# Patient Record
Sex: Male | Born: 1985
Health system: Southern US, Community
[De-identification: ages and names within clinical notes are randomized; demographics above are authoritative.]

## PROBLEM LIST (undated history)

## (undated) DIAGNOSIS — I10 Essential (primary) hypertension: Secondary | ICD-10-CM

---

## 2008-02-12 ENCOUNTER — Ambulatory Visit: Payer: Self-pay | Admitting: General Practice

## 2012-04-12 ENCOUNTER — Ambulatory Visit: Payer: Self-pay | Admitting: General Practice

## 2014-07-30 ENCOUNTER — Encounter: Payer: Self-pay | Admitting: Emergency Medicine

## 2014-07-30 DIAGNOSIS — M549 Dorsalgia, unspecified: Secondary | ICD-10-CM | POA: Diagnosis not present

## 2014-07-30 DIAGNOSIS — R109 Unspecified abdominal pain: Secondary | ICD-10-CM | POA: Diagnosis present

## 2014-07-30 DIAGNOSIS — N200 Calculus of kidney: Secondary | ICD-10-CM | POA: Diagnosis not present

## 2014-07-30 LAB — URINALYSIS COMPLETE WITH MICROSCOPIC (ARMC ONLY)
Bacteria, UA: NONE SEEN
Bilirubin Urine: NEGATIVE
Glucose, UA: NEGATIVE mg/dL
Ketones, ur: NEGATIVE mg/dL
Leukocytes, UA: NEGATIVE
NITRITE: NEGATIVE
PROTEIN: NEGATIVE mg/dL
Specific Gravity, Urine: 1.018 (ref 1.005–1.030)
Squamous Epithelial / LPF: NONE SEEN
pH: 7 (ref 5.0–8.0)

## 2014-07-30 NOTE — ED Notes (Signed)
Pt ambulatory to triage, with no difficulty or distress noted; Pt reports awoke with pain to left lower back, nonradiating with no accomp symptoms; denies any injury; denies hx of same

## 2014-07-31 ENCOUNTER — Emergency Department
Admission: EM | Admit: 2014-07-31 | Discharge: 2014-07-31 | Disposition: A | Discharge status: Home / Self Care | Payer: BLUE CROSS/BLUE SHIELD | Attending: Emergency Medicine | Admitting: Emergency Medicine

## 2014-07-31 ENCOUNTER — Encounter: Payer: Self-pay | Admitting: Emergency Medicine

## 2014-07-31 ENCOUNTER — Emergency Department: Payer: BLUE CROSS/BLUE SHIELD

## 2014-07-31 DIAGNOSIS — R109 Unspecified abdominal pain: Secondary | ICD-10-CM

## 2014-07-31 DIAGNOSIS — N200 Calculus of kidney: Secondary | ICD-10-CM

## 2014-07-31 LAB — BASIC METABOLIC PANEL
Anion gap: 8 (ref 5–15)
BUN: 19 mg/dL (ref 6–20)
CALCIUM: 8.8 mg/dL — AB (ref 8.9–10.3)
CO2: 26 mmol/L (ref 22–32)
Chloride: 103 mmol/L (ref 101–111)
Creatinine, Ser: 1.17 mg/dL (ref 0.61–1.24)
GFR calc non Af Amer: >60 mL/min (ref >60)
Glucose, Bld: 94 mg/dL (ref 65–99)
POTASSIUM: 3.5 mmol/L (ref 3.5–5.1)
Sodium: 137 mmol/L (ref 135–145)

## 2014-07-31 LAB — CBC
HCT: 43.2 % (ref 40.0–52.0)
Hemoglobin: 14.5 g/dL (ref 13.0–18.0)
MCH: 31.2 pg (ref 26.0–34.0)
MCHC: 33.5 g/dL (ref 32.0–36.0)
MCV: 93.1 fL (ref 80.0–100.0)
Platelets: 253 10*3/uL (ref 150–440)
RBC: 4.65 MIL/uL (ref 4.40–5.90)
RDW: 12.3 % (ref 11.5–14.5)
WBC: 6.3 10*3/uL (ref 3.8–10.6)

## 2014-07-31 MED ORDER — TAMSULOSIN HCL 0.4 MG PO CAPS: 0.4000 mg | ORAL_CAPSULE | Freq: Every day | ORAL | Status: DC

## 2014-07-31 MED ORDER — TAMSULOSIN HCL 0.4 MG PO CAPS
ORAL_CAPSULE | ORAL | Status: AC
  Administered 2014-07-31: 0.4 mg via ORAL
  Filled 2014-07-31: qty 1

## 2014-07-31 MED ORDER — KETOROLAC TROMETHAMINE 10 MG PO TABS: 10.0000 mg | ORAL_TABLET | Freq: Three times a day (TID) | ORAL | Status: DC | PRN

## 2014-07-31 MED ORDER — KETOROLAC TROMETHAMINE 10 MG PO TABS
10.0000 mg | ORAL_TABLET | Freq: Once | ORAL | Status: AC
  Administered 2014-07-31: 10 mg via ORAL

## 2014-07-31 MED ORDER — KETOROLAC TROMETHAMINE 10 MG PO TABS
ORAL_TABLET | ORAL | Status: AC
  Administered 2014-07-31: 10 mg via ORAL
  Filled 2014-07-31: qty 1

## 2014-07-31 NOTE — Discharge Instructions (Signed)

## 2014-07-31 NOTE — ED Provider Notes (Signed)
[facility] Emergency Department Provider Note  ____________________________________________  Time seen: 3:00PM  I have reviewed the triage vital signs and the nursing notes.   HISTORY  Chief Complaint Back Pain      HPI Jeffrey Maynard is a 29 y.o. male   presents with acute onset of left flank pain that awoke him from sleep tonight. Patient denies no vomiting no diarrhea no fever. Of note patient has no history of kidney stones in the past. Patient also denies hematuria. Patient pain-free at present     History reviewed. No pertinent past medical history.  There are no active problems to display for this patient.   History reviewed. No pertinent past surgical history.  No current outpatient prescriptions on file.  Allergies Review of patient's allergies indicates no known allergies.  No family history on file.  Social History History  Substance Use Topics  . Smoking status: Never Smoker   . Smokeless tobacco: Not on file  . Alcohol Use: No    Review of Systems  Constitutional: Negative for fever. Eyes: Negative for visual changes. ENT: Negative for sore throat. Cardiovascular: Negative for chest pain. Respiratory: Negative for shortness of breath. Gastrointestinal: Negative for abdominal pain, vomiting and diarrhea. Genitourinary: Negative for dysuria. Musculoskeletal: Positive for left back pain. Skin: Negative for rash. Neurological: Negative for headaches, focal weakness or numbness.   10-point ROS otherwise negative.  ____________________________________________   PHYSICAL EXAM:  VITAL SIGNS: ED Triage Vitals  Enc Vitals Group     BP 07/30/14 2331 143/97 mmHg     Pulse Rate 07/30/14 2331 56     Resp 07/30/14 2331 20     Temp 07/30/14 2331 98 F (36.7 C)     Temp src --      SpO2 07/31/14 0230 100 %     Weight 07/30/14 2331 195 lb (88.451 kg)     Height 07/30/14 2331 5\' 9"  (1.753 m)     Head Cir --    Peak Flow --      Pain Score 07/30/14 2332 6     Pain Loc --      Pain Edu? --      Excl. in [place]? --      Constitutional: Alert and oriented. Well appearing and in no distress. Eyes: Conjunctivae are normal. PERRL. Normal extraocular movements. ENT   Head: Normocephalic and atraumatic.   Nose: No congestion/rhinnorhea.   Mouth/Throat: Mucous membranes are moist.   Neck: No stridor. Cardiovascular: Normal rate, regular rhythm. Normal and symmetric distal pulses are present in all extremities. No murmurs, rubs, or gallops. Respiratory: Normal respiratory effort without tachypnea nor retractions. Breath sounds are clear and equal bilaterally. No wheezes/rales/rhonchi. Gastrointestinal: Soft and nontender. No distention. There is no CVA tenderness. Genitourinary: deferred Musculoskeletal: Nontender with normal range of motion in all extremities. No joint effusions.  No lower extremity tenderness nor edema. Neurologic:  Normal speech and language. No gross focal neurologic deficits are appreciated. Speech is normal.  Skin:  Skin is warm, dry and intact. No rash noted. Psychiatric: Mood and affect are normal. Speech and behavior are normal. Patient exhibits appropriate insight and judgment.  ____________________________________________    LABS (pertinent positives/negatives)  Labs Reviewed  URINALYSIS COMPLETEWITH MICROSCOPIC ([place] ONLY) - Abnormal; Notable for the following:    Color, Urine YELLOW (*)    APPearance CLEAR (*)    Hgb urine dipstick 2+ (*)    All other components within normal limits  BASIC METABOLIC PANEL -  Abnormal; Notable for the following:    Calcium 8.8 (*)    All other components within normal limits  CBC       RADIOLOGY CT scan revealed a 3 mm left UPJ kidney stone  ____________________________________________      INITIAL IMPRESSION / ASSESSMENT AND PLAN / ED COURSE  Pertinent labs & imaging results that were available during my  care of the patient were reviewed by me and considered in my medical decision making (see chart for details).  History,  physical exam and CT scan consistent with left UPJ kidney stone patient received Toradol and Flomax in the emergency department patient pain-free at present  ____________________________________________   FINAL CLINICAL IMPRESSION(S) / ED DIAGNOSES  Final diagnoses:  Flank pain      Gregor Hams, MD 07/31/14 [phone]

## 2014-07-31 NOTE — ED Notes (Signed)
Noted UA results; Dr Dahlia Client notified and further orders obtained; acuity level changed

## 2019-05-21 ENCOUNTER — Inpatient Hospital Stay: Payer: 59

## 2019-05-21 ENCOUNTER — Other Ambulatory Visit: Payer: Self-pay

## 2019-05-21 ENCOUNTER — Emergency Department: Payer: 59

## 2019-05-21 ENCOUNTER — Encounter: Payer: Self-pay | Admitting: Emergency Medicine

## 2019-05-21 ENCOUNTER — Inpatient Hospital Stay
Admission: EM | Admit: 2019-05-21 | Discharge: 2019-05-25 | DRG: 673 | Disposition: A | Discharge status: Home / Self Care | Payer: 59 | Source: Ambulatory Visit | Attending: Internal Medicine | Admitting: Internal Medicine

## 2019-05-21 DIAGNOSIS — I120 Hypertensive chronic kidney disease with stage 5 chronic kidney disease or end stage renal disease: Principal | ICD-10-CM | POA: Diagnosis present

## 2019-05-21 DIAGNOSIS — I161 Hypertensive emergency: Secondary | ICD-10-CM | POA: Diagnosis not present

## 2019-05-21 DIAGNOSIS — R319 Hematuria, unspecified: Secondary | ICD-10-CM

## 2019-05-21 DIAGNOSIS — E876 Hypokalemia: Secondary | ICD-10-CM | POA: Diagnosis not present

## 2019-05-21 DIAGNOSIS — Z20822 Contact with and (suspected) exposure to covid-19: Secondary | ICD-10-CM | POA: Diagnosis present

## 2019-05-21 DIAGNOSIS — D631 Anemia in chronic kidney disease: Secondary | ICD-10-CM | POA: Diagnosis not present

## 2019-05-21 DIAGNOSIS — R778 Other specified abnormalities of plasma proteins: Secondary | ICD-10-CM | POA: Diagnosis not present

## 2019-05-21 DIAGNOSIS — Z825 Family history of asthma and other chronic lower respiratory diseases: Secondary | ICD-10-CM | POA: Diagnosis not present

## 2019-05-21 DIAGNOSIS — F419 Anxiety disorder, unspecified: Secondary | ICD-10-CM | POA: Diagnosis present

## 2019-05-21 DIAGNOSIS — G4700 Insomnia, unspecified: Secondary | ICD-10-CM | POA: Diagnosis present

## 2019-05-21 DIAGNOSIS — N186 End stage renal disease: Secondary | ICD-10-CM | POA: Diagnosis not present

## 2019-05-21 DIAGNOSIS — Z992 Dependence on renal dialysis: Secondary | ICD-10-CM | POA: Diagnosis not present

## 2019-05-21 DIAGNOSIS — N200 Calculus of kidney: Secondary | ICD-10-CM | POA: Diagnosis present

## 2019-05-21 DIAGNOSIS — I10 Essential (primary) hypertension: Secondary | ICD-10-CM | POA: Diagnosis not present

## 2019-05-21 DIAGNOSIS — R9431 Abnormal electrocardiogram [ECG] [EKG]: Secondary | ICD-10-CM | POA: Diagnosis not present

## 2019-05-21 DIAGNOSIS — N179 Acute kidney failure, unspecified: Secondary | ICD-10-CM | POA: Diagnosis not present

## 2019-05-21 DIAGNOSIS — I248 Other forms of acute ischemic heart disease: Secondary | ICD-10-CM | POA: Diagnosis present

## 2019-05-21 DIAGNOSIS — E871 Hypo-osmolality and hyponatremia: Secondary | ICD-10-CM | POA: Diagnosis present

## 2019-05-21 DIAGNOSIS — R809 Proteinuria, unspecified: Secondary | ICD-10-CM | POA: Diagnosis not present

## 2019-05-21 DIAGNOSIS — D696 Thrombocytopenia, unspecified: Secondary | ICD-10-CM

## 2019-05-21 DIAGNOSIS — N2581 Secondary hyperparathyroidism of renal origin: Secondary | ICD-10-CM | POA: Diagnosis not present

## 2019-05-21 DIAGNOSIS — R030 Elevated blood-pressure reading, without diagnosis of hypertension: Secondary | ICD-10-CM | POA: Diagnosis not present

## 2019-05-21 DIAGNOSIS — R519 Headache, unspecified: Secondary | ICD-10-CM | POA: Diagnosis not present

## 2019-05-21 HISTORY — DX: Essential (primary) hypertension: I10

## 2019-05-21 LAB — BASIC METABOLIC PANEL
Anion gap: 13 (ref 5–15)
BUN: 52 mg/dL — ABNORMAL HIGH (ref 6–20)
CO2: 25 mmol/L (ref 22–32)
Calcium: 8.9 mg/dL (ref 8.9–10.3)
Chloride: 96 mmol/L — ABNORMAL LOW (ref 98–111)
Creatinine, Ser: 5.23 mg/dL — ABNORMAL HIGH (ref 0.61–1.24)
GFR calc Af Amer: 16 mL/min — ABNORMAL LOW (ref >60)
GFR calc non Af Amer: 13 mL/min — ABNORMAL LOW (ref >60)
Glucose, Bld: 109 mg/dL — ABNORMAL HIGH (ref 70–99)
Potassium: 2.6 mmol/L — CL (ref 3.5–5.1)
Sodium: 134 mmol/L — ABNORMAL LOW (ref 135–145)

## 2019-05-21 LAB — SODIUM, URINE, RANDOM: Sodium, Ur: 77 mmol/L

## 2019-05-21 LAB — URINALYSIS, ROUTINE W REFLEX MICROSCOPIC
Bilirubin Urine: NEGATIVE
Glucose, UA: 50 mg/dL — AB
Ketones, ur: NEGATIVE mg/dL
Leukocytes,Ua: NEGATIVE
Nitrite: NEGATIVE
Protein, ur: >=300 mg/dL — AB
Specific Gravity, Urine: 1.015 (ref 1.005–1.030)
pH: 6 (ref 5.0–8.0)

## 2019-05-21 LAB — CBC
HCT: 30.5 % — ABNORMAL LOW (ref 39.0–52.0)
Hemoglobin: 10.8 g/dL — ABNORMAL LOW (ref 13.0–17.0)
MCH: 30.7 pg (ref 26.0–34.0)
MCHC: 35.4 g/dL (ref 30.0–36.0)
MCV: 86.6 fL (ref 80.0–100.0)
Platelets: 68 10*3/uL — ABNORMAL LOW (ref 150–400)
RBC: 3.52 MIL/uL — ABNORMAL LOW (ref 4.22–5.81)
RDW: 13.4 % (ref 11.5–15.5)
WBC: 10.3 10*3/uL (ref 4.0–10.5)
nRBC: 0 % (ref 0.0–0.2)

## 2019-05-21 LAB — HIV ANTIBODY (ROUTINE TESTING W REFLEX): HIV Screen 4th Generation wRfx: NONREACTIVE

## 2019-05-21 LAB — HEPATITIS C ANTIBODY: HCV Ab: NONREACTIVE

## 2019-05-21 LAB — URINE DRUG SCREEN, QUALITATIVE (ARMC ONLY)
Amphetamines, Ur Screen: NOT DETECTED
Barbiturates, Ur Screen: NOT DETECTED
Benzodiazepine, Ur Scrn: NOT DETECTED
Cannabinoid 50 Ng, Ur ~~LOC~~: NOT DETECTED
Cocaine Metabolite,Ur ~~LOC~~: NOT DETECTED
MDMA (Ecstasy)Ur Screen: NOT DETECTED
Methadone Scn, Ur: NOT DETECTED
Opiate, Ur Screen: NOT DETECTED
Phencyclidine (PCP) Ur S: NOT DETECTED
Tricyclic, Ur Screen: NOT DETECTED

## 2019-05-21 LAB — PROTEIN / CREATININE RATIO, URINE
Creatinine, Urine: 177 mg/dL
Protein Creatinine Ratio: 2.88 mg/mg{Cre} — ABNORMAL HIGH (ref 0.00–0.15)
Total Protein, Urine: 510 mg/dL

## 2019-05-21 LAB — TROPONIN I (HIGH SENSITIVITY)
Troponin I (High Sensitivity): 229 ng/L (ref <18)
Troponin I (High Sensitivity): 212 ng/L (ref <18)

## 2019-05-21 LAB — PROTIME-INR
INR: 1 (ref 0.8–1.2)
Prothrombin Time: 13.4 seconds (ref 11.4–15.2)

## 2019-05-21 LAB — OSMOLALITY: Osmolality: 297 mOsm/kg — ABNORMAL HIGH (ref 275–295)

## 2019-05-21 LAB — MAGNESIUM: Magnesium: 2.1 mg/dL (ref 1.7–2.4)

## 2019-05-21 LAB — CK: Total CK: 540 U/L — ABNORMAL HIGH (ref 49–397)

## 2019-05-21 LAB — PHOSPHORUS: Phosphorus: 4.4 mg/dL (ref 2.5–4.6)

## 2019-05-21 LAB — HEPATITIS B SURFACE ANTIGEN: Hepatitis B Surface Ag: NONREACTIVE

## 2019-05-21 LAB — OSMOLALITY, URINE: Osmolality, Ur: 498 mOsm/kg (ref 300–900)

## 2019-05-21 LAB — SARS CORONAVIRUS 2 (TAT 6-24 HRS): SARS Coronavirus 2: NEGATIVE

## 2019-05-21 MED ORDER — ONDANSETRON HCL 4 MG/2ML IJ SOLN
4.0000 mg | Freq: Four times a day (QID) | INTRAMUSCULAR | Status: DC | PRN
  Administered 2019-05-22 – 2019-05-24 (×3): 4 mg via INTRAVENOUS
  Filled 2019-05-21 (×2): qty 2

## 2019-05-21 MED ORDER — ACETAMINOPHEN 325 MG PO TABS
650.0000 mg | ORAL_TABLET | Freq: Four times a day (QID) | ORAL | Status: DC | PRN
  Administered 2019-05-24: 650 mg via ORAL
  Filled 2019-05-21: qty 2

## 2019-05-21 MED ORDER — ONDANSETRON HCL 4 MG PO TABS: 4.0000 mg | ORAL_TABLET | Freq: Four times a day (QID) | ORAL | Status: DC | PRN

## 2019-05-21 MED ORDER — POTASSIUM CHLORIDE 20 MEQ PO PACK
40.0000 meq | PACK | Freq: Once | ORAL | Status: AC
  Administered 2019-05-21: 40 meq via ORAL
  Filled 2019-05-21: qty 2

## 2019-05-21 MED ORDER — SODIUM CHLORIDE 0.9% FLUSH
3.0000 mL | Freq: Two times a day (BID) | INTRAVENOUS | Status: DC
  Administered 2019-05-21 – 2019-05-24 (×6): 3 mL via INTRAVENOUS

## 2019-05-21 MED ORDER — ASPIRIN EC 81 MG PO TBEC
81.0000 mg | DELAYED_RELEASE_TABLET | Freq: Once | ORAL | Status: DC
  Filled 2019-05-21: qty 1

## 2019-05-21 MED ORDER — SODIUM CHLORIDE 0.9% FLUSH
3.0000 mL | Freq: Once | INTRAVENOUS | Status: AC
  Administered 2019-05-21: 3 mL via INTRAVENOUS

## 2019-05-21 MED ORDER — ACETAMINOPHEN 650 MG RE SUPP: 650.0000 mg | Freq: Four times a day (QID) | RECTAL | Status: DC | PRN

## 2019-05-21 MED ORDER — NICARDIPINE HCL IN NACL 20-0.86 MG/200ML-% IV SOLN
3.0000 mg/h | INTRAVENOUS | Status: DC
  Administered 2019-05-21 – 2019-05-22 (×2): 5 mg/h via INTRAVENOUS
  Filled 2019-05-21 (×5): qty 200

## 2019-05-21 NOTE — Consult Note (Signed)
[facility]  CONSULT NOTE    Date: 05/21/2019                  Patient Name:  Jeffrey Maynard  MRN: [id number]  DOB: 1986-01-29  Age / Sex: 34 y.o., male         PCP: System, Pcp Not In                 Service Requesting Consult: Dr. Jacqualine Code                 Reason for Consult: Acute renal failure            History of Present Illness: Jeffrey Maynard presents to ED with headache. He was at urgent care where he was found to have hypertension and asked to present to the ED. Patient states his mother passed away two weeks ago. Patient states he has been having blurry vision. Denies any drugs.   Patient has intermittently taken some NSAIDs.    Medications: Outpatient medications: (Not in a hospital admission)   Current medications: No current facility-administered medications for this encounter.   Current Outpatient Medications  Medication Sig Dispense Refill  . ipratropium (ATROVENT) 0.06 % nasal spray Place 2 sprays into both nostrils 2 (two) times daily.    . montelukast (SINGULAIR) 10 MG tablet Take 10 mg by mouth daily.    Marland Kitchen ketorolac (TORADOL) 10 MG tablet Take 1 tablet (10 mg total) by mouth every 8 (eight) hours as needed. (Patient not taking: Reported on 05/21/2019) 20 tablet 0  . tamsulosin (FLOMAX) 0.4 MG CAPS capsule Take 1 capsule (0.4 mg total) by mouth daily. (Patient not taking: Reported on 05/21/2019) 5 capsule 0      Allergies: No Known Allergies    Past Medical History: Past Medical History:  Diagnosis Date  . Hypertension      Past Surgical History: History reviewed. No pertinent surgical history.   Family History: No family history on file.   Social History: Social History   Socioeconomic History  . Marital status: Married    Spouse name: Not on file  . Number of children: Not on file  . Years of education: Not on file  . Highest education level: Not on file  Occupational History  . Not on file  Tobacco  Use  . Smoking status: Never Smoker  . Smokeless tobacco: Never Used  Substance and Sexual Activity  . Alcohol use: No  . Drug use: Never  . Sexual activity: Not on file  Other Topics Concern  . Not on file  Social History Narrative  . Not on file   Social Determinants of Health   Financial Resource Strain:   . Difficulty of Paying Living Expenses:   Food Insecurity:   . Worried About Charity fundraiser in the Last Year:   . Arboriculturist in the Last Year:   Transportation Needs:   . Film/video editor (Medical):   Marland Kitchen Lack of Transportation (Non-Medical):   Physical Activity:   . Days of Exercise per Week:   . Minutes of Exercise per Session:   Stress:   . Feeling of Stress :   Social Connections:   . Frequency of Communication with Friends and Family:   . Frequency of Social Gatherings with Friends and Family:   . Attends Religious Services:   . Active Member of Clubs or Organizations:   . Attends Archivist Meetings:   .  Marital Status:   Intimate Partner Violence:   . Fear of Current or Ex-Partner:   . Emotionally Abused:   Marland Kitchen Physically Abused:   . Sexually Abused:      Review of Systems: Review of Systems  Constitutional: Negative.  Negative for chills, diaphoresis, fever, malaise/fatigue and weight loss.  HENT: Negative.  Negative for congestion, ear discharge, ear pain, hearing loss, nosebleeds, sinus pain, sore throat and tinnitus.   Eyes: Positive for blurred vision. Negative for double vision, photophobia, pain, discharge and redness.  Respiratory: Negative.  Negative for cough, hemoptysis, sputum production, shortness of breath, wheezing and stridor.   Cardiovascular: Negative.  Negative for chest pain, palpitations, orthopnea, claudication, leg swelling and PND.  Gastrointestinal: Negative.  Negative for abdominal pain, blood in stool, constipation, diarrhea, heartburn, melena, nausea and vomiting.  Genitourinary: Negative.  Negative for  dysuria, flank pain, frequency, hematuria and urgency.  Musculoskeletal: Negative.  Negative for back pain, falls, joint pain, myalgias and neck pain.  Skin: Negative.  Negative for itching and rash.  Neurological: Positive for headaches. Negative for dizziness, tingling, tremors, sensory change, speech change, focal weakness, seizures, loss of consciousness and weakness.  Endo/Heme/Allergies: Negative.  Negative for environmental allergies and polydipsia. Does not bruise/bleed easily.  Psychiatric/Behavioral: Negative.  Negative for depression, hallucinations, memory loss, substance abuse and suicidal ideas. The patient is not nervous/anxious and does not have insomnia.     Vital Signs: Blood pressure (!) 220/146, pulse 92, temperature 98.5 F (36.9 C), temperature source Oral, resp. rate 18, height 5\' 8"  (1.727 m), weight 81.6 kg, SpO2 100 %.  Weight trends: Filed Weights   05/21/19 1634  Weight: 81.6 kg    Physical Exam: General: NAD,   Head: Normocephalic, atraumatic. Moist oral mucosal membranes  Eyes: Anicteric, PERRL  Neck: Supple, trachea midline  Lungs:  Clear to auscultation  Heart: tachycardia  Abdomen:  Soft, nontender,   Extremities: no peripheral edema.  Neurologic: Nonfocal, moving all four extremities  Skin: No lesions        Lab results: Basic Metabolic Panel: Recent Labs  Lab 05/21/19 1650  NA 134*  K 2.6*  CL 96*  CO2 25  GLUCOSE 109*  BUN 52*  CREATININE 5.23*  CALCIUM 8.9    Liver Function Tests: No results for input(s): AST, ALT, ALKPHOS, BILITOT, PROT, ALBUMIN in the last 168 hours. No results for input(s): LIPASE, AMYLASE in the last 168 hours. No results for input(s): AMMONIA in the last 168 hours.  CBC: Recent Labs  Lab 05/21/19 1650  WBC 10.3  HGB 10.8*  HCT 30.5*  MCV 86.6  PLT 68*    Cardiac Enzymes: No results for input(s): CKTOTAL, CKMB, CKMBINDEX, TROPONINI in the last 168 hours.  BNP: Invalid input(s):  POCBNP  CBG: No results for input(s): GLUCAP in the last 168 hours.  Microbiology: No results found for this or any previous visit.  Coagulation Studies: No results for input(s): LABPROT, INR in the last 72 hours.  Urinalysis: No results for input(s): COLORURINE, LABSPEC, PHURINE, GLUCOSEU, HGBUR, BILIRUBINUR, KETONESUR, PROTEINUR, UROBILINOGEN, NITRITE, LEUKOCYTESUR in the last 72 hours.  Invalid input(s): APPERANCEUR    Imaging: DG Chest 2 View  Result Date: 05/21/2019 CLINICAL DATA:  Hypertension EXAM: CHEST - 2 VIEW COMPARISON:  None. FINDINGS: The heart size and mediastinal contours are within normal limits. Both lungs are clear. The visualized skeletal structures are unremarkable. IMPRESSION: Normal study. Electronically Signed   By: Rolm Baptise M.D.   On: 05/21/2019 17:28  CT Head Wo Contrast  Result Date: 05/21/2019 CLINICAL DATA:  Headache EXAM: CT HEAD WITHOUT CONTRAST TECHNIQUE: Contiguous axial images were obtained from the base of the skull through the vertex without intravenous contrast. COMPARISON:  None. FINDINGS: Brain: No acute intracranial abnormality. Specifically, no hemorrhage, hydrocephalus, mass lesion, acute infarction, or significant intracranial injury. Vascular: No hyperdense vessel or unexpected calcification. Skull: No acute calvarial abnormality. Sinuses/Orbits: Visualized paranasal sinuses and mastoids clear. Orbital soft tissues unremarkable. Other: None IMPRESSION: Normal study. Electronically Signed   By: Rolm Baptise M.D.   On: 05/21/2019 17:54      Assessment & Plan: Jeffrey Maynard is a 34 y.o. black male with no significant past medical history, who was admitted to [facility] on 05/21/2019 for sent by dr hb pressure  1. Acute renal failure: baseline creatinine of 1.17, GFR was normal on 07/31/2014.  2. Hematuria: with history of nephrolithiasis in 2016.  2. Hypertensive emergency 3. Hypokalemia 4. Anemia 5. Hyponatremia 6.  Thrombocytopenia  Patient will need full work up for early onset hypertension and for renal failure. Differential is large however concerning for FSGS (focal segmental glomerulosclerosis) - Start IV nicardipine to titrate blood pressure down - check renal ultrasound - Check serologic and CKD work up. Patient may need a renal biopsy on this admission   LOS: 0 Katheen Aslin 4/5/20216:44 PM

## 2019-05-21 NOTE — ED Provider Notes (Signed)
[facility] Emergency Department Provider Note   ____________________________________________   First MD Initiated Contact with Patient 05/21/19 1739     (approximate)  I have reviewed the triage vital signs and the nursing notes.   HISTORY  Chief Complaint Hypertension    HPI Jeffrey Maynard is a 34 y.o. male here for evaluation after he had a negative Covid test for high blood pressure  Patient here for evaluation for high blood pressure.  He states has been having headache for about 2 weeks now, and he said to Covid test now because of it because is not sure what is causing the headache.  He went to urgent care today, his test he reports is negative, and they told him to come here because his blood pressure was severely elevated  Patient reports he had a throbbing headache intermittently.  No numbness or weakness.  Used to work out regular.  Has no prior obvious medical history.  Mother died 2 weeks ago   Past Medical History:  Diagnosis Date  . Hypertension     There are no problems to display for this patient.   History reviewed. No pertinent surgical history.   Allergies Patient has no known allergies.  No family history on file.  Social History Social History   Tobacco Use  . Smoking status: Never Smoker  . Smokeless tobacco: Never Used  Substance Use Topics  . Alcohol use: No  . Drug use: Never    Review of Systems Constitutional: No fever/chills Eyes: Some blurring of his vision at times ENT: No sore throat. Cardiovascular: Denies chest pain. Respiratory: Denies shortness of breath. Gastrointestinal: No abdominal pain.   Genitourinary: Negative for dysuria. Musculoskeletal: Negative for back pain. Skin: Negative for rash. Neurological: Negative for areas of focal weakness or numbness.    ____________________________________________   PHYSICAL EXAM:  VITAL SIGNS: ED Triage Vitals  Enc Vitals Group     BP  05/21/19 1630 (!) 246/153     Pulse Rate 05/21/19 1630 91     Resp 05/21/19 1747 18     Temp 05/21/19 1630 98.5 F (36.9 C)     Temp Source 05/21/19 1630 Oral     SpO2 05/21/19 1630 99 %     Weight 05/21/19 1634 180 lb (81.6 kg)     Height 05/21/19 1634 5\' 8"  (1.727 m)     Head Circumference --      Peak Flow --      Pain Score 05/21/19 1640 4     Pain Loc --      Pain Edu? --      Excl. in [place]? --     Constitutional: Alert and oriented. Well appearing and in no acute distress.  Very pleasant.  Well-built.  Sitting upright without distress. Eyes: Conjunctivae are normal. Head: Atraumatic. Nose: No congestion/rhinnorhea. Mouth/Throat: Mucous membranes are moist. Neck: No stridor.  Cardiovascular: Normal rate, regular rhythm. Grossly normal heart sounds.  Good peripheral circulation. Respiratory: Normal respiratory effort.  No retractions. Lungs CTAB. Gastrointestinal: Soft and nontender. No distention. Musculoskeletal: No lower extremity tenderness nor edema. Neurologic:  Normal speech and language. No gross focal neurologic deficits are appreciated.  Skin:  Skin is warm, dry and intact. No rash noted. Psychiatric: Mood and affect are normal. Speech and behavior are normal.  ____________________________________________   LABS (all labs ordered are listed, but only abnormal results are displayed)  Labs Reviewed  BASIC METABOLIC PANEL - Abnormal; Notable for the following components:  Result Value   Sodium 134 (*)    Potassium 2.6 (*)    Chloride 96 (*)    Glucose, Bld 109 (*)    BUN 52 (*)    Creatinine, Ser 5.23 (*)    GFR calc non Af Amer 13 (*)    GFR calc Af Amer 16 (*)    All other components within normal limits  TROPONIN I (HIGH SENSITIVITY) - Abnormal; Notable for the following components:   Troponin I (High Sensitivity) 229 (*)    All other components within normal limits  CBC  URINE DRUG SCREEN, QUALITATIVE ([facility] ONLY)    ____________________________________________  EKG  Reviewed inter by me at 1645 Heart rate 99 QRS 99 QTc 470 Normal sinus rhythm, diffuse ST abnormality in inferolateral distribution.  No STEMI ____________________________________________  RADIOLOGY  DG Chest 2 View  Result Date: 05/21/2019 CLINICAL DATA:  Hypertension EXAM: CHEST - 2 VIEW COMPARISON:  None. FINDINGS: The heart size and mediastinal contours are within normal limits. Both lungs are clear. The visualized skeletal structures are unremarkable. IMPRESSION: Normal study. Electronically Signed   By: Rolm Baptise M.D.   On: 05/21/2019 17:28   CT Head Wo Contrast  Result Date: 05/21/2019 CLINICAL DATA:  Headache EXAM: CT HEAD WITHOUT CONTRAST TECHNIQUE: Contiguous axial images were obtained from the base of the skull through the vertex without intravenous contrast. COMPARISON:  None. FINDINGS: Brain: No acute intracranial abnormality. Specifically, no hemorrhage, hydrocephalus, mass lesion, acute infarction, or significant intracranial injury. Vascular: No hyperdense vessel or unexpected calcification. Skull: No acute calvarial abnormality. Sinuses/Orbits: Visualized paranasal sinuses and mastoids clear. Orbital soft tissues unremarkable. Other: None IMPRESSION: Normal study. Electronically Signed   By: Rolm Baptise M.D.   On: 05/21/2019 17:54    Imaging reviewed negative for acute on head or chest x-ray ____________________________________________   PROCEDURES  Procedure(s) performed: None  Procedures  Critical Care performed: Yes, see critical care note(s)  CRITICAL CARE Performed by: Delman Kitten   Total critical care time: 25 minutes  Critical care time was exclusive of separately billable procedures and treating other patients.  Critical care was necessary to treat or prevent imminent or life-threatening deterioration.  Critical care was time spent personally by me on the following activities: development of  treatment plan with patient and/or surrogate as well as nursing, discussions with consultants, evaluation of patient's response to treatment, examination of patient, obtaining history from patient or surrogate, ordering and performing treatments and interventions, ordering and review of laboratory studies, ordering and review of radiographic studies, pulse oximetry and re-evaluation of patient's condition.  ____________________________________________   INITIAL IMPRESSION / ASSESSMENT AND PLAN / ED COURSE  Pertinent labs & imaging results that were available during my care of the patient were reviewed by me and considered in my medical decision making (see chart for details).   Patient presents for severely elevated blood pressure associated with throbbing headache.  Head CT negative no evidence of ICH.  He does have diffuse T wave abnormality on his EKG with elevated troponin, but denies chest pain or difficulty breathing.  Reports symptoms ongoing for about 2 weeks and is tested twice now for Covid due to the headache, both of which she reports is negative  He has severely elevated creatinine, severe hypertension including severely elevated diastolic pressures.  He appears overall nontoxic and well-appearing, but meets criteria for hypertensive emergency.  I spoke with Dr. Juleen China of nephrology who will see the patient, anticipate likely need to start him on nicardipine  infusion  Etiology somewhat unclear, he certainly does seem to have evidence of hypertensive emergency.  He will be admitted to the hospital for further work-up and care.  ----------------------------------------- 6:10 PM on 05/21/2019 -----------------------------------------  Discussed with Dr. Juleen China, also with Dr. Posey Pronto of the hospitalist service.  Patient being admitted, antihypertensive being ordered by Dr. Frances Maywood      ____________________________________________   FINAL CLINICAL IMPRESSION(S) / ED  DIAGNOSES  Final diagnoses:  Hypertensive emergency without congestive heart failure        Note:  This document was prepared using Dragon voice recognition software and may include unintentional dictation errors       Delman Kitten, MD 05/21/19 1910

## 2019-05-21 NOTE — ED Notes (Signed)
Support person at bedside.

## 2019-05-21 NOTE — ED Triage Notes (Signed)
Pt presents to ED via POV, pt states went to [facility] for Covid test and they checked his BP, pt states was referred for HTN. Pt states previously dx with HTN, but is not on any medications. Pt denies any symptoms except "a little HA". A&O x4, NAD noted at this time.

## 2019-05-21 NOTE — ED Notes (Signed)
Consulting nephro staff at bedside.

## 2019-05-21 NOTE — ED Notes (Signed)
Called ICU again. State room still on hold. ER Charge nurse aware.

## 2019-05-21 NOTE — H&P (Signed)
History and Physical    Jeffrey Maynard:[id number] DOB: 06-May-1985 DOA: 05/21/2019  PCP: System, Pcp Not In  Patient coming from: Urgent care  I have personally briefly reviewed patient's old medical records in [place]  Chief Complaint: Elevated blood pressure  HPI: Jeffrey Maynard is a 34 y.o. male with medical history significant for hypertension and kidney stone who presents to the ED from urgent care for evaluation of severe hypertension.  Patient states that unfortunately his mother passed away about 2 weeks ago from complications of COPD and COVID-19 viral infection.  He says over the last 2 weeks he has been having intermittent left temporal headache described as a quick sharp discomfort.  He has had some associated blurry vision and a couple days ago had nausea with emesis.  He went to urgent care today to get tested for COVID-19 pneumonia which was reportedly negative but he was noted to have significantly elevated blood pressure was therefore advised to present to the ED for further evaluation.  He otherwise denies any focal deficits, chest pain, dyspnea, cough, abdominal pain, dysuria, diarrhea, or peripheral edema.  He reports continuing to make good urine output.  He denies any medication use other than as needed Tylenol.  He denies any history of tobacco use, alcohol, or illicit drug use.  He is not aware of any kidney disease in his blood relatives.  ED Course:  Initial vitals showed BP 226/153, pulse 91, RR 18, temp 98.5 Fahrenheit, SPO2 99% on room air.  Labs are notable for BUN 52, creatinine 5.23, potassium 2.6, sodium 134, bicarb 25, chloride 96, serum glucose 109, high-sensitivity troponin I 229. WBC 10.3, hemoglobin 10.8, platelets 68,000.  2 view chest x-ray was negative for focal consolidation, edema, or effusion.  CT head without contrast was negative for acute cranial abnormality, hemorrhage, hydrocephalus, mass lesion, acute infarct, or  intracranial injury.  Nephrology were consulted and recommended admission to stepdown to start IV nicardipine for blood pressure control and further evaluation of renal failure.  The hospitalist service was consulted to admit for further evaluation management.  Review of Systems: All systems reviewed and are negative except as documented in history of present illness above.   Past Medical History:  Diagnosis Date  . Hypertension     History reviewed. No pertinent surgical history.  Social History:  reports that he has never smoked. He has never used smokeless tobacco. He reports that he does not drink alcohol or use drugs.  No Known Allergies  Family History  Problem Relation Age of Onset  . COPD Mother      Prior to Admission medications   Medication Sig Start Date End Date Taking? Authorizing Provider  ipratropium (ATROVENT) 0.06 % nasal spray Place 2 sprays into both nostrils 2 (two) times daily. 05/17/19   [provider]  ketorolac (TORADOL) 10 MG tablet Take 1 tablet (10 mg total) by mouth every 8 (eight) hours as needed. Patient not taking: Reported on 05/21/2019 07/31/14   Gregor Hams, MD  montelukast (SINGULAIR) 10 MG tablet Take 10 mg by mouth daily. 05/17/19   [provider]  tamsulosin (FLOMAX) 0.4 MG CAPS capsule Take 1 capsule (0.4 mg total) by mouth daily. Patient not taking: Reported on 05/21/2019 07/31/14   Gregor Hams, MD    Physical Exam: Vitals:   05/21/19 1634 05/21/19 1747 05/21/19 1825 05/21/19 1850  BP:  (!) 217/149 (!) 220/146 (!) 220/138  Pulse:  92  78  Resp:  18  17  Temp:      TempSrc:      SpO2:  100%  100%  Weight: 81.6 kg     Height: 5\' 8"  (1.727 m)      Constitutional: Resting in bed, NAD, calm, comfortable Eyes: PERRL, lids and conjunctivae normal ENMT: Mucous membranes are moist. Posterior pharynx clear of any exudate or lesions.Normal dentition.  Neck: normal, supple, no masses. Respiratory: clear to  auscultation bilaterally, no wheezing, no crackles. Normal respiratory effort. No accessory muscle use.  Cardiovascular: Regular rate and rhythm, no murmurs / rubs / gallops. No extremity edema. 2+ pedal pulses. Abdomen: no tenderness, no masses palpated. No hepatosplenomegaly. Bowel sounds positive.  Musculoskeletal: no clubbing / cyanosis. No joint deformity upper and lower extremities. Good ROM, no contractures. Normal muscle tone.  Skin: no rashes, lesions, ulcers. No induration Neurologic: CN 2-12 grossly intact. Sensation intact, Strength 5/5 in all 4.  Psychiatric: Normal judgment and insight. Alert and oriented x 3. Normal mood.    Labs on Admission: I have personally reviewed following labs and imaging studies  CBC: Recent Labs  Lab 05/21/19 1650  WBC 10.3  HGB 10.8*  HCT 30.5*  MCV 86.6  PLT 68*   Basic Metabolic Panel: Recent Labs  Lab 05/21/19 1650  NA 134*  K 2.6*  CL 96*  CO2 25  GLUCOSE 109*  BUN 52*  CREATININE 5.23*  CALCIUM 8.9   GFR: Estimated Creatinine Clearance: 19.6 mL/min (A) (by C-G formula based on SCr of 5.23 mg/dL (H)). Liver Function Tests: No results for input(s): AST, ALT, ALKPHOS, BILITOT, PROT, ALBUMIN in the last 168 hours. No results for input(s): LIPASE, AMYLASE in the last 168 hours. No results for input(s): AMMONIA in the last 168 hours. Coagulation Profile: No results for input(s): INR, PROTIME in the last 168 hours. Cardiac Enzymes: No results for input(s): CKTOTAL, CKMB, CKMBINDEX, TROPONINI in the last 168 hours. BNP (last 3 results) No results for input(s): PROBNP in the last 8760 hours. HbA1C: No results for input(s): HGBA1C in the last 72 hours. CBG: No results for input(s): GLUCAP in the last 168 hours. Lipid Profile: No results for input(s): CHOL, HDL, LDLCALC, TRIG, CHOLHDL, LDLDIRECT in the last 72 hours. Thyroid Function Tests: No results for input(s): TSH, T4TOTAL, FREET4, T3FREE, THYROIDAB in the last 72  hours. Anemia Panel: No results for input(s): VITAMINB12, FOLATE, FERRITIN, TIBC, IRON, RETICCTPCT in the last 72 hours. Urine analysis:    Component Value Date/Time   COLORURINE YELLOW (A) 07/30/2014 2334   APPEARANCEUR CLEAR (A) 07/30/2014 2334   LABSPEC 1.018 07/30/2014 2334   PHURINE 7.0 07/30/2014 2334   GLUCOSEU NEGATIVE 07/30/2014 2334   HGBUR 2+ (A) 07/30/2014 2334   BILIRUBINUR NEGATIVE 07/30/2014 2334   [place] 07/30/2014 2334   PROTEINUR NEGATIVE 07/30/2014 2334   NITRITE NEGATIVE 07/30/2014 2334   LEUKOCYTESUR NEGATIVE 07/30/2014 2334    Radiological Exams on Admission: DG Chest 2 View  Result Date: 05/21/2019 CLINICAL DATA:  Hypertension EXAM: CHEST - 2 VIEW COMPARISON:  None. FINDINGS: The heart size and mediastinal contours are within normal limits. Both lungs are clear. The visualized skeletal structures are unremarkable. IMPRESSION: Normal study. Electronically Signed   By: Rolm Baptise M.D.   On: 05/21/2019 17:28   CT Head Wo Contrast  Result Date: 05/21/2019 CLINICAL DATA:  Headache EXAM: CT HEAD WITHOUT CONTRAST TECHNIQUE: Contiguous axial images were obtained from the base of the skull through the vertex without intravenous contrast. COMPARISON:  None.  FINDINGS: Brain: No acute intracranial abnormality. Specifically, no hemorrhage, hydrocephalus, mass lesion, acute infarction, or significant intracranial injury. Vascular: No hyperdense vessel or unexpected calcification. Skull: No acute calvarial abnormality. Sinuses/Orbits: Visualized paranasal sinuses and mastoids clear. Orbital soft tissues unremarkable. Other: None IMPRESSION: Normal study. Electronically Signed   By: Rolm Baptise M.D.   On: 05/21/2019 17:54    EKG: Independently reviewed. Sinus rhythm, T wave inversions leads I, 2, aVL, V5-V6, high voltage, QTC 477.  No prior for comparison.  Assessment/Plan Principal Problem:   Acute renal failure (ARF) (HCC) Active Problems:   Hypertensive  emergency   Hypokalemia   Thrombocytopenia (HCC)   Elevated troponin  Jeffrey Maynard is a 34 y.o. male with medical history significant for hypertension who is admitted with acute renal failure and hypertensive emergency.  Acute renal failure: Only prior labs available showed creatinine 1.17 with GFR >60 from June 2016.  Creatinine now 5.23 with GFR 16 at time of admission.  Nephrology following and full work-up in process. -Follow urinalysis, Urine sodium and osmolality, serum osmolality -Renal ultrasound -Follow renal labs  Hypertensive emergency: -Start IV nicardipine with initial goal SBP 180 in the first hour then gradual decrease to SBP 160  Thrombocytopenia: Platelets 68,000.  No obvious signs of bleeding.  Continue renal work-up as above as well as AdamsTS13 activity.  Hold pharmacologic VTE prophylaxis.  Elevated troponin: Suspect demand ischemia in the setting of acute renal failure and hypertensive emergency.  Nonspecific T wave changes on EKG likely from severe hypertension.  Follow repeat troponin and continue to monitor.  Hypokalemia: Check magnesium, will give K 40 mEq once now and repeat labs in a.m.  DVT prophylaxis: SCDs Code Status: Full code, confirmed with patient Family Communication: Discussed with patient spouse at bedside Disposition Plan: From home, likely discharge to home pending further work-up of renal failure and management of hypertensive emergency. Consults called: Nephrology Admission status: Inpatient, patient likely requires greater than 2 midnight length stay for management of acute renal failure and hypertensive emergency as he is requiring IV antihypertensive therapy and may ultimately need renal biopsy this admission.   Zada Finders MD [facility]  If 7PM-7AM, please contact night-coverage [email or web address]  05/21/2019, 7:28 PM

## 2019-05-21 NOTE — ED Notes (Signed)
Called Jeffrey Maynard to see if we can narrow down labs to fewer tubes. States bc most are sendouts to [facility] we cannot combine any tubes.

## 2019-05-21 NOTE — ED Notes (Signed)
See triage note. Pt alert and resting calmly in bed. Reports was seen at work for annual physical and was told to be seen for HTN. States history of HTN and was told he needed to be put on BP meds but states never took them. Reports history of HTN on mom's side of the family. States HA at times. Denies CP/SOB. Declined offer of warm blanket. Bed locked low. Rail up.

## 2019-05-21 NOTE — ED Notes (Signed)
Astronomer. Agrees pt can be moved into room once one available.

## 2019-05-22 ENCOUNTER — Inpatient Hospital Stay (HOSPITAL_COMMUNITY)
Admit: 2019-05-22 | Discharge: 2019-05-22 | Disposition: A | Discharge status: Home / Self Care | Payer: 59 | Attending: Internal Medicine | Admitting: Internal Medicine

## 2019-05-22 DIAGNOSIS — R778 Other specified abnormalities of plasma proteins: Secondary | ICD-10-CM

## 2019-05-22 DIAGNOSIS — E876 Hypokalemia: Secondary | ICD-10-CM

## 2019-05-22 DIAGNOSIS — R9431 Abnormal electrocardiogram [ECG] [EKG]: Secondary | ICD-10-CM

## 2019-05-22 LAB — CBC
HCT: 27.4 % — ABNORMAL LOW (ref 39.0–52.0)
Hemoglobin: 10.2 g/dL — ABNORMAL LOW (ref 13.0–17.0)
MCH: 31.3 pg (ref 26.0–34.0)
MCHC: 37.2 g/dL — ABNORMAL HIGH (ref 30.0–36.0)
MCV: 84 fL (ref 80.0–100.0)
Platelets: 80 10*3/uL — ABNORMAL LOW (ref 150–400)
RBC: 3.26 MIL/uL — ABNORMAL LOW (ref 4.22–5.81)
RDW: 13.4 % (ref 11.5–15.5)
WBC: 10.1 10*3/uL (ref 4.0–10.5)
nRBC: 0 % (ref 0.0–0.2)

## 2019-05-22 LAB — RENAL FUNCTION PANEL
Albumin: 3.8 g/dL (ref 3.5–5.0)
Anion gap: 12 (ref 5–15)
BUN: 52 mg/dL — ABNORMAL HIGH (ref 6–20)
CO2: 24 mmol/L (ref 22–32)
Calcium: 8.5 mg/dL — ABNORMAL LOW (ref 8.9–10.3)
Chloride: 99 mmol/L (ref 98–111)
Creatinine, Ser: 5.51 mg/dL — ABNORMAL HIGH (ref 0.61–1.24)
GFR calc Af Amer: 15 mL/min — ABNORMAL LOW (ref >60)
GFR calc non Af Amer: 13 mL/min — ABNORMAL LOW (ref >60)
Glucose, Bld: 124 mg/dL — ABNORMAL HIGH (ref 70–99)
Phosphorus: 4.7 mg/dL — ABNORMAL HIGH (ref 2.5–4.6)
Potassium: 2.6 mmol/L — CL (ref 3.5–5.1)
Sodium: 135 mmol/L (ref 135–145)

## 2019-05-22 LAB — MRSA PCR SCREENING: MRSA by PCR: NEGATIVE

## 2019-05-22 LAB — POTASSIUM: Potassium: 3.1 mmol/L — ABNORMAL LOW (ref 3.5–5.1)

## 2019-05-22 LAB — MAGNESIUM: Magnesium: 2 mg/dL (ref 1.7–2.4)

## 2019-05-22 LAB — TROPONIN I (HIGH SENSITIVITY)
Troponin I (High Sensitivity): 549 ng/L (ref <18)
Troponin I (High Sensitivity): 680 ng/L (ref <18)

## 2019-05-22 LAB — GLUCOSE, CAPILLARY: Glucose-Capillary: 125 mg/dL — ABNORMAL HIGH (ref 70–99)

## 2019-05-22 LAB — HEPATITIS B CORE ANTIBODY, IGM: Hep B C IgM: NONREACTIVE

## 2019-05-22 LAB — RPR: RPR Ser Ql: NONREACTIVE

## 2019-05-22 LAB — HEPATITIS B SURFACE ANTIBODY,QUALITATIVE: Hep B S Ab: REACTIVE — AB

## 2019-05-22 LAB — LACTATE DEHYDROGENASE: LDH: 684 U/L — ABNORMAL HIGH (ref 98–192)

## 2019-05-22 MED ORDER — CARVEDILOL 25 MG PO TABS
25.0000 mg | ORAL_TABLET | Freq: Two times a day (BID) | ORAL | Status: DC
  Administered 2019-05-22 – 2019-05-25 (×6): 25 mg via ORAL
  Filled 2019-05-22 (×6): qty 1

## 2019-05-22 MED ORDER — HYDRALAZINE HCL 20 MG/ML IJ SOLN
10.0000 mg | Freq: Four times a day (QID) | INTRAMUSCULAR | Status: DC | PRN
  Administered 2019-05-22: 21:00:00 10 mg via INTRAVENOUS
  Filled 2019-05-22: qty 1

## 2019-05-22 MED ORDER — MAGNESIUM SULFATE 2 GM/50ML IV SOLN
2.0000 g | Freq: Once | INTRAVENOUS | Status: AC
  Administered 2019-05-22: 2 g via INTRAVENOUS
  Filled 2019-05-22: qty 50

## 2019-05-22 MED ORDER — POTASSIUM CHLORIDE CRYS ER 20 MEQ PO TBCR
40.0000 meq | EXTENDED_RELEASE_TABLET | Freq: Once | ORAL | Status: AC
  Administered 2019-05-22: 18:00:00 40 meq via ORAL
  Filled 2019-05-22: qty 2

## 2019-05-22 MED ORDER — SODIUM CHLORIDE 0.9 % IV SOLN
INTRAVENOUS | Status: DC | PRN
  Administered 2019-05-22: 23:00:00 250 mL via INTRAVENOUS

## 2019-05-22 MED ORDER — CHLORHEXIDINE GLUCONATE CLOTH 2 % EX PADS
6.0000 | MEDICATED_PAD | Freq: Every day | CUTANEOUS | Status: DC
  Administered 2019-05-22 – 2019-05-23 (×2): 6 via TOPICAL

## 2019-05-22 MED ORDER — POTASSIUM CHLORIDE 10 MEQ/100ML IV SOLN
10.0000 meq | INTRAVENOUS | Status: DC
  Administered 2019-05-22 (×3): 10 meq via INTRAVENOUS
  Filled 2019-05-22 (×6): qty 100

## 2019-05-22 MED ORDER — POTASSIUM CHLORIDE 10 MEQ/100ML IV SOLN
10.0000 meq | INTRAVENOUS | Status: AC
  Administered 2019-05-22 – 2019-05-23 (×3): 10 meq via INTRAVENOUS
  Filled 2019-05-22 (×4): qty 100

## 2019-05-22 MED ORDER — AMLODIPINE BESYLATE 10 MG PO TABS
10.0000 mg | ORAL_TABLET | Freq: Every day | ORAL | Status: DC
  Administered 2019-05-22 – 2019-05-25 (×4): 10 mg via ORAL
  Filled 2019-05-22: qty 1
  Filled 2019-05-22: qty 2
  Filled 2019-05-22 (×2): qty 1

## 2019-05-22 NOTE — Consult Note (Signed)
PHARMACY CONSULT NOTE - FOLLOW UP  Pharmacy Consult for Electrolyte Monitoring and Replacement   Recent Labs: Potassium (mmol/L)  Date Value  05/22/2019 2.6 (LL)   Magnesium (mg/dL)  Date Value  05/22/2019 2.0   Calcium (mg/dL)  Date Value  05/22/2019 8.5 (L)   Albumin (g/dL)  Date Value  05/22/2019 3.8   Phosphorus (mg/dL)  Date Value  05/22/2019 4.7 (H)   Sodium (mmol/L)  Date Value  05/22/2019 135     Assessment: 34 y.o.malewith medical history significant forhypertensionandkidney stonewho presents to the ED from urgent care for evaluation of severe hypertension.He says over the last 2 weeks he has been having intermittent left temporal headache described as a quick sharp discomfort. He has had some associated blurry vision and a couple days ago had nausea with emesis. Pt with AKI with proteinuria. No indication for dialysis today.   Goal of Therapy:  WNL  Plan:  Patient received KCl 10 mEq x 3. Will give additional KCl 40 mEq x 1 PO.   Will follow up with K+ at 1800.   Jeffrey Maynard ,PharmD, BCPS Clinical Pharmacist 05/22/2019 2:59 PM

## 2019-05-22 NOTE — ED Notes (Signed)
Pt repositioned in bed, given extra pillow. Significant other at bedside. No needs at this time

## 2019-05-22 NOTE — Progress Notes (Signed)
[facility]  ROUNDING NOTE   Subjective:   Moved to ICU this morning.  Patient with no headaches.   Objective:  Vital signs in last 24 hours:  Temp:  [98.5 F (36.9 C)-98.8 F (37.1 C)] 98.8 F (37.1 C) (04/06 0940) Pulse Rate:  [78-114] 91 (04/06 1300) Resp:  [10-24] 12 (04/06 1300) BP: (126-246)/(67-183) 143/92 (04/06 1300) SpO2:  [94 %-100 %] 100 % (04/06 1300) Weight:  [81.6 kg] 81.6 kg (04/06 0940)  Weight change:  Filed Weights   05/21/19 1634 05/22/19 0940  Weight: 81.6 kg 81.6 kg    Intake/Output: I/O last 3 completed shifts: In: 20 [I.V.:20] Out: -    Intake/Output this shift:  Total I/O In: 1445.3 [P.O.:240; I.V.:879; IV Piggyback:326.3] Out: -   Physical Exam: General: NAD,   Head: Normocephalic, atraumatic. Moist oral mucosal membranes  Eyes: Anicteric, PERRL  Neck: Supple, trachea midline  Lungs:  Clear to auscultation  Heart: Regular rate and rhythm  Abdomen:  Soft, nontender,   Extremities:  no peripheral edema.  Neurologic: Nonfocal, moving all four extremities  Skin: No lesions       Basic Metabolic Panel: Recent Labs  Lab 05/21/19 1650 05/21/19 1850 05/21/19 1901 05/22/19 0450  NA 134*  --   --  135  K 2.6*  --   --  2.6*  CL 96*  --   --  99  CO2 25  --   --  24  GLUCOSE 109*  --   --  124*  BUN 52*  --   --  52*  CREATININE 5.23*  --   --  5.51*  CALCIUM 8.9  --   --  8.5*  MG  --  2.1  --  2.0  PHOS  --   --  4.4 4.7*    Liver Function Tests: Recent Labs  Lab 05/22/19 0450  ALBUMIN 3.8   No results for input(s): LIPASE, AMYLASE in the last 168 hours. No results for input(s): AMMONIA in the last 168 hours.  CBC: Recent Labs  Lab 05/21/19 1650 05/22/19 0450  WBC 10.3 10.1  HGB 10.8* 10.2*  HCT 30.5* 27.4*  MCV 86.6 84.0  PLT 68* 80*    Cardiac Enzymes: Recent Labs  Lab 05/21/19 1830  CKTOTAL 540*    BNP: Invalid input(s): POCBNP  CBG: Recent Labs  Lab 05/22/19 0932  GLUCAP 125*     Microbiology: Results for orders placed or performed during the hospital encounter of 05/21/19  SARS CORONAVIRUS 2 (TAT 6-24 HRS) Nasopharyngeal Nasopharyngeal Swab     Status: None   Collection Time: 05/21/19  6:00 PM   Specimen: Nasopharyngeal Swab  Result Value Ref Range Status   SARS Coronavirus 2 NEGATIVE NEGATIVE Final    Comment: (NOTE) SARS-CoV-2 target nucleic acids are NOT DETECTED. The SARS-CoV-2 RNA is generally detectable in upper and lower respiratory specimens during the acute phase of infection. Negative results do not preclude SARS-CoV-2 infection, do not rule out co-infections with other pathogens, and should not be used as the sole basis for treatment or other patient management decisions. Negative results must be combined with clinical observations, patient history, and epidemiological information. The expected result is Negative. Fact Sheet for Patients: SugarRoll.be Fact Sheet for Healthcare Providers: https://www.woods-mathews.com/ This test is not yet approved or cleared by the [place] FDA and  has been authorized for detection and/or diagnosis of SARS-CoV-2 by FDA under an Emergency Use Authorization (EUA). This EUA will remain  in effect (  meaning this test can be used) for the duration of the COVID-19 declaration under Section 56 4(b)(1) of the Act, 21 U.S.C. section 360bbb-3(b)(1), unless the authorization is terminated or revoked sooner. Performed at [facility], [place] [address], [place], [place] [address]   MRSA PCR Screening     Status: None   Collection Time: 05/22/19  7:00 AM   Specimen: Nasal Mucosa; Nasopharyngeal  Result Value Ref Range Status   MRSA by PCR NEGATIVE NEGATIVE Final    Comment:        The GeneXpert MRSA Assay (FDA approved for NASAL specimens only), is one component of a comprehensive MRSA colonization surveillance program. It is not intended to diagnose  MRSA infection nor to guide or monitor treatment for MRSA infections. Performed at [facility], [address] [place], [place] [address]     Coagulation Studies: Recent Labs    05/21/19 1901  LABPROT 13.4  INR 1.0    Urinalysis: Recent Labs    05/21/19 1830  COLORURINE YELLOW*  LABSPEC 1.015  PHURINE 6.0  GLUCOSEU 50*  HGBUR LARGE*  BILIRUBINUR NEGATIVE  KETONESUR NEGATIVE  PROTEINUR >=300*  NITRITE NEGATIVE  LEUKOCYTESUR NEGATIVE      Imaging: DG Chest 2 View  Result Date: 05/21/2019 CLINICAL DATA:  Hypertension EXAM: CHEST - 2 VIEW COMPARISON:  None. FINDINGS: The heart size and mediastinal contours are within normal limits. Both lungs are clear. The visualized skeletal structures are unremarkable. IMPRESSION: Normal study. Electronically Signed   By: Rolm Baptise M.D.   On: 05/21/2019 17:28   CT Head Wo Contrast  Result Date: 05/21/2019 CLINICAL DATA:  Headache EXAM: CT HEAD WITHOUT CONTRAST TECHNIQUE: Contiguous axial images were obtained from the base of the skull through the vertex without intravenous contrast. COMPARISON:  None. FINDINGS: Brain: No acute intracranial abnormality. Specifically, no hemorrhage, hydrocephalus, mass lesion, acute infarction, or significant intracranial injury. Vascular: No hyperdense vessel or unexpected calcification. Skull: No acute calvarial abnormality. Sinuses/Orbits: Visualized paranasal sinuses and mastoids clear. Orbital soft tissues unremarkable. Other: None IMPRESSION: Normal study. Electronically Signed   By: Rolm Baptise M.D.   On: 05/21/2019 17:54   US RENAL  Result Date: 05/22/2019 CLINICAL DATA:  Initial evaluation for acute renal failure, hematuria, nephrolithiasis. EXAM: RENAL / URINARY TRACT ULTRASOUND COMPLETE COMPARISON:  Prior CT from 07/31/2014. FINDINGS: Right Kidney: Renal measurements: 9.6 x 4.9 x 5.0 cm = volume: 124 mL. Diffusely increased echogenicity seen within the renal parenchyma. 6 mm  nonobstructive stone present at the upper pole. No hydronephrosis. No focal renal mass. Left Kidney: Renal measurements: 9.6 x 5.1 x 4.4 cm = volume: 113 mL. Diffusely increased echogenicity seen within the renal parenchyma. 5 mm nonobstructive stone present at the interpolar region. No hydronephrosis. No focal renal mass. Bladder: Appears normal for degree of bladder distention. Other: None. IMPRESSION: 1. Increased echogenicity within the renal parenchyma, compatible with medical renal disease. 2. Bilateral nonobstructive nephrolithiasis as above. No hydronephrosis. Electronically Signed   By: Jeannine Boga M.D.   On: 05/22/2019 00:55     Medications:   . niCARDipine 3 mg/hr (05/22/19 1043)   . amLODipine  10 mg Oral Daily  . carvedilol  25 mg Oral BID WC  . Chlorhexidine Gluconate Cloth  6 each Topical Daily  . sodium chloride flush  3 mL Intravenous Q12H   acetaminophen **OR** acetaminophen, hydrALAZINE, ondansetron **OR** ondansetron (ZOFRAN) IV  Assessment/ Plan:  Mr. Jeffrey Maynard is a 34 y.o. black male with no  significant past medical history, who was admitted to [facility] on 05/21/2019 for sent by dr hb pressure  1. Acute renal failure with proteinuria: baseline creatinine of 1.17, GFR was normal on 07/31/2014.  Nephrotic range proteinuria Renal ultrasound with diffuse echogenicity consistent with chronic kidney disease.  Unfortunately with echogenicity, unclear if renal biopsy will be on clinical benefit.  Discussed dialysis with patient. No acute indication for dialysis.  Serologic work up pending.   2. Hematuria: with nephrolithiasis on ultrasound. Hematuria was present in 2016.    3. Hypertensive emergency: placed on nicardipine gtt - Started on amlodipine and carvedilol today - Discussed starting hydralazine further control required.   4. Hypokalemia - potassium being replaced.   5. Anemia with chronic kidney disease. Hemogloibn 10.2, normocytic.  - Check iron  studies.   6. Hyponatremia: secondary to renal failure, improved.   7. Thrombocytopenia - [id number] pending    LOS: 1 Jeffrey Maynard 4/6/20211:37 PM

## 2019-05-22 NOTE — Progress Notes (Signed)
[facility] [place] NAME: Jeffrey Maynard    MR#:  [id number]  DATE OF BIRTH:  04-Nov-1985  SUBJECTIVE:   No headache today Wife at bedside Feels overall better Off IV nicardipine gtt REVIEW OF SYSTEMS:   Review of Systems  Constitutional: Negative for chills, fever and weight loss.  HENT: Negative for ear discharge, ear pain and nosebleeds.   Eyes: Negative for blurred vision, pain and discharge.  Respiratory: Negative for sputum production, shortness of breath, wheezing and stridor.   Cardiovascular: Negative for chest pain, palpitations, orthopnea and PND.  Gastrointestinal: Negative for abdominal pain, diarrhea, nausea and vomiting.  Genitourinary: Negative for frequency and urgency.  Musculoskeletal: Negative for back pain and joint pain.  Neurological: Negative for sensory change, speech change, focal weakness and weakness.  Psychiatric/Behavioral: Negative for depression and hallucinations. The patient is not nervous/anxious.    Tolerating Diet:yes Tolerating PT: not needed  DRUG ALLERGIES:  No Known Allergies  VITALS:  Blood pressure (!) 143/92, pulse 91, temperature 98.8 F (37.1 C), temperature source Oral, resp. rate 12, height 5\' 8"  (1.727 m), weight 81.6 kg, SpO2 100 %.  PHYSICAL EXAMINATION:   Physical Exam  GENERAL:  34 y.o.-year-old patient lying in the bed with no acute distress.  EYES: Pupils equal, round, reactive to light and accommodation. No scleral icterus.   HEENT: Head atraumatic, normocephalic. Oropharynx and nasopharynx clear.  NECK:  Supple, no jugular venous distention. No thyroid enlargement, no tenderness.  LUNGS: Normal breath sounds bilaterally, no wheezing, rales, rhonchi. No use of accessory muscles of respiration.  CARDIOVASCULAR: S1, S2 normal. No murmurs, rubs, or gallops.  ABDOMEN: Soft, nontender, nondistended. Bowel sounds present. No organomegaly or mass.  EXTREMITIES: No cyanosis,  clubbing or edema b/l.    NEUROLOGIC: Cranial nerves II through XII are intact. No focal Motor or sensory deficits b/l.   PSYCHIATRIC:  patient is alert and oriented x 3.  SKIN: No obvious rash, lesion, or ulcer.   LABORATORY PANEL:  CBC Recent Labs  Lab 05/22/19 0450  WBC 10.1  HGB 10.2*  HCT 27.4*  PLT 80*    Chemistries  Recent Labs  Lab 05/22/19 0450  NA 135  K 2.6*  CL 99  CO2 24  GLUCOSE 124*  BUN 52*  CREATININE 5.51*  CALCIUM 8.5*  MG 2.0   Cardiac Enzymes No results for input(s): TROPONINI in the last 168 hours. RADIOLOGY:  DG Chest 2 View  Result Date: 05/21/2019 CLINICAL DATA:  Hypertension EXAM: CHEST - 2 VIEW COMPARISON:  None. FINDINGS: The heart size and mediastinal contours are within normal limits. Both lungs are clear. The visualized skeletal structures are unremarkable. IMPRESSION: Normal study. Electronically Signed   By: Rolm Baptise M.D.   On: 05/21/2019 17:28   CT Head Wo Contrast  Result Date: 05/21/2019 CLINICAL DATA:  Headache EXAM: CT HEAD WITHOUT CONTRAST TECHNIQUE: Contiguous axial images were obtained from the base of the skull through the vertex without intravenous contrast. COMPARISON:  None. FINDINGS: Brain: No acute intracranial abnormality. Specifically, no hemorrhage, hydrocephalus, mass lesion, acute infarction, or significant intracranial injury. Vascular: No hyperdense vessel or unexpected calcification. Skull: No acute calvarial abnormality. Sinuses/Orbits: Visualized paranasal sinuses and mastoids clear. Orbital soft tissues unremarkable. Other: None IMPRESSION: Normal study. Electronically Signed   By: Rolm Baptise M.D.   On: 05/21/2019 17:54   US RENAL  Result Date: 05/22/2019 CLINICAL DATA:  Initial evaluation for acute renal failure, hematuria, nephrolithiasis. EXAM: RENAL /  URINARY TRACT ULTRASOUND COMPLETE COMPARISON:  Prior CT from 07/31/2014. FINDINGS: Right Kidney: Renal measurements: 9.6 x 4.9 x 5.0 cm = volume: 124 mL.  Diffusely increased echogenicity seen within the renal parenchyma. 6 mm nonobstructive stone present at the upper pole. No hydronephrosis. No focal renal mass. Left Kidney: Renal measurements: 9.6 x 5.1 x 4.4 cm = volume: 113 mL. Diffusely increased echogenicity seen within the renal parenchyma. 5 mm nonobstructive stone present at the interpolar region. No hydronephrosis. No focal renal mass. Bladder: Appears normal for degree of bladder distention. Other: None. IMPRESSION: 1. Increased echogenicity within the renal parenchyma, compatible with medical renal disease. 2. Bilateral nonobstructive nephrolithiasis as above. No hydronephrosis. Electronically Signed   By: Jeannine Boga M.D.   On: 05/22/2019 00:55   ASSESSMENT AND PLAN:   Jeffrey Maynard is a 34 y.o. male with medical history significant for hypertension and kidney stone who presents to the ED from urgent care for evaluation of severe hypertension.He says over the last 2 weeks he has been having intermittent left temporal headache described as a quick sharp discomfort.  He has had some associated blurry vision and a couple days ago had nausea with emesis.  Acute renal failure: -Only prior labs available showed creatinine 1.17 with GFR >60 from June 2016. - Creatinine now 5.23 with GFR 16 at time of admission.  - Nephrology Dr Juleen China  Recommends out pt f/u for possible starting HD at later date -differential seems to be FSGS -Renal ultrasound  Hypertensive emergency: -Start IV nicardipine with initial goal SBP 180 in the first hour then gradual decrease to SBP 160--now weaned to off -on po amlodipine and coreg -prn hydralazine  Thrombocytopenia: -Platelets 68,000--> 80K  No obvious signs of bleeding.  - Continue renal work-up as above as well as AdamsTS13 activity.  - Hold pharmacologic VTE prophylaxis. -LDH pending  Elevated troponin: Suspect demand ischemia in the setting of acute renal failure and hypertensive  emergency.  Nonspecific T wave changes on EKG likely from severe hypertension.  -check echo -no cp  Hypokalemia: Check magnesium, will give K 40 mEq once now and repeat labs in a.m. -pharmacy consult placed  DVT prophylaxis: SCDs Code Status: Full code, confirmed with patient Family Communication: Discussed with patient spouse at bedside Disposition Plan: From home, likely discharge to home pending further work-up of renal failure and management of hypertensive emergency. Consults called: Nephrology Admission status: Inpatient, patient likely requires greater than 2 midnight length stay for management of acute renal failure and hypertensive emergency as he is requiring IV antihypertensive therapy and may ultimately need renal biopsy this admission.    TOTAL TIME TAKING CARE OF THIS PATIENT: 35 minutes.  >50% time spent on counselling and coordination of care  Note: This dictation was prepared with Dragon dictation along with smaller phrase technology. Any transcriptional errors that result from this process are unintentional.  Fritzi Mandes M.D    [facility]   CC: Primary care physician; System, Pcp Not InPatient ID: Jeffrey Maynard, male   DOB: 1985/12/19, 34 y.o.   MRN: [id number]

## 2019-05-22 NOTE — Consult Note (Signed)
PHARMACY CONSULT NOTE - FOLLOW UP  Pharmacy Consult for Electrolyte Monitoring and Replacement   Recent Labs: Potassium (mmol/L)  Date Value  05/22/2019 3.1 (L)   Magnesium (mg/dL)  Date Value  05/22/2019 2.0   Calcium (mg/dL)  Date Value  05/22/2019 8.5 (L)   Albumin (g/dL)  Date Value  05/22/2019 3.8   Phosphorus (mg/dL)  Date Value  05/22/2019 4.7 (H)   Sodium (mmol/L)  Date Value  05/22/2019 135     Assessment: 34 y.o.malewith medical history significant forhypertensionandkidney stonewho presents to the ED from urgent care for evaluation of severe hypertension.He says over the last 2 weeks he has been having intermittent left temporal headache described as a quick sharp discomfort. He has had some associated blurry vision and a couple days ago had nausea with emesis. Pt with AKI with proteinuria. No indication for dialysis today.   Goal of Therapy:  WNL  Plan:  Patient received KCl 10 mEq x 3. Will give additional KCl 40 mEq x 1 PO.   Will follow up with K+ at 1800.   4/6:  K @ 2049  = 3.1 Will order KCl 10 mEq IV X 3 and recheck K on 4/7 with AM labs.   Orene Desanctis ,PharmD Clinical Pharmacist 05/22/2019 10:17 PM

## 2019-05-23 ENCOUNTER — Other Ambulatory Visit (INDEPENDENT_AMBULATORY_CARE_PROVIDER_SITE_OTHER): Payer: Self-pay | Admitting: Vascular Surgery

## 2019-05-23 LAB — KAPPA/LAMBDA LIGHT CHAINS
Kappa free light chain: 76.9 mg/L — ABNORMAL HIGH (ref 3.3–19.4)
Kappa, lambda light chain ratio: 2.53 — ABNORMAL HIGH (ref 0.26–1.65)
Lambda free light chains: 30.4 mg/L — ABNORMAL HIGH (ref 5.7–26.3)

## 2019-05-23 LAB — BASIC METABOLIC PANEL
Anion gap: 10 (ref 5–15)
BUN: 59 mg/dL — ABNORMAL HIGH (ref 6–20)
CO2: 25 mmol/L (ref 22–32)
Calcium: 8.9 mg/dL (ref 8.9–10.3)
Chloride: 101 mmol/L (ref 98–111)
Creatinine, Ser: 5.85 mg/dL — ABNORMAL HIGH (ref 0.61–1.24)
GFR calc Af Amer: 14 mL/min — ABNORMAL LOW (ref >60)
GFR calc non Af Amer: 12 mL/min — ABNORMAL LOW (ref >60)
Glucose, Bld: 110 mg/dL — ABNORMAL HIGH (ref 70–99)
Potassium: 3.5 mmol/L (ref 3.5–5.1)
Sodium: 136 mmol/L (ref 135–145)

## 2019-05-23 LAB — C3 COMPLEMENT: C3 Complement: 146 mg/dL (ref 82–167)

## 2019-05-23 LAB — PROTEIN ELECTROPHORESIS, SERUM
A/G Ratio: 1.1 (ref 0.7–1.7)
Albumin ELP: 3.6 g/dL (ref 2.9–4.4)
Alpha-1-Globulin: 0.3 g/dL (ref 0.0–0.4)
Alpha-2-Globulin: 0.6 g/dL (ref 0.4–1.0)
Beta Globulin: 1.2 g/dL (ref 0.7–1.3)
Gamma Globulin: 1.3 g/dL (ref 0.4–1.8)
Globulin, Total: 3.4 g/dL (ref 2.2–3.9)
Total Protein ELP: 7 g/dL (ref 6.0–8.5)

## 2019-05-23 LAB — GLOMERULAR BASEMENT MEMBRANE ANTIBODIES: GBM Ab: 5 units (ref 0–20)

## 2019-05-23 LAB — ANA W/REFLEX: Anti Nuclear Antibody (ANA): NEGATIVE

## 2019-05-23 LAB — ECHOCARDIOGRAM COMPLETE
Height: 68 in
Weight: 2878.33 oz

## 2019-05-23 LAB — C4 COMPLEMENT: Complement C4, Body Fluid: 27 mg/dL (ref 12–38)

## 2019-05-23 LAB — PARATHYROID HORMONE, INTACT (NO CA): PTH: 159 pg/mL — ABNORMAL HIGH (ref 15–65)

## 2019-05-23 LAB — PATHOLOGIST SMEAR REVIEW

## 2019-05-23 MED ORDER — HYDRALAZINE HCL 25 MG PO TABS
25.0000 mg | ORAL_TABLET | Freq: Three times a day (TID) | ORAL | Status: DC
  Administered 2019-05-23: 25 mg via ORAL
  Filled 2019-05-23 (×3): qty 1

## 2019-05-23 MED ORDER — ISOSORBIDE MONONITRATE ER 30 MG PO TB24
60.0000 mg | ORAL_TABLET | Freq: Every day | ORAL | Status: DC
  Administered 2019-05-23 – 2019-05-25 (×3): 60 mg via ORAL
  Filled 2019-05-23 (×4): qty 2

## 2019-05-23 MED ORDER — HYDRALAZINE HCL 50 MG PO TABS
50.0000 mg | ORAL_TABLET | Freq: Three times a day (TID) | ORAL | Status: DC
  Administered 2019-05-23 – 2019-05-25 (×5): 50 mg via ORAL
  Filled 2019-05-23 (×5): qty 1

## 2019-05-23 NOTE — Progress Notes (Signed)
Alerted Dr. Posey Pronto that patient's diastolic is still elevated. MD stated to reorder tele monitoring and that she would start Imdur. Will complete and monitor patient closely.

## 2019-05-23 NOTE — Progress Notes (Addendum)
[facility] [place] NAME: Jeffrey Maynard    MR#:  [id number]  DATE OF BIRTH:  1985/11/01  SUBJECTIVE:   No headache today Feels overall better bp high this am REVIEW OF SYSTEMS:   Review of Systems  Constitutional: Negative for chills, fever and weight loss.  HENT: Negative for ear discharge, ear pain and nosebleeds.   Eyes: Negative for blurred vision, pain and discharge.  Respiratory: Negative for sputum production, shortness of breath, wheezing and stridor.   Cardiovascular: Negative for chest pain, palpitations, orthopnea and PND.  Gastrointestinal: Negative for abdominal pain, diarrhea, nausea and vomiting.  Genitourinary: Negative for frequency and urgency.  Musculoskeletal: Negative for back pain and joint pain.  Neurological: Negative for sensory change, speech change, focal weakness and weakness.  Psychiatric/Behavioral: Negative for depression and hallucinations. The patient is not nervous/anxious.    Tolerating Diet:yes Tolerating PT: not needed  DRUG ALLERGIES:  No Known Allergies  VITALS:  Blood pressure (!) 156/99, pulse 72, temperature 98.6 F (37 C), temperature source Oral, resp. rate 18, height 5\' 8"  (1.727 m), weight 82.4 kg, SpO2 100 %.  PHYSICAL EXAMINATION:   Physical Exam  GENERAL:  34 y.o.-year-old patient lying in the bed with no acute distress.  EYES: Pupils equal, round, reactive to light and accommodation. No scleral icterus.   HEENT: Head atraumatic, normocephalic. Oropharynx and nasopharynx clear.  NECK:  Supple, no jugular venous distention. No thyroid enlargement, no tenderness.  LUNGS: Normal breath sounds bilaterally, no wheezing, rales, rhonchi. No use of accessory muscles of respiration.  CARDIOVASCULAR: S1, S2 normal. No murmurs, rubs, or gallops.  ABDOMEN: Soft, nontender, nondistended. Bowel sounds present. No organomegaly or mass.  EXTREMITIES: No cyanosis, clubbing or edema b/l.     NEUROLOGIC: Cranial nerves II through XII are intact. No focal Motor or sensory deficits b/l.   PSYCHIATRIC:  patient is alert and oriented x 3.  SKIN: No obvious rash, lesion, or ulcer.   LABORATORY PANEL:  CBC Recent Labs  Lab 05/22/19 0450  WBC 10.1  HGB 10.2*  HCT 27.4*  PLT 80*    Chemistries  Recent Labs  Lab 05/22/19 0450 05/22/19 2049 05/23/19 0432  NA 135  --  136  K 2.6*   < > 3.5  CL 99  --  101  CO2 24  --  25  GLUCOSE 124*  --  110*  BUN 52*  --  59*  CREATININE 5.51*  --  5.85*  CALCIUM 8.5*  --  8.9  MG 2.0  --   --    < > = values in this interval not displayed.   Cardiac Enzymes No results for input(s): TROPONINI in the last 168 hours. RADIOLOGY:  DG Chest 2 View  Result Date: 05/21/2019 CLINICAL DATA:  Hypertension EXAM: CHEST - 2 VIEW COMPARISON:  None. FINDINGS: The heart size and mediastinal contours are within normal limits. Both lungs are clear. The visualized skeletal structures are unremarkable. IMPRESSION: Normal study. Electronically Signed   By: Rolm Baptise M.D.   On: 05/21/2019 17:28   CT Head Wo Contrast  Result Date: 05/21/2019 CLINICAL DATA:  Headache EXAM: CT HEAD WITHOUT CONTRAST TECHNIQUE: Contiguous axial images were obtained from the base of the skull through the vertex without intravenous contrast. COMPARISON:  None. FINDINGS: Brain: No acute intracranial abnormality. Specifically, no hemorrhage, hydrocephalus, mass lesion, acute infarction, or significant intracranial injury. Vascular: No hyperdense vessel or unexpected calcification. Skull: No acute calvarial abnormality.  Sinuses/Orbits: Visualized paranasal sinuses and mastoids clear. Orbital soft tissues unremarkable. Other: None IMPRESSION: Normal study. Electronically Signed   By: Rolm Baptise M.D.   On: 05/21/2019 17:54   US RENAL  Result Date: 05/22/2019 CLINICAL DATA:  Initial evaluation for acute renal failure, hematuria, nephrolithiasis. EXAM: RENAL / URINARY TRACT  ULTRASOUND COMPLETE COMPARISON:  Prior CT from 07/31/2014. FINDINGS: Right Kidney: Renal measurements: 9.6 x 4.9 x 5.0 cm = volume: 124 mL. Diffusely increased echogenicity seen within the renal parenchyma. 6 mm nonobstructive stone present at the upper pole. No hydronephrosis. No focal renal mass. Left Kidney: Renal measurements: 9.6 x 5.1 x 4.4 cm = volume: 113 mL. Diffusely increased echogenicity seen within the renal parenchyma. 5 mm nonobstructive stone present at the interpolar region. No hydronephrosis. No focal renal mass. Bladder: Appears normal for degree of bladder distention. Other: None. IMPRESSION: 1. Increased echogenicity within the renal parenchyma, compatible with medical renal disease. 2. Bilateral nonobstructive nephrolithiasis as above. No hydronephrosis. Electronically Signed   By: Jeannine Boga M.D.   On: 05/22/2019 00:55   ECHOCARDIOGRAM COMPLETE  Result Date: 05/23/2019    ECHOCARDIOGRAM REPORT   Patient Name:   Jeffrey Maynard Date of Exam: 05/22/2019 Medical Rec #:  [id number]          Height:       68.0 in Accession #:    [id number]         Weight:       179.9 lb Date of Birth:  [birth year]         BSA:          1.954 m Patient Age:    34 years           BP:           185/127 mmHg Patient Gender: M                  HR:           98 bpm. Exam Location:  [facility] Procedure: 2D Echo, Cardiac Doppler and Color Doppler Indications:     R94.31 Abnormal EKG  History:         Patient has no prior history of Echocardiogram examinations.                  Risk Factors:Hypertension.  Sonographer:     Wilford Sports Rodgers-Jones Referring Phys:  Crisman Diagnosing Phys: Nelva Bush MD IMPRESSIONS  1. Left ventricular ejection fraction, by estimation, is 55 to 60%. The left ventricle has normal function. The left ventricle has no regional wall motion abnormalities. There is moderate left ventricular hypertrophy. Left ventricular diastolic parameters are consistent with Grade II diastolic  dysfunction (pseudonormalization).  2. Right ventricular systolic function is normal. The right ventricular size is normal.  3. Left atrial size was mildly dilated.  4. The mitral valve is normal in structure. Mild mitral valve regurgitation. No evidence of mitral stenosis.  5. The aortic valve is tricuspid. Aortic valve regurgitation is not visualized. No aortic stenosis is present.  6. The inferior vena cava is normal in size with greater than 50% respiratory variability, suggesting right atrial pressure of 3 mmHg. FINDINGS  Left Ventricle: Left ventricular ejection fraction, by estimation, is 55 to 60%. The left ventricle has normal function. The left ventricle has no regional wall motion abnormalities. The left ventricular internal cavity size was normal in size. There is  moderate left ventricular hypertrophy. Left ventricular diastolic parameters are consistent with  Grade II diastolic dysfunction (pseudonormalization). Right Ventricle: The right ventricular size is normal. No increase in right ventricular wall thickness. Right ventricular systolic function is normal. Left Atrium: Left atrial size was mildly dilated. Right Atrium: Right atrial size was normal in size. Pericardium: There is no evidence of pericardial effusion. Mitral Valve: The mitral valve is normal in structure. Mild mitral valve regurgitation. No evidence of mitral valve stenosis. Tricuspid Valve: The tricuspid valve is normal in structure. Tricuspid valve regurgitation is trivial. Aortic Valve: The aortic valve is tricuspid. Aortic valve regurgitation is not visualized. No aortic stenosis is present. Pulmonic Valve: The pulmonic valve was normal in structure. Pulmonic valve regurgitation is not visualized. No evidence of pulmonic stenosis. Aorta: The aortic root is normal in size and structure. Pulmonary Artery: The pulmonary artery is of normal size. Venous: The inferior vena cava is normal in size with greater than 50% respiratory  variability, suggesting right atrial pressure of 3 mmHg. IAS/Shunts: The interatrial septum was not well visualized.  LEFT VENTRICLE PLAX 2D LVIDd:         5.09 cm  Diastology LVIDs:         3.55 cm  LV e' lateral:   5.44 cm/s LV PW:         1.48 cm  LV E/e' lateral: 15.4 LV IVS:        1.29 cm  LV e' medial:    7.29 cm/s LVOT diam:     2.20 cm  LV E/e' medial:  11.5 LV SV:         61 LV SV Index:   31 LVOT Area:     3.80 cm  RIGHT VENTRICLE RV Basal diam:  3.62 cm RV S prime:     17.00 cm/s TAPSE (M-mode): 2.0 cm LEFT ATRIUM             Index       RIGHT ATRIUM           Index LA diam:        4.60 cm 2.35 cm/m  RA Area:     13.60 cm LA Vol (A2C):   55.1 ml 28.20 ml/m RA Volume:   39.10 ml  20.01 ml/m LA Vol (A4C):   59.6 ml 30.51 ml/m LA Biplane Vol: 58.3 ml 29.84 ml/m  AORTIC VALVE LVOT Vmax:   101.40 cm/s LVOT Vmean:  71.950 cm/s LVOT VTI:    0.162 m  AORTA Ao Root diam: 3.50 cm MITRAL VALVE MV Area (PHT): 4.31 cm    SHUNTS MV Decel Time: 176 msec    Systemic VTI:  0.16 m MV E velocity: 83.60 cm/s  Systemic Diam: 2.20 cm MV A velocity: 53.70 cm/s MV E/A ratio:  1.56 Harrell Gave End MD Electronically signed by Nelva Bush MD Signature Date/Time: 05/23/2019/7:02:59 AM    Final    ASSESSMENT AND PLAN:   ARCANGEL MINION is a 34 y.o. male with medical history significant for hypertension and kidney stone who presents to the ED from urgent care for evaluation of severe hypertension.He says over the last 2 weeks he has been having intermittent left temporal headache described as a quick sharp discomfort.  He has had some associated blurry vision and a couple days ago had nausea with emesis.  Acute renal failure: -Only prior labs available showed creatinine 1.17 with GFR >60 from June 2016. - Creatinine now 5.23 with GFR 16 at time of admission.  - Nephrology Dr Juleen China  Recommends out pt f/u for  possible starting HD at later date -differential seems to be FSGS -Renal ultrasound showed CKD changes  with bilateral nonobstructive kidney stones -Renal biopsy once BP stable  Hypertensive emergency: -Start IV nicardipine with initial goal SBP 180 in the first hour then gradual decrease to SBP 160--now weaned to off -on po amlodipine, coreg -4/7 added hydralazine -prn IV hydralazine  Thrombocytopenia: -Platelets 68,000--> 80K  No obvious signs of bleeding.  - Continue renal work-up as above as well as AdamsTS13 activity.  - Hold pharmacologic VTE prophylaxis. -LDH >600 -pathology to review PS  Elevated troponin: Suspect demand ischemia in the setting of acute renal failure and hypertensive emergency.  Nonspecific T wave changes on EKG likely from severe hypertension.  -check echo -no cp  Hypokalemia: -K 3.5   DVT prophylaxis: SCDs Code Status: Full code, confirmed with patient Family Communication: Discussed with patient spouse at bedside Disposition Plan: From home, likely discharge to home pending further work-up of renal failure and management of hypertensive emergency. -renal biopsy planned once BP stable likely tomorrow Consults called: Nephrology  Addendum: pt is getting PD catheter placement tomorrow. Peripheral smear few fragmented red cells --no evidence of TTP per Pathology  TOTAL TIME TAKING CARE OF THIS PATIENT: 35 minutes.  >50% time spent on counselling and coordination of care  Note: This dictation was prepared with Dragon dictation along with smaller phrase technology. Any transcriptional errors that result from this process are unintentional.  Fritzi Mandes M.D    [facility]   CC: Primary care physician; System, Pcp Not InPatient ID: LOTTIE SISKA, male   DOB: 11-16-1985, 34 y.o.   MRN: [id number]

## 2019-05-23 NOTE — Consult Note (Signed)
PHARMACY CONSULT NOTE - FOLLOW UP  Pharmacy Consult for Electrolyte Monitoring and Replacement   Recent Labs: Potassium (mmol/L)  Date Value  05/23/2019 3.5   Magnesium (mg/dL)  Date Value  05/22/2019 2.0   Calcium (mg/dL)  Date Value  05/23/2019 8.9   Albumin (g/dL)  Date Value  05/22/2019 3.8   Phosphorus (mg/dL)  Date Value  05/22/2019 4.7 (H)   Sodium (mmol/L)  Date Value  05/23/2019 136     Assessment: 34 y.o.malewith medical history significant forhypertensionandkidney stonewho presents to the ED from urgent care for evaluation of severe hypertension.He says over the last 2 weeks he has been having intermittent left temporal headache described as a quick sharp discomfort. He has had some associated blurry vision and a couple days ago had nausea with emesis. Pt with AKI with proteinuria. No indication for dialysis today.   Goal of Therapy:  WNL  Plan:  No replacement warranted at this time.   Pharmacy will continue to follow and replace electrolytes as needed.   Pernell Dupre, PharmD, BCPS Clinical Pharmacist 05/23/2019 7:23 AM

## 2019-05-23 NOTE — Progress Notes (Signed)
Alerted Dr. Posey Pronto that patient's BP is 165/124. Patient only has amlodipine and coreg due. MD responded to give both medications early and give a dose of hydralazine as well, MD placed order. Completed. MD also stated patient does not need to be on tele. Will dc order.

## 2019-05-23 NOTE — Progress Notes (Signed)
[facility]  ROUNDING NOTE   Subjective:   Wife at bedside.  Creatinine 5.89 (5.51)  Patient states he feels that he wants to go ahead and start dialysis.   Objective:  Vital signs in last 24 hours:  Temp:  [98 F (36.7 C)-98.7 F (37.1 C)] 98.7 F (37.1 C) (04/07 1704) Pulse Rate:  [72-97] 85 (04/07 1704) Resp:  [17-19] 19 (04/07 1704) BP: (139-185)/(99-127) 154/101 (04/07 1704) SpO2:  [100 %] 100 % (04/07 1704) Weight:  [82.4 kg] 82.4 kg (04/07 0521)  Weight change: -0.047 kg Filed Weights   05/21/19 1634 05/22/19 0940 05/23/19 0521  Weight: 81.6 kg 81.6 kg 82.4 kg    Intake/Output: I/O last 3 completed shifts: In: 2008.8 [P.O.:480; I.V.:902.6; IV Piggyback:626.3] Out: 800 [Urine:800]   Intake/Output this shift:  Total I/O In: 128 [P.O.:120; I.V.:8] Out: -   Physical Exam: General: NAD, laying in bed  Head: Normocephalic, atraumatic. Moist oral mucosal membranes  Eyes: Anicteric, PERRL  Neck: Supple, trachea midline  Lungs:  Clear to auscultation  Heart: Regular rate and rhythm  Abdomen:  Soft, nontender,   Extremities:  no peripheral edema.  Neurologic: Nonfocal, moving all four extremities  Skin: No lesions  Access none    Basic Metabolic Panel: Recent Labs  Lab 05/21/19 1650 05/21/19 1850 05/21/19 1901 05/22/19 0450 05/22/19 2049 05/23/19 0432  NA 134*  --   --  135  --  136  K 2.6*  --   --  2.6* 3.1* 3.5  CL 96*  --   --  99  --  101  CO2 25  --   --  24  --  25  GLUCOSE 109*  --   --  124*  --  110*  BUN 52*  --   --  52*  --  59*  CREATININE 5.23*  --   --  5.51*  --  5.85*  CALCIUM 8.9  --   --  8.5*  --  8.9  MG  --  2.1  --  2.0  --   --   PHOS  --   --  4.4 4.7*  --   --     Liver Function Tests: Recent Labs  Lab 05/22/19 0450  ALBUMIN 3.8   No results for input(s): LIPASE, AMYLASE in the last 168 hours. No results for input(s): AMMONIA in the last 168 hours.  CBC: Recent Labs  Lab 05/21/19 1650  05/22/19 0450  WBC 10.3 10.1  HGB 10.8* 10.2*  HCT 30.5* 27.4*  MCV 86.6 84.0  PLT 68* 80*    Cardiac Enzymes: Recent Labs  Lab 05/21/19 1830  CKTOTAL 540*    BNP: Invalid input(s): POCBNP  CBG: Recent Labs  Lab 05/22/19 0932  GLUCAP 125*    Microbiology: Results for orders placed or performed during the hospital encounter of 05/21/19  SARS CORONAVIRUS 2 (TAT 6-24 HRS) Nasopharyngeal Nasopharyngeal Swab     Status: None   Collection Time: 05/21/19  6:00 PM   Specimen: Nasopharyngeal Swab  Result Value Ref Range Status   SARS Coronavirus 2 NEGATIVE NEGATIVE Final    Comment: (NOTE) SARS-CoV-2 target nucleic acids are NOT DETECTED. The SARS-CoV-2 RNA is generally detectable in upper and lower respiratory specimens during the acute phase of infection. Negative results do not preclude SARS-CoV-2 infection, do not rule out co-infections with other pathogens, and should not be used as the sole basis for treatment or other patient management decisions. Negative results must be  combined with clinical observations, patient history, and epidemiological information. The expected result is Negative. Fact Sheet for Patients: SugarRoll.be Fact Sheet for Healthcare Providers: https://www.woods-mathews.com/ This test is not yet approved or cleared by the [place] FDA and  has been authorized for detection and/or diagnosis of SARS-CoV-2 by FDA under an Emergency Use Authorization (EUA). This EUA will remain  in effect (meaning this test can be used) for the duration of the COVID-19 declaration under Section 56 4(b)(1) of the Act, 21 U.S.C. section 360bbb-3(b)(1), unless the authorization is terminated or revoked sooner. Performed at [facility], [place] [address] [place], [place] [address]   MRSA PCR Screening     Status: None   Collection Time: 05/22/19  7:00 AM   Specimen: Nasal Mucosa; Nasopharyngeal  Result Value Ref  Range Status   MRSA by PCR NEGATIVE NEGATIVE Final    Comment:        The GeneXpert MRSA Assay (FDA approved for NASAL specimens only), is one component of a comprehensive MRSA colonization surveillance program. It is not intended to diagnose MRSA infection nor to guide or monitor treatment for MRSA infections. Performed at [facility], [address] [place], [place] [address]     Coagulation Studies: Recent Labs    05/21/19 1901  LABPROT 13.4  INR 1.0    Urinalysis: Recent Labs    05/21/19 1830  COLORURINE YELLOW*  LABSPEC 1.015  PHURINE 6.0  GLUCOSEU 50*  HGBUR LARGE*  BILIRUBINUR NEGATIVE  KETONESUR NEGATIVE  PROTEINUR >=300*  NITRITE NEGATIVE  LEUKOCYTESUR NEGATIVE      Imaging: CT Head Wo Contrast  Result Date: 05/21/2019 CLINICAL DATA:  Headache EXAM: CT HEAD WITHOUT CONTRAST TECHNIQUE: Contiguous axial images were obtained from the base of the skull through the vertex without intravenous contrast. COMPARISON:  None. FINDINGS: Brain: No acute intracranial abnormality. Specifically, no hemorrhage, hydrocephalus, mass lesion, acute infarction, or significant intracranial injury. Vascular: No hyperdense vessel or unexpected calcification. Skull: No acute calvarial abnormality. Sinuses/Orbits: Visualized paranasal sinuses and mastoids clear. Orbital soft tissues unremarkable. Other: None IMPRESSION: Normal study. Electronically Signed   By: Rolm Baptise M.D.   On: 05/21/2019 17:54   US RENAL  Result Date: 05/22/2019 CLINICAL DATA:  Initial evaluation for acute renal failure, hematuria, nephrolithiasis. EXAM: RENAL / URINARY TRACT ULTRASOUND COMPLETE COMPARISON:  Prior CT from 07/31/2014. FINDINGS: Right Kidney: Renal measurements: 9.6 x 4.9 x 5.0 cm = volume: 124 mL. Diffusely increased echogenicity seen within the renal parenchyma. 6 mm nonobstructive stone present at the upper pole. No hydronephrosis. No focal renal mass. Left Kidney: Renal measurements:  9.6 x 5.1 x 4.4 cm = volume: 113 mL. Diffusely increased echogenicity seen within the renal parenchyma. 5 mm nonobstructive stone present at the interpolar region. No hydronephrosis. No focal renal mass. Bladder: Appears normal for degree of bladder distention. Other: None. IMPRESSION: 1. Increased echogenicity within the renal parenchyma, compatible with medical renal disease. 2. Bilateral nonobstructive nephrolithiasis as above. No hydronephrosis. Electronically Signed   By: Jeannine Boga M.D.   On: 05/22/2019 00:55   ECHOCARDIOGRAM COMPLETE  Result Date: 05/23/2019    ECHOCARDIOGRAM REPORT   Patient Name:   Jeffrey Maynard Date of Exam: 05/22/2019 Medical Rec #:  [id number]          Height:       68.0 in Accession #:    [id number]         Weight:       179.9 lb Date of  Birth:  [birth year]         BSA:          1.954 m Patient Age:    33 years           BP:           185/127 mmHg Patient Gender: M                  HR:           98 bpm. Exam Location:  [facility] Procedure: 2D Echo, Cardiac Doppler and Color Doppler Indications:     R94.31 Abnormal EKG  History:         Patient has no prior history of Echocardiogram examinations.                  Risk Factors:Hypertension.  Sonographer:     Wilford Sports Rodgers-Jones Referring Phys:  [place] Diagnosing Phys: Nelva Bush MD IMPRESSIONS  1. Left ventricular ejection fraction, by estimation, is 55 to 60%. The left ventricle has normal function. The left ventricle has no regional wall motion abnormalities. There is moderate left ventricular hypertrophy. Left ventricular diastolic parameters are consistent with Grade II diastolic dysfunction (pseudonormalization).  2. Right ventricular systolic function is normal. The right ventricular size is normal.  3. Left atrial size was mildly dilated.  4. The mitral valve is normal in structure. Mild mitral valve regurgitation. No evidence of mitral stenosis.  5. The aortic valve is tricuspid. Aortic valve  regurgitation is not visualized. No aortic stenosis is present.  6. The inferior vena cava is normal in size with greater than 50% respiratory variability, suggesting right atrial pressure of 3 mmHg. FINDINGS  Left Ventricle: Left ventricular ejection fraction, by estimation, is 55 to 60%. The left ventricle has normal function. The left ventricle has no regional wall motion abnormalities. The left ventricular internal cavity size was normal in size. There is  moderate left ventricular hypertrophy. Left ventricular diastolic parameters are consistent with Grade II diastolic dysfunction (pseudonormalization). Right Ventricle: The right ventricular size is normal. No increase in right ventricular wall thickness. Right ventricular systolic function is normal. Left Atrium: Left atrial size was mildly dilated. Right Atrium: Right atrial size was normal in size. Pericardium: There is no evidence of pericardial effusion. Mitral Valve: The mitral valve is normal in structure. Mild mitral valve regurgitation. No evidence of mitral valve stenosis. Tricuspid Valve: The tricuspid valve is normal in structure. Tricuspid valve regurgitation is trivial. Aortic Valve: The aortic valve is tricuspid. Aortic valve regurgitation is not visualized. No aortic stenosis is present. Pulmonic Valve: The pulmonic valve was normal in structure. Pulmonic valve regurgitation is not visualized. No evidence of pulmonic stenosis. Aorta: The aortic root is normal in size and structure. Pulmonary Artery: The pulmonary artery is of normal size. Venous: The inferior vena cava is normal in size with greater than 50% respiratory variability, suggesting right atrial pressure of 3 mmHg. IAS/Shunts: The interatrial septum was not well visualized.  LEFT VENTRICLE PLAX 2D LVIDd:         5.09 cm  Diastology LVIDs:         3.55 cm  LV e' lateral:   5.44 cm/s LV PW:         1.48 cm  LV E/e' lateral: 15.4 LV IVS:        1.29 cm  LV e' medial:    7.29 cm/s LVOT  diam:     2.20 cm  LV E/e'  medial:  11.5 LV SV:         61 LV SV Index:   31 LVOT Area:     3.80 cm  RIGHT VENTRICLE RV Basal diam:  3.62 cm RV S prime:     17.00 cm/s TAPSE (M-mode): 2.0 cm LEFT ATRIUM             Index       RIGHT ATRIUM           Index LA diam:        4.60 cm 2.35 cm/m  RA Area:     13.60 cm LA Vol (A2C):   55.1 ml 28.20 ml/m RA Volume:   39.10 ml  20.01 ml/m LA Vol (A4C):   59.6 ml 30.51 ml/m LA Biplane Vol: 58.3 ml 29.84 ml/m  AORTIC VALVE LVOT Vmax:   101.40 cm/s LVOT Vmean:  71.950 cm/s LVOT VTI:    0.162 m  AORTA Ao Root diam: 3.50 cm MITRAL VALVE MV Area (PHT): 4.31 cm    SHUNTS MV Decel Time: 176 msec    Systemic VTI:  0.16 m MV E velocity: 83.60 cm/s  Systemic Diam: 2.20 cm MV A velocity: 53.70 cm/s MV E/A ratio:  1.56 Harrell Gave End MD Electronically signed by Nelva Bush MD Signature Date/Time: 05/23/2019/7:02:59 AM    Final      Medications:   . sodium chloride Stopped (05/23/19 0447)   . amLODipine  10 mg Oral Daily  . carvedilol  25 mg Oral BID WC  . Chlorhexidine Gluconate Cloth  6 each Topical Daily  . hydrALAZINE  50 mg Oral TID  . sodium chloride flush  3 mL Intravenous Q12H   sodium chloride, acetaminophen **OR** acetaminophen, hydrALAZINE, ondansetron **OR** ondansetron (ZOFRAN) IV  Assessment/ Plan:  Jeffrey Maynard is a 34 y.o. black male with no significant past medical history, who was admitted to [facility] on 05/21/2019 for    1. Acute renal failure with proteinuria: baseline creatinine of 1.17, GFR was normal on 07/31/2014. Most likely progression to end stage renal disease.  Nephrotic range proteinuria.  Renal ultrasound with diffuse echogenicity consistent with chronic kidney disease.  Unfortunately not a good candidate for renal biopsy.  No signs of renal recovery. Patient wants to go ahead and start dialysis.  - Consult vascular to place   2. Hematuria: with nephrolithiasis on ultrasound. Hematuria was present in 2016.    3.  Hypertensive emergency: placed on nicardipine gtt on admission.  - Started on hydralazine, amlodipine and carvedilol  - Will need to start a fourth agent if further control is needed.  - Dialysis will help blood pressures.   4. Hypokalemia - potassium replaced.   5. Anemia with chronic kidney disease. Hemogloibn 10.2, normocytic.  - Check iron studies.   6. Secondary Hyperparathyroidism: PTH 159. Phosphorus and calcium at goal.   7. Thrombocytopenia: improved to 80K  - [id number] pending    LOS: 2 Areatha Kalata 4/7/20215:26 PM

## 2019-05-24 ENCOUNTER — Inpatient Hospital Stay: Payer: 59 | Admitting: Certified Registered Nurse Anesthetist

## 2019-05-24 ENCOUNTER — Encounter: Payer: Self-pay | Admitting: Internal Medicine

## 2019-05-24 ENCOUNTER — Other Ambulatory Visit: Payer: Self-pay

## 2019-05-24 ENCOUNTER — Encounter
Admission: EM | Disposition: A | Discharge status: Home / Self Care | Payer: Self-pay | Source: Ambulatory Visit | Attending: Internal Medicine

## 2019-05-24 DIAGNOSIS — N200 Calculus of kidney: Secondary | ICD-10-CM

## 2019-05-24 DIAGNOSIS — N185 Chronic kidney disease, stage 5: Secondary | ICD-10-CM

## 2019-05-24 DIAGNOSIS — N179 Acute kidney failure, unspecified: Secondary | ICD-10-CM

## 2019-05-24 DIAGNOSIS — R319 Hematuria, unspecified: Secondary | ICD-10-CM

## 2019-05-24 DIAGNOSIS — I161 Hypertensive emergency: Secondary | ICD-10-CM

## 2019-05-24 HISTORY — PX: CAPD INSERTION: SHX5233

## 2019-05-24 LAB — BASIC METABOLIC PANEL
Anion gap: 10 (ref 5–15)
BUN: 79 mg/dL — ABNORMAL HIGH (ref 6–20)
CO2: 24 mmol/L (ref 22–32)
Calcium: 8.8 mg/dL — ABNORMAL LOW (ref 8.9–10.3)
Chloride: 101 mmol/L (ref 98–111)
Creatinine, Ser: 6.59 mg/dL — ABNORMAL HIGH (ref 0.61–1.24)
GFR calc Af Amer: 12 mL/min — ABNORMAL LOW (ref >60)
GFR calc non Af Amer: 10 mL/min — ABNORMAL LOW (ref >60)
Glucose, Bld: 103 mg/dL — ABNORMAL HIGH (ref 70–99)
Potassium: 3.1 mmol/L — ABNORMAL LOW (ref 3.5–5.1)
Sodium: 135 mmol/L (ref 135–145)

## 2019-05-24 LAB — VITAMIN B12: Vitamin B-12: 296 pg/mL (ref 180–914)

## 2019-05-24 LAB — TYPE AND SCREEN
ABO/RH(D): O POS
Antibody Screen: NEGATIVE

## 2019-05-24 LAB — CBC
HCT: 26.4 % — ABNORMAL LOW (ref 39.0–52.0)
Hemoglobin: 9 g/dL — ABNORMAL LOW (ref 13.0–17.0)
MCH: 30.6 pg (ref 26.0–34.0)
MCHC: 34.1 g/dL (ref 30.0–36.0)
MCV: 89.8 fL (ref 80.0–100.0)
Platelets: 121 10*3/uL — ABNORMAL LOW (ref 150–400)
RBC: 2.94 MIL/uL — ABNORMAL LOW (ref 4.22–5.81)
RDW: 13.7 % (ref 11.5–15.5)
WBC: 8 10*3/uL (ref 4.0–10.5)
nRBC: 0 % (ref 0.0–0.2)

## 2019-05-24 LAB — ABO/RH: ABO/RH(D): O POS

## 2019-05-24 LAB — FOLATE: Folate: 13.4 ng/mL (ref >5.9)

## 2019-05-24 LAB — ADAMTS13 ACTIVITY: Adamts 13 Activity: 77.6 % (ref >66.8)

## 2019-05-24 LAB — IRON AND TIBC
Iron: 66 ug/dL (ref 45–182)
Saturation Ratios: 25 % (ref 17.9–39.5)
TIBC: 265 ug/dL (ref 250–450)
UIBC: 199 ug/dL

## 2019-05-24 LAB — MAGNESIUM: Magnesium: 2.1 mg/dL (ref 1.7–2.4)

## 2019-05-24 LAB — HEMOGLOBIN A1C
Hgb A1c MFr Bld: 4.3 % — ABNORMAL LOW (ref 4.8–5.6)
Mean Plasma Glucose: 77 mg/dL

## 2019-05-24 LAB — FERRITIN: Ferritin: 882 ng/mL — ABNORMAL HIGH (ref 24–336)

## 2019-05-24 LAB — APTT: aPTT: 30 seconds (ref 24–36)

## 2019-05-24 LAB — PROTIME-INR
INR: 1.1 (ref 0.8–1.2)
Prothrombin Time: 14.2 seconds (ref 11.4–15.2)

## 2019-05-24 LAB — MPO/PR-3 (ANCA) ANTIBODIES
ANCA Proteinase 3: <3.5 U/mL (ref 0.0–3.5)
Myeloperoxidase Abs: <9 U/mL (ref 0.0–9.0)

## 2019-05-24 LAB — ADAMTS13 ACTIVITY REFLEX

## 2019-05-24 SURGERY — LAPAROSCOPIC INSERTION CONTINUOUS AMBULATORY PERITONEAL DIALYSIS  (CAPD) CATHETER
Anesthesia: General

## 2019-05-24 MED ORDER — VASOPRESSIN 20 UNIT/ML IV SOLN
INTRAVENOUS | Status: DC | PRN
  Administered 2019-05-24: 1 [IU] via INTRAVENOUS

## 2019-05-24 MED ORDER — PROMETHAZINE HCL 25 MG/ML IJ SOLN: 6.2500 mg | INTRAMUSCULAR | Status: DC | PRN

## 2019-05-24 MED ORDER — SODIUM CHLORIDE 0.9 % IV SOLN
INTRAVENOUS | Status: DC | PRN
  Administered 2019-05-24: 50 ug/min via INTRAVENOUS

## 2019-05-24 MED ORDER — ACETAMINOPHEN 10 MG/ML IV SOLN
INTRAVENOUS | Status: DC | PRN
  Administered 2019-05-24: 1000 mg via INTRAVENOUS

## 2019-05-24 MED ORDER — SODIUM CHLORIDE 0.9 % IV SOLN: INTRAVENOUS | Status: DC

## 2019-05-24 MED ORDER — FENTANYL CITRATE (PF) 100 MCG/2ML IJ SOLN
INTRAMUSCULAR | Status: AC
  Filled 2019-05-24: qty 2

## 2019-05-24 MED ORDER — BACITRACIN ZINC 500 UNIT/GM EX OINT
TOPICAL_OINTMENT | CUTANEOUS | Status: DC | PRN
  Administered 2019-05-24: 1 via TOPICAL

## 2019-05-24 MED ORDER — FENTANYL CITRATE (PF) 100 MCG/2ML IJ SOLN
INTRAMUSCULAR | Status: AC
  Administered 2019-05-24: 25 ug via INTRAVENOUS
  Filled 2019-05-24: qty 2

## 2019-05-24 MED ORDER — ROCURONIUM BROMIDE 100 MG/10ML IV SOLN
INTRAVENOUS | Status: DC | PRN
  Administered 2019-05-24: 50 mg via INTRAVENOUS

## 2019-05-24 MED ORDER — LIDOCAINE HCL (CARDIAC) PF 100 MG/5ML IV SOSY
PREFILLED_SYRINGE | INTRAVENOUS | Status: DC | PRN
  Administered 2019-05-24: 60 mg via INTRAVENOUS

## 2019-05-24 MED ORDER — ACETAMINOPHEN 10 MG/ML IV SOLN
INTRAVENOUS | Status: AC
  Filled 2019-05-24: qty 100

## 2019-05-24 MED ORDER — POTASSIUM CHLORIDE CRYS ER 20 MEQ PO TBCR
40.0000 meq | EXTENDED_RELEASE_TABLET | Freq: Once | ORAL | Status: AC
  Administered 2019-05-24: 40 meq via ORAL
  Filled 2019-05-24: qty 2

## 2019-05-24 MED ORDER — FENTANYL CITRATE (PF) 100 MCG/2ML IJ SOLN
25.0000 ug | INTRAMUSCULAR | Status: DC | PRN
  Administered 2019-05-24 (×3): 25 ug via INTRAVENOUS

## 2019-05-24 MED ORDER — ACETAMINOPHEN 160 MG/5ML PO SOLN
325.0000 mg | ORAL | Status: DC | PRN
  Filled 2019-05-24: qty 20.3

## 2019-05-24 MED ORDER — HYDROCODONE-ACETAMINOPHEN 7.5-325 MG PO TABS
1.0000 | ORAL_TABLET | Freq: Once | ORAL | Status: DC | PRN
  Filled 2019-05-24: qty 1

## 2019-05-24 MED ORDER — PROPOFOL 10 MG/ML IV BOLUS
INTRAVENOUS | Status: AC
  Filled 2019-05-24: qty 20

## 2019-05-24 MED ORDER — SUGAMMADEX SODIUM 200 MG/2ML IV SOLN
INTRAVENOUS | Status: DC | PRN
  Administered 2019-05-24: 329.6 mg via INTRAVENOUS

## 2019-05-24 MED ORDER — FENTANYL CITRATE (PF) 100 MCG/2ML IJ SOLN
INTRAMUSCULAR | Status: DC | PRN
  Administered 2019-05-24: 100 ug via INTRAVENOUS

## 2019-05-24 MED ORDER — TRAMADOL HCL 50 MG PO TABS
50.0000 mg | ORAL_TABLET | Freq: Three times a day (TID) | ORAL | Status: DC | PRN
  Administered 2019-05-24 – 2019-05-25 (×2): 50 mg via ORAL
  Filled 2019-05-24 (×2): qty 1

## 2019-05-24 MED ORDER — CEFAZOLIN SODIUM-DEXTROSE 1-4 GM/50ML-% IV SOLN
1.0000 g | Freq: Once | INTRAVENOUS | Status: AC
  Administered 2019-05-24: 1 g via INTRAVENOUS

## 2019-05-24 MED ORDER — EPHEDRINE SULFATE 50 MG/ML IJ SOLN
INTRAMUSCULAR | Status: DC | PRN
  Administered 2019-05-24: 5 mg via INTRAVENOUS
  Administered 2019-05-24: 10 mg via INTRAVENOUS

## 2019-05-24 MED ORDER — BACITRACIN ZINC 500 UNIT/GM EX OINT
TOPICAL_OINTMENT | CUTANEOUS | Status: AC
  Filled 2019-05-24: qty 28.35

## 2019-05-24 MED ORDER — PROPOFOL 10 MG/ML IV BOLUS
INTRAVENOUS | Status: DC | PRN
  Administered 2019-05-24: 180 mg via INTRAVENOUS
  Administered 2019-05-24: 20 mg via INTRAVENOUS

## 2019-05-24 MED ORDER — PHENYLEPHRINE HCL (PRESSORS) 10 MG/ML IV SOLN
INTRAVENOUS | Status: DC | PRN
  Administered 2019-05-24: 100 ug via INTRAVENOUS
  Administered 2019-05-24: 200 ug via INTRAVENOUS
  Administered 2019-05-24: 100 ug via INTRAVENOUS
  Administered 2019-05-24 (×2): 200 ug via INTRAVENOUS

## 2019-05-24 MED ORDER — CEFAZOLIN SODIUM-DEXTROSE 1-4 GM/50ML-% IV SOLN
INTRAVENOUS | Status: AC
  Filled 2019-05-24: qty 50

## 2019-05-24 MED ORDER — ACETAMINOPHEN 325 MG PO TABS: 325.0000 mg | ORAL_TABLET | ORAL | Status: DC | PRN

## 2019-05-24 SURGICAL SUPPLY — 36 items
"PENCIL ELECTRO HAND CTR " (MISCELLANEOUS) ×1 IMPLANT
ADAPTER BETA CAP QUINTON DIALY (ADAPTER) IMPLANT
ADAPTER CATH DIALYSIS 18.75 (CATHETERS) ×2 IMPLANT
ADAPTER CATH DIALYSIS 18.75CM (CATHETERS) ×1
CANISTER SUCT 1200ML W/VALVE (MISCELLANEOUS) ×3 IMPLANT
CATH DLYS SWAN NECK 62.5CM (CATHETERS) ×3 IMPLANT
CHLORAPREP W/TINT 26 (MISCELLANEOUS) ×3 IMPLANT
COVER WAND RF STERILE (DRAPES) ×3 IMPLANT
DERMABOND ADVANCED (GAUZE/BANDAGES/DRESSINGS) ×2
DERMABOND ADVANCED .7 DNX12 (GAUZE/BANDAGES/DRESSINGS) ×1 IMPLANT
ELECT CAUTERY BLADE 6.4 (BLADE) ×3 IMPLANT
ELECT REM PT RETURN 9FT ADLT (ELECTROSURGICAL) ×3
ELECTRODE REM PT RTRN 9FT ADLT (ELECTROSURGICAL) ×1 IMPLANT
GAUZE SPONGE 4X4 12PLY STRL (GAUZE/BANDAGES/DRESSINGS) ×2 IMPLANT
GLOVE BIO SURGEON STRL SZ7 (GLOVE) ×10 IMPLANT
GLOVE INDICATOR 7.5 STRL GRN (GLOVE) ×3 IMPLANT
GOWN STRL REUS W/ TWL LRG LVL3 (GOWN DISPOSABLE) ×2 IMPLANT
GOWN STRL REUS W/ TWL XL LVL3 (GOWN DISPOSABLE) ×1 IMPLANT
GOWN STRL REUS W/TWL LRG LVL3 (GOWN DISPOSABLE) ×6
GOWN STRL REUS W/TWL XL LVL3 (GOWN DISPOSABLE) ×2
IV NS 500ML (IV SOLUTION) ×2
IV NS 500ML BAXH (IV SOLUTION) ×1 IMPLANT
KIT TURNOVER KIT A (KITS) ×3 IMPLANT
LABEL OR SOLS (LABEL) ×3 IMPLANT
MINICAP W/POVIDONE IODINE SOL (MISCELLANEOUS) ×3 IMPLANT
PACK LAP CHOLECYSTECTOMY (MISCELLANEOUS) ×3 IMPLANT
PENCIL ELECTRO HAND CTR (MISCELLANEOUS) ×3 IMPLANT
SET CYSTO W/LG BORE CLAMP LF (SET/KITS/TRAYS/PACK) ×3 IMPLANT
SET TRANSFER 6 W/TWIST CLAMP 5 (SET/KITS/TRAYS/PACK) ×3 IMPLANT
SET TUBE SMOKE EVAC HIGH FLOW (TUBING) ×3 IMPLANT
SPONGE DRAIN TRACH 4X4 STRL 2S (GAUZE/BANDAGES/DRESSINGS) ×3 IMPLANT
SUT MNCRL AB 4-0 PS2 18 (SUTURE) ×3 IMPLANT
SUT VIC AB 2-0 UR6 27 (SUTURE) ×3 IMPLANT
SUT VICRYL+ 3-0 36IN CT-1 (SUTURE) ×3 IMPLANT
TROCAR XCEL NON-BLD 11X100MML (ENDOMECHANICALS) ×3 IMPLANT
TROCAR XCEL NON-BLD 5MMX100MML (ENDOMECHANICALS) IMPLANT

## 2019-05-24 NOTE — Progress Notes (Signed)
Patient and this RN discussed lifestyle factors that may contribute an elevated blood pressure. Patient informed RN that he has recently lost his mother. He shared his stress-reducing techniques with this RN which included listening to music and working out. This RN asked patient if he would like to speak with someone regarding managing his stress and he stated yes.

## 2019-05-24 NOTE — H&P (Signed)
[facility] History & Physical Update  The patient was interviewed and re-examined.  The patient's previous History and Physical has been reviewed and is unchanged.  There is no change in the plan of care. We plan to proceed with the scheduled procedure.  Leotis Pain, MD  05/24/2019, 10:08 AM

## 2019-05-24 NOTE — Anesthesia Preprocedure Evaluation (Addendum)
Anesthesia Evaluation  Patient identified by MRN, date of birth, ID band Patient awake    Reviewed: Allergy & Precautions, H&P , NPO status , reviewed documented beta blocker date and time   Airway Mallampati: II  TM Distance: >3 FB Neck ROM: full    Dental  (+) Teeth Intact   Pulmonary    Pulmonary exam normal        Cardiovascular hypertension, Normal cardiovascular exam  Recent ECHO IMPRESSIONS  1. Left ventricular ejection fraction, by estimation, is 55 to 60%. The  left ventricle has normal function. The left ventricle has no regional  wall motion abnormalities. There is moderate left ventricular hypertrophy.  Left ventricular diastolic  parameters are consistent with Grade II diastolic dysfunction  (pseudonormalization).  2. Right ventricular systolic function is normal. The right ventricular  size is normal.  3. Left atrial size was mildly dilated.  4. The mitral valve is normal in structure. Mild mitral valve  regurgitation. No evidence of mitral stenosis.  5. The aortic valve is tricuspid. Aortic valve regurgitation is not  visualized. No aortic stenosis is present.  6. The inferior vena cava is normal in size with greater than 50%  respiratory variability, suggesting right atrial pressure of 3 mmHg.   Hospitalist aware of increased troponin and T wave changes on EKG, both felt 2 to HTN crisis/Renal failure. Cleared to proceed w surgery    Neuro/Psych    GI/Hepatic neg GERD  ,  Endo/Other    Renal/GU ARFRenal disease     Musculoskeletal   Abdominal   Peds  Hematology   Anesthesia Other Findings Past Medical History: No date: Hypertension - severe with demand ischemia  History reviewed. No pertinent surgical history. BMI    Body Mass Index: 27.62 kg/m     Reproductive/Obstetrics                           Anesthesia Physical Anesthesia Plan  ASA: IV  Anesthesia  Plan: General   Post-op Pain Management:    Induction: Intravenous  PONV Risk Score and Plan: 2 and Ondansetron, Treatment may vary due to age or medical condition, Midazolam and Dexamethasone  Airway Management Planned: Oral ETT  Additional Equipment:   Intra-op Plan:   Post-operative Plan: Extubation in OR  Informed Consent: I have reviewed the patients History and Physical, chart, labs and discussed the procedure including the risks, benefits and alternatives for the proposed anesthesia with the patient or authorized representative who has indicated his/her understanding and acceptance.     Dental Advisory Given  Plan Discussed with: CRNA  Anesthesia Plan Comments:        Anesthesia Quick Evaluation

## 2019-05-24 NOTE — Op Note (Signed)
  OPERATIVE NOTE   PROCEDURE: 1. Laparoscopic peritoneal dialysis catheter placement.  PRE-OPERATIVE DIAGNOSIS: 1. Renal failure   POST-OPERATIVE DIAGNOSIS: Same  SURGEON: Leotis Pain, MD  ASSISTANT(S): Hezzie Bump, PA-C  ANESTHESIA: general  ESTIMATED BLOOD LOSS: Minimal   FINDING(S): 1. None  SPECIMEN(S): None  INDICATIONS:  Patient presents with renal failure. The patient has decided to do peritoneal dialysis for his long-term dialysis. Risks and benefits of placement were discussed and he is agreeable to proceed.  Differences between peritoneal dialysis and hemodialysis were discussed.  An assistant was present during the procedure to help facilitate the exposure and expedite the procedure.  DESCRIPTION: After obtaining full informed written consent, the patient was brought back to the operating room and placed supine upon the operating table. The patient received IV antibiotics prior to induction. After obtaining adequate anesthesia, the abdomen was prepped and draped in the standard fashion. The assistant provided retraction and mobilization to help facilitate exposure and expedite the procedure throughout the entire procedure.  This included following suture, using retractors, and optimizing lighting. A small transverse incision was created just to the left of the umbilicus and we dissected down to the fascia and placed a pursestring Vicryl suture. I then entered the peritoneum with an 65mm Optiview trocar placed in the right upper quadrant and insufflated the abdomen with carbon dioxide. I then entered the peritoneum just beside the umbilicus with a trocar and the peritoneal dialysis catheter under direct visualization. The coiled portion of the catheter was parked into the pelvis under direct laparoscopic guidance. The deep cuff was secured to the fascial pursestring suture. A small counterincision was made in the left abdomen and the catheter was brought out this site. The  appropriate distal connectors were placed, and I then placed 500 cc of saline through the catheter into the pelvis. The abdomen was desufflated. Immediately, 250 cc of effluent returned through the catheter when the bag was placed to gravity. I took one more look with the camera to ensure that the catheter was in the pelvis and it was. The 2mm trocar was then removed. I then closed the incisions with 3-0 Vicryl and 4-0 Monocryl and placed Dermabond as dressing. Dry dressing was placed around the catheter exit site. The patient was then awakened from anesthesia and taken to the recovery room in stable condition having tolerated the procedure well.  COMPLICATIONS: None  CONDITION: None  Leotis Pain, MD 05/24/2019 10:59 AM   This note was created with Dragon Medical transcription system. Any errors in dictation are purely unintentional.

## 2019-05-24 NOTE — Consult Note (Signed)
PHARMACY CONSULT NOTE - FOLLOW UP  Pharmacy Consult for Electrolyte Monitoring and Replacement   Recent Labs: Potassium (mmol/L)  Date Value  05/24/2019 3.1 (L)   Magnesium (mg/dL)  Date Value  05/24/2019 2.1   Calcium (mg/dL)  Date Value  05/24/2019 8.8 (L)   Albumin (g/dL)  Date Value  05/22/2019 3.8   Phosphorus (mg/dL)  Date Value  05/22/2019 4.7 (H)   Sodium (mmol/L)  Date Value  05/24/2019 135     Assessment: 34 y.o.malewith medical history significant forhypertensionandkidney stonewho presents to the ED from urgent care for evaluation of severe hypertension.He has had some associated blurry vision and a couple days ago had nausea with emesis. Pt with AKI with proteinuria.   Scr: 5.23>>5.85>> 6.59   Goal of Therapy:  WNL  Plan:  4/8: Scr trending up. Will conservatively replace potassium.   Will order KCL 72mEq po x 1 dose this morning. No additional replacement needed at this time.   Pharmacy will continue to follow and replace electrolytes as needed.   Pernell Dupre, PharmD, BCPS Clinical Pharmacist 05/24/2019 7:58 AM

## 2019-05-24 NOTE — Progress Notes (Signed)
Per Dr. Juleen China patient will be an urgent PD start. Spoke with Seth Bake, PD RN, at [facility] to make aware of this patient. Per Seth Bake patient can start training next week MWF. Pending insurance verification.

## 2019-05-24 NOTE — Transfer of Care (Signed)
Immediate Anesthesia Transfer of Care Note  Patient: Jeffrey Maynard  Procedure(s) Performed: LAPAROSCOPIC INSERTION CONTINUOUS AMBULATORY PERITONEAL DIALYSIS  (CAPD) CATHETER (N/A )  Patient Location: PACU  Anesthesia Type:General  Level of Consciousness: awake, alert  and oriented  Airway & Oxygen Therapy: Patient Spontanous Breathing and Patient connected to face mask oxygen  Post-op Assessment: Report given to RN and Post -op Vital signs reviewed and stable  Post vital signs: Reviewed and stable  Last Vitals:  Vitals Value Taken Time  BP 107/53 05/24/19 1115  Temp 36.6 C 05/24/19 1115  Pulse 71 05/24/19 1118  Resp 18 05/24/19 1118  SpO2 100 % 05/24/19 1118  Vitals shown include unvalidated device data.  Last Pain:  Vitals:   05/24/19 0941  TempSrc: Temporal  PainSc: 0-No pain         Complications: No apparent anesthesia complications

## 2019-05-24 NOTE — Progress Notes (Addendum)
[name] [place] NAME: Jeffrey Maynard    MR#:  [id number]  DATE OF BIRTH:  09-10-1985  SUBJECTIVE:  patient just got back from PD catheter placement. Denies any complaints. No headache today Feels overall better  REVIEW OF SYSTEMS:   Review of Systems  Constitutional: Negative for chills, fever and weight loss.  HENT: Negative for ear discharge, ear pain and nosebleeds.   Eyes: Negative for blurred vision, pain and discharge.  Respiratory: Negative for sputum production, shortness of breath, wheezing and stridor.   Cardiovascular: Negative for chest pain, palpitations, orthopnea and PND.  Gastrointestinal: Negative for abdominal pain, diarrhea, nausea and vomiting.  Genitourinary: Negative for frequency and urgency.  Musculoskeletal: Negative for back pain and joint pain.  Neurological: Negative for sensory change, speech change, focal weakness and weakness.  Psychiatric/Behavioral: Negative for depression and hallucinations. The patient is not nervous/anxious.    Tolerating Diet:yes Tolerating PT: not needed  DRUG ALLERGIES:  No Known Allergies  VITALS:  Blood pressure 113/69, pulse 64, temperature 98.8 F (37.1 C), resp. rate 17, height 5\' 8"  (1.727 m), weight 82.4 kg, SpO2 98 %.  PHYSICAL EXAMINATION:   Physical Exam  GENERAL:  34 y.o.-year-old patient lying in the bed with no acute distress.  EYES: Pupils equal, round, reactive to light and accommodation. No scleral icterus.   HEENT: Head atraumatic, normocephalic. Oropharynx and nasopharynx clear.  NECK:  Supple, no jugular venous distention. No thyroid enlargement, no tenderness.  LUNGS: Normal breath sounds bilaterally, no wheezing, rales, rhonchi. No use of accessory muscles of respiration.  CARDIOVASCULAR: S1, S2 normal. No murmurs, rubs, or gallops.  ABDOMEN: Soft, nontender, nondistended. Bowel sounds present. No organomegaly or mass. PD Cath+ EXTREMITIES: No  cyanosis, clubbing or edema b/l.    NEUROLOGIC: Cranial nerves II through XII are intact. No focal Motor or sensory deficits b/l.   PSYCHIATRIC:  patient is alert and oriented x 3.  SKIN: No obvious rash, lesion, or ulcer.   LABORATORY PANEL:  CBC Recent Labs  Lab 05/24/19 0442  WBC 8.0  HGB 9.0*  HCT 26.4*  PLT 121*    Chemistries  Recent Labs  Lab 05/24/19 0442  NA 135  K 3.1*  CL 101  CO2 24  GLUCOSE 103*  BUN 79*  CREATININE 6.59*  CALCIUM 8.8*  MG 2.1   Cardiac Enzymes No results for input(s): TROPONINI in the last 168 hours. RADIOLOGY:  ECHOCARDIOGRAM COMPLETE  Result Date: 05/23/2019    ECHOCARDIOGRAM REPORT   Patient Name:   Jeffrey Maynard Date of Exam: 05/22/2019 Medical Rec #:  [id number]          Height:       68.0 in Accession #:    [id number]         Weight:       179.9 lb Date of Birth:  20-Feb-1985         BSA:          1.954 m Patient Age:    33 years           BP:           185/127 mmHg Patient Gender: M                  HR:           98 bpm. Exam Location:  [facility] Procedure: 2D Echo, Cardiac Doppler and Color Doppler Indications:     R94.31 Abnormal EKG  History:         Patient has no prior history of Echocardiogram examinations.                  Risk Factors:Hypertension.  Sonographer:     Jeffrey Maynard Jeffrey Maynard Referring Phys:  Braman Diagnosing Phys: Jeffrey Bush MD IMPRESSIONS  1. Left ventricular ejection fraction, by estimation, is 55 to 60%. The left ventricle has normal function. The left ventricle has no regional wall motion abnormalities. There is moderate left ventricular hypertrophy. Left ventricular diastolic parameters are consistent with Grade II diastolic dysfunction (pseudonormalization).  2. Right ventricular systolic function is normal. The right ventricular size is normal.  3. Left atrial size was mildly dilated.  4. The mitral valve is normal in structure. Mild mitral valve regurgitation. No evidence of mitral stenosis.  5. The  aortic valve is tricuspid. Aortic valve regurgitation is not visualized. No aortic stenosis is present.  6. The inferior vena cava is normal in size with greater than 50% respiratory variability, suggesting right atrial pressure of 3 mmHg. FINDINGS  Left Ventricle: Left ventricular ejection fraction, by estimation, is 55 to 60%. The left ventricle has normal function. The left ventricle has no regional wall motion abnormalities. The left ventricular internal cavity size was normal in size. There is  moderate left ventricular hypertrophy. Left ventricular diastolic parameters are consistent with Grade II diastolic dysfunction (pseudonormalization). Right Ventricle: The right ventricular size is normal. No increase in right ventricular wall thickness. Right ventricular systolic function is normal. Left Atrium: Left atrial size was mildly dilated. Right Atrium: Right atrial size was normal in size. Pericardium: There is no evidence of pericardial effusion. Mitral Valve: The mitral valve is normal in structure. Mild mitral valve regurgitation. No evidence of mitral valve stenosis. Tricuspid Valve: The tricuspid valve is normal in structure. Tricuspid valve regurgitation is trivial. Aortic Valve: The aortic valve is tricuspid. Aortic valve regurgitation is not visualized. No aortic stenosis is present. Pulmonic Valve: The pulmonic valve was normal in structure. Pulmonic valve regurgitation is not visualized. No evidence of pulmonic stenosis. Aorta: The aortic root is normal in size and structure. Pulmonary Artery: The pulmonary artery is of normal size. Venous: The inferior vena cava is normal in size with greater than 50% respiratory variability, suggesting right atrial pressure of 3 mmHg. IAS/Shunts: The interatrial septum was not well visualized.  LEFT VENTRICLE PLAX 2D LVIDd:         5.09 cm  Diastology LVIDs:         3.55 cm  LV e' lateral:   5.44 cm/s LV PW:         1.48 cm  LV E/e' lateral: 15.4 LV IVS:         1.29 cm  LV e' medial:    7.29 cm/s LVOT diam:     2.20 cm  LV E/e' medial:  11.5 LV SV:         61 LV SV Index:   31 LVOT Area:     3.80 cm  RIGHT VENTRICLE RV Basal diam:  3.62 cm RV S prime:     17.00 cm/s TAPSE (M-mode): 2.0 cm LEFT ATRIUM             Index       RIGHT ATRIUM           Index LA diam:        4.60 cm 2.35 cm/m  RA Area:     13.60 cm LA  Vol (A2C):   55.1 ml 28.20 ml/m RA Volume:   39.10 ml  20.01 ml/m LA Vol (A4C):   59.6 ml 30.51 ml/m LA Biplane Vol: 58.3 ml 29.84 ml/m  AORTIC VALVE LVOT Vmax:   101.40 cm/s LVOT Vmean:  71.950 cm/s LVOT VTI:    0.162 m  AORTA Ao Root diam: 3.50 cm MITRAL VALVE MV Area (PHT): 4.31 cm    SHUNTS MV Decel Time: 176 msec    Systemic VTI:  0.16 m MV E velocity: 83.60 cm/s  Systemic Diam: 2.20 cm MV A velocity: 53.70 cm/s MV E/A ratio:  1.56 Harrell Gave End MD Electronically signed by Jeffrey Bush MD Signature Date/Time: 05/23/2019/7:02:59 AM    Final    ASSESSMENT AND PLAN:   Jeffrey Maynard is a 34 y.o. male with medical history significant for hypertension and kidney stone who presents to the ED from urgent care for evaluation of severe hypertension.He says over the last 2 weeks he has been having intermittent left temporal headache described as a quick sharp discomfort.  He has had some associated blurry vision and a couple days ago had nausea with emesis.  Acute renal failure: -Only prior labs available showed creatinine 1.17 with GFR >60 from June 2016. - Creatinine now 5.23 with GFR 16 at time of admission.  - Nephrology Dr Juleen China  Recommends starting peritoneal dialysis while in house. -4/8--Status post PD cath placement -differential seems to be FSGS -Renal ultrasound showed CKD changes with bilateral nonobstructive kidney stones  Hypertensive emergency: -was on IV nicardipine with initial goal SBP 180 in the first hour then gradual decrease to SBP 160--now weaned to off -on po amlodipine, coreg -4/7 added hydralazine and  imdur -prn IV hydralazine  Thrombocytopenia: -Platelets 68,000--> 80K  No obvious signs of bleeding.  - Continue renal work-up as above as well as AdamsTS13 activity.  - Hold pharmacologic VTE prophylaxis. -LDH >600 -pathology -- few fragmented RBCs. No evidence of TTP  Elevated troponin: Suspect demand ischemia in the setting of acute renal failure and hypertensive emergency.  Nonspecific T wave changes on EKG likely from severe hypertension.  -check echo -no cp  Hypokalemia: -K 3.1 -pharmacy to replace  Anxiety with significant social stressors -patient requesting to speak with psychiatrist.   DVT prophylaxis: SCDs due to low plts Code Status: Full code, confirmed with patient Family Communication: Discussed with patient spouse at bedside yday. None today Disposition Plan: From home, likely discharge to home pending further work-up of renal failure and management of hypertensive emergency. -Peritoneal dialysis to be initiated today. Anticipate discharge 1 to 2 days Consults called: Nephrology    TOTAL TIME TAKING CARE OF THIS PATIENT: 25 minutes.  >50% time spent on counselling and coordination of care  Note: This dictation was prepared with Dragon dictation along with smaller phrase technology. Any transcriptional errors that result from this process are unintentional.  Fritzi Mandes M.D    [facility]   CC: Primary care physician; System, Pcp Not InPatient ID: Jeffrey Maynard, male   DOB: 12-20-1985, 34 y.o.   MRN: [id number]

## 2019-05-24 NOTE — Anesthesia Postprocedure Evaluation (Signed)
Anesthesia Post Note  Patient: Renaldo Harrison  Procedure(s) Performed: LAPAROSCOPIC INSERTION CONTINUOUS AMBULATORY PERITONEAL DIALYSIS  (CAPD) CATHETER (N/A )  Patient location during evaluation: PACU Anesthesia Type: General Level of consciousness: awake and alert Pain management: pain level controlled Vital Signs Assessment: post-procedure vital signs reviewed and stable Respiratory status: spontaneous breathing, nonlabored ventilation, respiratory function stable and patient connected to nasal cannula oxygen Cardiovascular status: blood pressure returned to baseline and stable Postop Assessment: no apparent nausea or vomiting Anesthetic complications: no     Last Vitals:  Vitals:   05/24/19 1300 05/24/19 1315  BP: (!) 112/58 113/69  Pulse: 63 64  Resp: 12 17  Temp:  37.1 C  SpO2: 95% 98%    Last Pain:  Vitals:   05/24/19 1315  TempSrc:   PainSc: 4                  Arita Miss

## 2019-05-24 NOTE — Progress Notes (Signed)
[facility]  ROUNDING NOTE   Subjective:   PD catheter placed this morning by Dr. Lucky Cowboy.   Objective:  Vital signs in last 24 hours:  Temp:  [97.9 F (36.6 C)-98.9 F (37.2 C)] 98.8 F (37.1 C) (04/08 1315) Pulse Rate:  [59-85] 64 (04/08 1315) Resp:  [10-19] 17 (04/08 1315) BP: (91-167)/(49-87) 113/69 (04/08 1315) SpO2:  [95 %-100 %] 98 % (04/08 1315)  Weight change:  Filed Weights   05/21/19 1634 05/22/19 0940 05/23/19 0521  Weight: 81.6 kg 81.6 kg 82.4 kg    Intake/Output: I/O last 3 completed shifts: In: 451.6 [P.O.:120; I.V.:31.6; IV Piggyback:300] Out: 200 [Urine:200]   Intake/Output this shift:  Total I/O In: 350 [I.V.:350] Out: -   Physical Exam: General: NAD, laying in bed  Head: Normocephalic, atraumatic. Moist oral mucosal membranes  Eyes: Anicteric, PERRL  Neck: Supple, trachea midline  Lungs:  Clear to auscultation  Heart: Regular rate and rhythm  Abdomen:  Soft, nontender,   Extremities:  no peripheral edema.  Neurologic: Nonfocal, moving all four extremities  Skin: No lesions  Access +PDC    Basic Metabolic Panel: Recent Labs  Lab 05/21/19 1650 05/21/19 1650 05/21/19 1850 05/21/19 1901 05/22/19 0450 05/22/19 2049 05/23/19 0432 05/24/19 0442  NA 134*  --   --   --  135  --  136 135  K 2.6*  --   --   --  2.6* 3.1* 3.5 3.1*  CL 96*  --   --   --  99  --  101 101  CO2 25  --   --   --  24  --  25 24  GLUCOSE 109*  --   --   --  124*  --  110* 103*  BUN 52*  --   --   --  52*  --  59* 79*  CREATININE 5.23*  --   --   --  5.51*  --  5.85* 6.59*  CALCIUM 8.9   < >  --   --  8.5*  --  8.9 8.8*  MG  --   --  2.1  --  2.0  --   --  2.1  PHOS  --   --   --  4.4 4.7*  --   --   --    < > = values in this interval not displayed.    Liver Function Tests: Recent Labs  Lab 05/22/19 0450  ALBUMIN 3.8   No results for input(s): LIPASE, AMYLASE in the last 168 hours. No results for input(s): AMMONIA in the last 168  hours.  CBC: Recent Labs  Lab 05/21/19 1650 05/22/19 0450 05/24/19 0442  WBC 10.3 10.1 8.0  HGB 10.8* 10.2* 9.0*  HCT 30.5* 27.4* 26.4*  MCV 86.6 84.0 89.8  PLT 68* 80* 121*    Cardiac Enzymes: Recent Labs  Lab 05/21/19 1830  CKTOTAL 540*    BNP: Invalid input(s): POCBNP  CBG: Recent Labs  Lab 05/22/19 0932  GLUCAP 125*    Microbiology: Results for orders placed or performed during the hospital encounter of 05/21/19  SARS CORONAVIRUS 2 (TAT 6-24 HRS) Nasopharyngeal Nasopharyngeal Swab     Status: None   Collection Time: 05/21/19  6:00 PM   Specimen: Nasopharyngeal Swab  Result Value Ref Range Status   SARS Coronavirus 2 NEGATIVE NEGATIVE Final    Comment: (NOTE) SARS-CoV-2 target nucleic acids are NOT DETECTED. The SARS-CoV-2 RNA is generally detectable in upper and lower  respiratory specimens during the acute phase of infection. Negative results do not preclude SARS-CoV-2 infection, do not rule out co-infections with other pathogens, and should not be used as the sole basis for treatment or other patient management decisions. Negative results must be combined with clinical observations, patient history, and epidemiological information. The expected result is Negative. Fact Sheet for Patients: SugarRoll.be Fact Sheet for Healthcare Providers: https://www.woods-mathews.com/ This test is not yet approved or cleared by the [place] FDA and  has been authorized for detection and/or diagnosis of SARS-CoV-2 by FDA under an Emergency Use Authorization (EUA). This EUA will remain  in effect (meaning this test can be used) for the duration of the COVID-19 declaration under Section 56 4(b)(1) of the Act, 21 U.S.C. section 360bbb-3(b)(1), unless the authorization is terminated or revoked sooner. Performed at [facility], [place] [name], [place], [place] [address]   MRSA PCR Screening     Status: None   Collection  Time: 05/22/19  7:00 AM   Specimen: Nasal Mucosa; Nasopharyngeal  Result Value Ref Range Status   MRSA by PCR NEGATIVE NEGATIVE Final    Comment:        The GeneXpert MRSA Assay (FDA approved for NASAL specimens only), is one component of a comprehensive MRSA colonization surveillance program. It is not intended to diagnose MRSA infection nor to guide or monitor treatment for MRSA infections. Performed at [facility], [address] [place], [place] [address]     Coagulation Studies: Recent Labs    05/21/19 1901 05/24/19 0442  LABPROT 13.4 14.2  INR 1.0 1.1    Urinalysis: No results for input(s): COLORURINE, LABSPEC, PHURINE, GLUCOSEU, HGBUR, BILIRUBINUR, KETONESUR, PROTEINUR, UROBILINOGEN, NITRITE, LEUKOCYTESUR in the last 72 hours.  Invalid input(s): APPERANCEUR    Imaging: ECHOCARDIOGRAM COMPLETE  Result Date: 05/23/2019    ECHOCARDIOGRAM REPORT   Patient Name:   Jeffrey Maynard Date of Exam: 05/22/2019 Medical Rec #:  [id number]          Height:       68.0 in Accession #:    [id number]         Weight:       179.9 lb Date of Birth:  21-Jun-1985         BSA:          1.954 m Patient Age:    34 years           BP:           185/127 mmHg Patient Gender: M                  HR:           98 bpm. Exam Location:  [facility] Procedure: 2D Echo, Cardiac Doppler and Color Doppler Indications:     R94.31 Abnormal EKG  History:         Patient has no prior history of Echocardiogram examinations.                  Risk Factors:Hypertension.  Sonographer:     Wilford Sports Rodgers-Jones Referring Phys:  Roberts Diagnosing Phys: Nelva Bush MD IMPRESSIONS  1. Left ventricular ejection fraction, by estimation, is 55 to 60%. The left ventricle has normal function. The left ventricle has no regional wall motion abnormalities. There is moderate left ventricular hypertrophy. Left ventricular diastolic parameters are consistent with Grade II diastolic dysfunction (pseudonormalization).   2. Right ventricular systolic function is normal. The right ventricular size  is normal.  3. Left atrial size was mildly dilated.  4. The mitral valve is normal in structure. Mild mitral valve regurgitation. No evidence of mitral stenosis.  5. The aortic valve is tricuspid. Aortic valve regurgitation is not visualized. No aortic stenosis is present.  6. The inferior vena cava is normal in size with greater than 50% respiratory variability, suggesting right atrial pressure of 3 mmHg. FINDINGS  Left Ventricle: Left ventricular ejection fraction, by estimation, is 55 to 60%. The left ventricle has normal function. The left ventricle has no regional wall motion abnormalities. The left ventricular internal cavity size was normal in size. There is  moderate left ventricular hypertrophy. Left ventricular diastolic parameters are consistent with Grade II diastolic dysfunction (pseudonormalization). Right Ventricle: The right ventricular size is normal. No increase in right ventricular wall thickness. Right ventricular systolic function is normal. Left Atrium: Left atrial size was mildly dilated. Right Atrium: Right atrial size was normal in size. Pericardium: There is no evidence of pericardial effusion. Mitral Valve: The mitral valve is normal in structure. Mild mitral valve regurgitation. No evidence of mitral valve stenosis. Tricuspid Valve: The tricuspid valve is normal in structure. Tricuspid valve regurgitation is trivial. Aortic Valve: The aortic valve is tricuspid. Aortic valve regurgitation is not visualized. No aortic stenosis is present. Pulmonic Valve: The pulmonic valve was normal in structure. Pulmonic valve regurgitation is not visualized. No evidence of pulmonic stenosis. Aorta: The aortic root is normal in size and structure. Pulmonary Artery: The pulmonary artery is of normal size. Venous: The inferior vena cava is normal in size with greater than 50% respiratory variability, suggesting right atrial  pressure of 3 mmHg. IAS/Shunts: The interatrial septum was not well visualized.  LEFT VENTRICLE PLAX 2D LVIDd:         5.09 cm  Diastology LVIDs:         3.55 cm  LV e' lateral:   5.44 cm/s LV PW:         1.48 cm  LV E/e' lateral: 15.4 LV IVS:        1.29 cm  LV e' medial:    7.29 cm/s LVOT diam:     2.20 cm  LV E/e' medial:  11.5 LV SV:         61 LV SV Index:   31 LVOT Area:     3.80 cm  RIGHT VENTRICLE RV Basal diam:  3.62 cm RV S prime:     17.00 cm/s TAPSE (M-mode): 2.0 cm LEFT ATRIUM             Index       RIGHT ATRIUM           Index LA diam:        4.60 cm 2.35 cm/m  RA Area:     13.60 cm LA Vol (A2C):   55.1 ml 28.20 ml/m RA Volume:   39.10 ml  20.01 ml/m LA Vol (A4C):   59.6 ml 30.51 ml/m LA Biplane Vol: 58.3 ml 29.84 ml/m  AORTIC VALVE LVOT Vmax:   101.40 cm/s LVOT Vmean:  71.950 cm/s LVOT VTI:    0.162 m  AORTA Ao Root diam: 3.50 cm MITRAL VALVE MV Area (PHT): 4.31 cm    SHUNTS MV Decel Time: 176 msec    Systemic VTI:  0.16 m MV E velocity: 83.60 cm/s  Systemic Diam: 2.20 cm MV A velocity: 53.70 cm/s MV E/A ratio:  1.56 Harrell Gave End MD Electronically signed by Nelva Bush MD  Signature Date/Time: 05/23/2019/7:02:59 AM    Final      Medications:   . sodium chloride Stopped (05/23/19 0447)  . sodium chloride 10 mL/hr at 05/24/19 1002   . amLODipine  10 mg Oral Daily  . carvedilol  25 mg Oral BID WC  . Chlorhexidine Gluconate Cloth  6 each Topical Daily  . hydrALAZINE  50 mg Oral TID  . isosorbide mononitrate  60 mg Oral Daily  . sodium chloride flush  3 mL Intravenous Q12H   sodium chloride, acetaminophen **OR** acetaminophen, hydrALAZINE, ondansetron **OR** ondansetron (ZOFRAN) IV  Assessment/ Plan:  Jeffrey Maynard is a 34 y.o. black male with no significant past medical history, who was admitted to [facility] on 05/21/2019 for    1. Acute renal failure with proteinuria: baseline creatinine of 1.17, GFR was normal on 07/31/2014. Most likely progression to end stage renal  disease.  Nephrotic range proteinuria.  Renal ultrasound with diffuse echogenicity consistent with chronic kidney disease.  Unfortunately not a good candidate for renal biopsy.  No signs of renal recovery. Patient wants to go ahead and start dialysis.  - PD catheter placed today.  - Patient will do an urgent start dialysis as an outpatient at [facility] in [place].   2. Hematuria: with nephrolithiasis on ultrasound. Hematuria was present in 2016.    3. Hypertensive emergency: placed on nicardipine gtt on admission.  Started on hydralazine, amlodipine and carvedilol Blood pressure is at goal now.  - Discontinue hydralazine and isosorbide. Keep amlodipine and carvedilol - Eventually will need to be started on an ARB  4. Hypokalemia - potassium replaced.   5. Anemia with chronic kidney disease. Hemogloibn 9, normocytic. Iron studies and folic acid at goal.  - Will initiate EPO as an outpatient.   6. Secondary Hyperparathyroidism: PTH 159. Phosphorus and calcium at goal.   7. Thrombocytopenia: ADAMSTS13 negative on 4/6.    LOS: 3 Tanique Matney 4/8/20216:38 PM

## 2019-05-24 NOTE — Consult Note (Signed)
[name] Consult Note  MRN : [id number]  Jeffrey Maynard is a 34 y.o. (1985-03-12) male who presents with chief complaint of  Chief Complaint  Patient presents with  . Hypertension   History of Present Illness:  The patient is a 34 year old male with multiple medical issues (see below) who initially presented to [facility] emergency department for evaluation of "high blood pressure".  Patient presented with progressively worsening headache for about 2 weeks.  He was seen at an urgent care and noted to be hypertensive and was sent to the emergency department for treatment.  Prior labs available showed creatinine 1.17 with GFR >60 from June 2016. Creatinine now 5.23 with GFR 16 at time of admission.  Nephrology was consulted.  Patient found to be in acute renal failure with proteinuria.  During the patient's inpatient stay, unfortunately he has not shown any signs of renal recovery and nephrology would like to initiate dialysis.  At this time, patient does not have an adequate dialysis access.  After discussion with nephrology would like to proceed with peritoneal dialysis.  Patient is without complaint.  Denies any shortness of breath or chest pain.  Denies any fever, nausea vomiting.  Headache is resolved.  Vascular surgery was consulted for placement of a peritoneal dialysis catheter.  Current Facility-Administered Medications  Medication Dose Route Frequency Provider Last Rate Last Admin  . [MAR Hold] 0.9 %  sodium chloride infusion   Intravenous PRN Fritzi Mandes, MD   Stopped at 05/23/19 0447  . [MAR Hold] acetaminophen (TYLENOL) tablet 650 mg  650 mg Oral Q6H PRN Lenore Cordia, MD       Or  . Doug Sou Hold] acetaminophen (TYLENOL) suppository 650 mg  650 mg Rectal Q6H PRN Lenore Cordia, MD      . Doug Sou Hold] amLODipine (NORVASC) tablet 10 mg  10 mg Oral Daily Kolluru, Sarath, MD   10 mg at 05/24/19 0843  . [MAR Hold]  carvedilol (COREG) tablet 25 mg  25 mg Oral BID WC Kolluru, Sarath, MD   25 mg at 05/24/19 0842  . [MAR Hold] Chlorhexidine Gluconate Cloth 2 % PADS 6 each  6 each Topical Daily Fritzi Mandes, MD   6 each at 05/23/19 [phone]  . [MAR Hold] hydrALAZINE (APRESOLINE) injection 10 mg  10 mg Intravenous Q6H PRN Fritzi Mandes, MD   10 mg at 05/22/19 2124  . [MAR Hold] hydrALAZINE (APRESOLINE) tablet 50 mg  50 mg Oral TID Fritzi Mandes, MD   50 mg at 05/24/19 0842  . [MAR Hold] isosorbide mononitrate (IMDUR) 24 hr tablet 60 mg  60 mg Oral Daily Kolluru, Sarath, MD   60 mg at 05/24/19 0841  . [MAR Hold] ondansetron (ZOFRAN) tablet 4 mg  4 mg Oral Q6H PRN Lenore Cordia, MD       Or  . Doug Sou Hold] ondansetron [facility]) injection 4 mg  4 mg Intravenous Q6H PRN Lenore Cordia, MD   4 mg at 05/23/19 2253  . [MAR Hold] sodium chloride flush (NS) 0.9 % injection 3 mL  3 mL Intravenous Q12H Lenore Cordia, MD   3 mL at 05/23/19 2117   Past Medical History:  Diagnosis Date  . Hypertension    History reviewed. No pertinent surgical history.  Social History Social History   Tobacco Use  . Smoking status: Never Smoker  . Smokeless tobacco: Never Used  Substance Use Topics  . Alcohol use: No  . Drug  use: Never   Family History Family History  Problem Relation Age of Onset  . COPD Mother   Denies family history of peripheral artery disease, venous disease or renal disease.  No Known Allergies  REVIEW OF SYSTEMS (Negative unless checked)  Constitutional: [] Weight loss  [] Fever  [] Chills Cardiac: [] Chest pain   [] Chest pressure   [] Palpitations   [] Shortness of breath when laying flat   [] Shortness of breath at rest   [] Shortness of breath with exertion. Vascular:  [] Pain in legs with walking   [] Pain in legs at rest   [] Pain in legs when laying flat   [] Claudication   [] Pain in feet when walking  [] Pain in feet at rest  [] Pain in feet when laying flat   [] History of DVT   [] Phlebitis   [x] Swelling in legs    [] Varicose veins   [] Non-healing ulcers Pulmonary:   [] Uses home oxygen   [] Productive cough   [] Hemoptysis   [] Wheeze  [] COPD   [] Asthma Neurologic:  [] Dizziness  [] Blackouts   [] Seizures   [] History of stroke   [] History of TIA  [] Aphasia   [] Temporary blindness   [] Dysphagia   [] Weakness or numbness in arms   [] Weakness or numbness in legs Musculoskeletal:  [] Arthritis   [] Joint swelling   [] Joint pain   [] Low back pain Hematologic:  [] Easy bruising  [] Easy bleeding   [] Hypercoagulable state   [] Anemic  [] Hepatitis Gastrointestinal:  [] Blood in stool   [] Vomiting blood  [] Gastroesophageal reflux/heartburn   [] Difficulty swallowing. Genitourinary:  [x] Chronic kidney disease   [] Difficult urination  [] Frequent urination  [] Burning with urination   [] Blood in urine Skin:  [] Rashes   [] Ulcers   [] Wounds Psychological:  [] History of anxiety   []  History of major depression.  Physical Examination  Vitals:   05/23/19 2112 05/23/19 2346 05/24/19 0736 05/24/19 0941  BP: 140/73 (!) 110/52 (!) 167/87 (!) 143/85  Pulse: 63 66  85  Resp:  14 17 16   Temp:  98 F (36.7 C) 98.5 F (36.9 C) 98.9 F (37.2 C)  TempSrc:  Oral Oral Temporal  SpO2:  100% 98% 100%  Weight:      Height:       Body mass index is 27.62 kg/m. Gen:  WD/WN, NAD Head: [place]/AT, No temporalis wasting. Prominent temp pulse not noted. Ear/Nose/Throat: Hearing grossly intact, nares w/o erythema or drainage, oropharynx w/o Erythema/Exudate Eyes: Sclera non-icteric, conjunctiva clear Neck: Trachea midline.  No JVD.  Pulmonary:  Good air movement, respirations not labored, equal bilaterally.  Cardiac: RRR, normal S1, S2. Vascular:  Vessel Right Left  Radial Palpable Palpable  Ulnar Palpable Palpable  Brachial Palpable Palpable  Carotid Palpable, without bruit Palpable, without bruit  Aorta Not palpable N/A  Femoral Palpable Palpable  Popliteal Palpable Palpable  PT Palpable Palpable  DP Palpable Palpable    Gastrointestinal: soft, non-tender/non-distended. No guarding/reflex.  Musculoskeletal: M/S 5/5 throughout.  Extremities without ischemic changes.  No deformity or atrophy. No edema. Neurologic: Sensation grossly intact in extremities.  Symmetrical.  Speech is fluent. Motor exam as listed above. Psychiatric: Judgment intact, Mood & affect appropriate for pt's clinical situation. Dermatologic: No rashes or ulcers noted.  No cellulitis or open wounds. Lymph : No Cervical, Axillary, or Inguinal lymphadenopathy.  CBC Lab Results  Component Value Date   WBC 8.0 05/24/2019   HGB 9.0 (L) 05/24/2019   HCT 26.4 (L) 05/24/2019   MCV 89.8 05/24/2019   PLT 121 (L) 05/24/2019   BMET  Component Value Date/Time   NA 135 05/24/2019 0442   K 3.1 (L) 05/24/2019 0442   CL 101 05/24/2019 0442   CO2 24 05/24/2019 0442   GLUCOSE 103 (H) 05/24/2019 0442   BUN 79 (H) 05/24/2019 0442   CREATININE 6.59 (H) 05/24/2019 0442   CALCIUM 8.8 (L) 05/24/2019 0442   GFRNONAA 10 (L) 05/24/2019 0442   GFRAA 12 (L) 05/24/2019 0442   Estimated Creatinine Clearance: 16.8 mL/min (A) (by C-G formula based on SCr of 6.59 mg/dL (H)).  COAG Lab Results  Component Value Date   INR 1.1 05/24/2019   INR 1.0 05/21/2019   Radiology DG Chest 2 View  Result Date: 05/21/2019 CLINICAL DATA:  Hypertension EXAM: CHEST - 2 VIEW COMPARISON:  None. FINDINGS: The heart size and mediastinal contours are within normal limits. Both lungs are clear. The visualized skeletal structures are unremarkable. IMPRESSION: Normal study. Electronically Signed   By: Rolm Baptise M.D.   On: 05/21/2019 17:28   CT Head Wo Contrast  Result Date: 05/21/2019 CLINICAL DATA:  Headache EXAM: CT HEAD WITHOUT CONTRAST TECHNIQUE: Contiguous axial images were obtained from the base of the skull through the vertex without intravenous contrast. COMPARISON:  None. FINDINGS: Brain: No acute intracranial abnormality. Specifically, no hemorrhage, hydrocephalus,  mass lesion, acute infarction, or significant intracranial injury. Vascular: No hyperdense vessel or unexpected calcification. Skull: No acute calvarial abnormality. Sinuses/Orbits: Visualized paranasal sinuses and mastoids clear. Orbital soft tissues unremarkable. Other: None IMPRESSION: Normal study. Electronically Signed   By: Rolm Baptise M.D.   On: 05/21/2019 17:54   US RENAL  Result Date: 05/22/2019 CLINICAL DATA:  Initial evaluation for acute renal failure, hematuria, nephrolithiasis. EXAM: RENAL / URINARY TRACT ULTRASOUND COMPLETE COMPARISON:  Prior CT from 07/31/2014. FINDINGS: Right Kidney: Renal measurements: 9.6 x 4.9 x 5.0 cm = volume: 124 mL. Diffusely increased echogenicity seen within the renal parenchyma. 6 mm nonobstructive stone present at the upper pole. No hydronephrosis. No focal renal mass. Left Kidney: Renal measurements: 9.6 x 5.1 x 4.4 cm = volume: 113 mL. Diffusely increased echogenicity seen within the renal parenchyma. 5 mm nonobstructive stone present at the interpolar region. No hydronephrosis. No focal renal mass. Bladder: Appears normal for degree of bladder distention. Other: None. IMPRESSION: 1. Increased echogenicity within the renal parenchyma, compatible with medical renal disease. 2. Bilateral nonobstructive nephrolithiasis as above. No hydronephrosis. Electronically Signed   By: Jeannine Boga M.D.   On: 05/22/2019 00:55   ECHOCARDIOGRAM COMPLETE  Result Date: 05/23/2019    ECHOCARDIOGRAM REPORT   Patient Name:   Jeffrey Maynard Date of Exam: 05/22/2019 Medical Rec #:  [id number]          Height:       68.0 in Accession #:    [id number]         Weight:       179.9 lb Date of Birth:  12-Feb-1986         BSA:          1.954 m Patient Age:    33 years           BP:           185/127 mmHg Patient Gender: M                  HR:           98 bpm. Exam Location:  [facility] Procedure: 2D Echo, Cardiac Doppler and Color Doppler Indications:     R94.31 Abnormal EKG  History:          Patient has no prior history of Echocardiogram examinations.                  Risk Factors:Hypertension.  Sonographer:     Wilford Sports Rodgers-Jones Referring Phys:  [place] Diagnosing Phys: Nelva Bush MD IMPRESSIONS  1. Left ventricular ejection fraction, by estimation, is 55 to 60%. The left ventricle has normal function. The left ventricle has no regional wall motion abnormalities. There is moderate left ventricular hypertrophy. Left ventricular diastolic parameters are consistent with Grade II diastolic dysfunction (pseudonormalization).  2. Right ventricular systolic function is normal. The right ventricular size is normal.  3. Left atrial size was mildly dilated.  4. The mitral valve is normal in structure. Mild mitral valve regurgitation. No evidence of mitral stenosis.  5. The aortic valve is tricuspid. Aortic valve regurgitation is not visualized. No aortic stenosis is present.  6. The inferior vena cava is normal in size with greater than 50% respiratory variability, suggesting right atrial pressure of 3 mmHg. FINDINGS  Left Ventricle: Left ventricular ejection fraction, by estimation, is 55 to 60%. The left ventricle has normal function. The left ventricle has no regional wall motion abnormalities. The left ventricular internal cavity size was normal in size. There is  moderate left ventricular hypertrophy. Left ventricular diastolic parameters are consistent with Grade II diastolic dysfunction (pseudonormalization). Right Ventricle: The right ventricular size is normal. No increase in right ventricular wall thickness. Right ventricular systolic function is normal. Left Atrium: Left atrial size was mildly dilated. Right Atrium: Right atrial size was normal in size. Pericardium: There is no evidence of pericardial effusion. Mitral Valve: The mitral valve is normal in structure. Mild mitral valve regurgitation. No evidence of mitral valve stenosis. Tricuspid Valve: The tricuspid valve is normal  in structure. Tricuspid valve regurgitation is trivial. Aortic Valve: The aortic valve is tricuspid. Aortic valve regurgitation is not visualized. No aortic stenosis is present. Pulmonic Valve: The pulmonic valve was normal in structure. Pulmonic valve regurgitation is not visualized. No evidence of pulmonic stenosis. Aorta: The aortic root is normal in size and structure. Pulmonary Artery: The pulmonary artery is of normal size. Venous: The inferior vena cava is normal in size with greater than 50% respiratory variability, suggesting right atrial pressure of 3 mmHg. IAS/Shunts: The interatrial septum was not well visualized.  LEFT VENTRICLE PLAX 2D LVIDd:         5.09 cm  Diastology LVIDs:         3.55 cm  LV e' lateral:   5.44 cm/s LV PW:         1.48 cm  LV E/e' lateral: 15.4 LV IVS:        1.29 cm  LV e' medial:    7.29 cm/s LVOT diam:     2.20 cm  LV E/e' medial:  11.5 LV SV:         61 LV SV Index:   31 LVOT Area:     3.80 cm  RIGHT VENTRICLE RV Basal diam:  3.62 cm RV S prime:     17.00 cm/s TAPSE (M-mode): 2.0 cm LEFT ATRIUM             Index       RIGHT ATRIUM           Index LA diam:        4.60 cm 2.35 cm/m  RA Area:     13.60 cm LA  Vol (A2C):   55.1 ml 28.20 ml/m RA Volume:   39.10 ml  20.01 ml/m LA Vol (A4C):   59.6 ml 30.51 ml/m LA Biplane Vol: 58.3 ml 29.84 ml/m  AORTIC VALVE LVOT Vmax:   101.40 cm/s LVOT Vmean:  71.950 cm/s LVOT VTI:    0.162 m  AORTA Ao Root diam: 3.50 cm MITRAL VALVE MV Area (PHT): 4.31 cm    SHUNTS MV Decel Time: 176 msec    Systemic VTI:  0.16 m MV E velocity: 83.60 cm/s  Systemic Diam: 2.20 cm MV A velocity: 53.70 cm/s MV E/A ratio:  1.56 Harrell Gave End MD Electronically signed by Nelva Bush MD Signature Date/Time: 05/23/2019/7:02:59 AM    Final    Assessment/Plan The patient is a 34 year old male with no significant past medical history who presented to the [facility] emergency department with progressively worsening headache x2 weeks.   During his inpatient stay, the patient was found to be in acute renal failure without any signs of recovery.  The patient and nephrology would like to initiate dialysis at this time and the patient has chosen to move forward with peritoneal dialysis.  1.  Acute renal failure: Patient has not shown any signs of renal recovery.  Nephrology and the patient would like to initiate dialysis at this time.  The patient does not have an adequate dialysis access and is chosen to move forward with peritoneal dialysis.  We will place a peritoneal dialysis catheter today so the patient may initiate dialysis.  Procedure, risks and benefits were explained to the patient.  All questions were answered.  The patient wished to proceed.  2.  Hypertensive crisis: Initial reason patient sent to emergency department. This now seems to be under control.  3.  Anemia of chronic disease: Asymptomatic at this time. Patient is being followed by nephrology and the hospitalist team.  Discussed with Dr. Mayme Genta, PA-C  05/24/2019 9:44 AM  This note was created with Dragon medical transcription system.  Any error is purely unintentional

## 2019-05-24 NOTE — Anesthesia Procedure Notes (Signed)
Procedure Name: Intubation Date/Time: 05/24/2019 10:20 AM Performed by: Willette Alma, CRNA Pre-anesthesia Checklist: Patient identified, Patient being monitored, Timeout performed, Emergency Drugs available and Suction available Patient Re-evaluated:Patient Re-evaluated prior to induction Oxygen Delivery Method: Circle system utilized Preoxygenation: Pre-oxygenation with 100% oxygen Induction Type: IV induction Ventilation: Mask ventilation without difficulty Laryngoscope Size: Mac and 4 Grade View: Grade I Tube type: Oral Tube size: 7.0 mm Number of attempts: 1 Airway Equipment and Method: Stylet Placement Confirmation: ETT inserted through vocal cords under direct vision,  positive ETCO2 and breath sounds checked- equal and bilateral Secured at: 21 cm Tube secured with: Tape Dental Injury: Teeth and Oropharynx as per pre-operative assessment  Comments: 1st attempt with 7.5 ETT, resistance met. Elected to use 7.0 ETT, which easily passed. Grade 1 view each attempt. Atraumatic intubation. Lips and teeth as before.

## 2019-05-25 LAB — MAGNESIUM: Magnesium: 2.3 mg/dL (ref 1.7–2.4)

## 2019-05-25 LAB — POTASSIUM: Potassium: 3.5 mmol/L (ref 3.5–5.1)

## 2019-05-25 MED ORDER — CARVEDILOL 25 MG PO TABS: 25.0000 mg | ORAL_TABLET | Freq: Two times a day (BID) | ORAL | 1 refills | Status: AC

## 2019-05-25 MED ORDER — LORAZEPAM 0.5 MG PO TABS: 0.5000 mg | ORAL_TABLET | Freq: Every day | ORAL | 0 refills | Status: DC

## 2019-05-25 MED ORDER — HYDRALAZINE HCL 50 MG PO TABS: 50.0000 mg | ORAL_TABLET | Freq: Three times a day (TID) | ORAL | 1 refills | Status: DC

## 2019-05-25 MED ORDER — AMLODIPINE BESYLATE 10 MG PO TABS: 10.0000 mg | ORAL_TABLET | Freq: Every day | ORAL | 1 refills | Status: AC

## 2019-05-25 MED ORDER — TRAMADOL HCL 50 MG PO TABS: 50.0000 mg | ORAL_TABLET | Freq: Two times a day (BID) | ORAL | 0 refills | Status: DC | PRN

## 2019-05-25 MED ORDER — LORAZEPAM 0.5 MG PO TABS: 0.5000 mg | ORAL_TABLET | Freq: Every day | ORAL | Status: DC

## 2019-05-25 MED ORDER — ISOSORBIDE MONONITRATE ER 60 MG PO TB24: 60.0000 mg | ORAL_TABLET | Freq: Every day | ORAL | 1 refills | Status: DC

## 2019-05-25 MED ORDER — POTASSIUM CHLORIDE CRYS ER 20 MEQ PO TBCR: 40.0000 meq | EXTENDED_RELEASE_TABLET | Freq: Once | ORAL | Status: DC

## 2019-05-25 NOTE — Progress Notes (Signed)
[facility] Daily Progress Note   Subjective: 05/24/19: 1. Laparoscopic peritoneal dialysis catheter placement.  Patient without complaint. No issues overnight.   Objective: Vitals:   05/24/19 2028 05/25/19 0152 05/25/19 0500 05/25/19 0745  BP: (!) 120/54 117/61  122/68  Pulse: 78 78  76  Resp:    16  Temp:  98.2 F (36.8 C)  98.2 F (36.8 C)  TempSrc:    Oral  SpO2:  96%  99%  Weight:   87.6 kg   Height:        Intake/Output Summary (Last 24 hours) at 05/25/2019 1042 Last data filed at 05/24/2019 2000 Gross per 24 hour  Intake 100 ml  Output 150 ml  Net -50 ml   Physical Exam: A&Ox3, NAD CV: RRR Pulmonary: CTA Bilaterally Abdomen: Soft, Nontender, Nondistended  PD: Intact, no drainage or swelling noted.  Vascular:  Bilateral Extremities: warm distally to toes, no swelling.   Laboratory: CBC    Component Value Date/Time   WBC 8.0 05/24/2019 0442   HGB 9.0 (L) 05/24/2019 0442   HCT 26.4 (L) 05/24/2019 0442   PLT 121 (L) 05/24/2019 0442   BMET    Component Value Date/Time   NA 135 05/24/2019 0442   K 3.5 05/25/2019 0816   CL 101 05/24/2019 0442   CO2 24 05/24/2019 0442   GLUCOSE 103 (H) 05/24/2019 0442   BUN 79 (H) 05/24/2019 0442   CREATININE 6.59 (H) 05/24/2019 0442   CALCIUM 8.8 (L) 05/24/2019 0442   GFRNONAA 10 (L) 05/24/2019 0442   GFRAA 12 (L) 05/24/2019 0442   Assessment/Planning: The patient is a 34 year old male s/p peritoneal dialysis catheter insertion - POD#1 1) Patient doing well post-op. 2) Tolerating diet. 3) Can be discharged home from vascular standpoint  Discussed with Dr. Ellis Parents [facility] PA-C 05/25/2019 10:42 AM

## 2019-05-25 NOTE — Progress Notes (Signed)
[facility]  ROUNDING NOTE   Subjective:   Wife at bedside. Questions and concerns about end stage renal disease addressed.   Objective:  Vital signs in last 24 hours:  Temp:  [98.2 F (36.8 C)] 98.2 F (36.8 C) (04/09 0745) Pulse Rate:  [76-83] 76 (04/09 0745) Resp:  [16] 16 (04/09 0745) BP: (117-138)/(54-68) 122/68 (04/09 0745) SpO2:  [96 %-99 %] 99 % (04/09 0745) Weight:  [87.6 kg] 87.6 kg (04/09 0500)  Weight change:  Filed Weights   05/22/19 0940 05/23/19 0521 05/25/19 0500  Weight: 81.6 kg 82.4 kg 87.6 kg    Intake/Output: I/O last 3 completed shifts: In: 350 [I.V.:350] Out: 150 [Urine:150]   Intake/Output this shift:  No intake/output data recorded.  Physical Exam: General: NAD, laying in bed  Head: Normocephalic, atraumatic. Moist oral mucosal membranes  Eyes: Anicteric, PERRL  Neck: Supple, trachea midline  Lungs:  Clear to auscultation  Heart: Regular rate and rhythm  Abdomen:  Soft, nontender,   Extremities:  no peripheral edema.  Neurologic: Nonfocal, moving all four extremities  Skin: No lesions  Access +PDC    Basic Metabolic Panel: Recent Labs  Lab 05/21/19 1650 05/21/19 1650 05/21/19 1850 05/21/19 1901 05/22/19 0450 05/22/19 2049 05/23/19 0432 05/24/19 0442 05/25/19 0816  NA 134*  --   --   --  135  --  136 135  --   K 2.6*   < >  --   --  2.6* 3.1* 3.5 3.1* 3.5  CL 96*  --   --   --  99  --  101 101  --   CO2 25  --   --   --  24  --  25 24  --   GLUCOSE 109*  --   --   --  124*  --  110* 103*  --   BUN 52*  --   --   --  52*  --  59* 79*  --   CREATININE 5.23*  --   --   --  5.51*  --  5.85* 6.59*  --   CALCIUM 8.9   < >  --   --  8.5*  --  8.9 8.8*  --   MG  --   --  2.1  --  2.0  --   --  2.1 2.3  PHOS  --   --   --  4.4 4.7*  --   --   --   --    < > = values in this interval not displayed.    Liver Function Tests: Recent Labs  Lab 05/22/19 0450  ALBUMIN 3.8   No results for input(s): LIPASE, AMYLASE in  the last 168 hours. No results for input(s): AMMONIA in the last 168 hours.  CBC: Recent Labs  Lab 05/21/19 1650 05/22/19 0450 05/24/19 0442  WBC 10.3 10.1 8.0  HGB 10.8* 10.2* 9.0*  HCT 30.5* 27.4* 26.4*  MCV 86.6 84.0 89.8  PLT 68* 80* 121*    Cardiac Enzymes: Recent Labs  Lab 05/21/19 1830  CKTOTAL 540*    BNP: Invalid input(s): POCBNP  CBG: Recent Labs  Lab 05/22/19 0932  GLUCAP 125*    Microbiology: Results for orders placed or performed during the hospital encounter of 05/21/19  SARS CORONAVIRUS 2 (TAT 6-24 HRS) Nasopharyngeal Nasopharyngeal Swab     Status: None   Collection Time: 05/21/19  6:00 PM   Specimen: Nasopharyngeal Swab  Result Value Ref Range  Status   SARS Coronavirus 2 NEGATIVE NEGATIVE Final    Comment: (NOTE) SARS-CoV-2 target nucleic acids are NOT DETECTED. The SARS-CoV-2 RNA is generally detectable in upper and lower respiratory specimens during the acute phase of infection. Negative results do not preclude SARS-CoV-2 infection, do not rule out co-infections with other pathogens, and should not be used as the sole basis for treatment or other patient management decisions. Negative results must be combined with clinical observations, patient history, and epidemiological information. The expected result is Negative. Fact Sheet for Patients: SugarRoll.be Fact Sheet for Healthcare Providers: https://www.woods-mathews.com/ This test is not yet approved or cleared by the [place] FDA and  has been authorized for detection and/or diagnosis of SARS-CoV-2 by FDA under an Emergency Use Authorization (EUA). This EUA will remain  in effect (meaning this test can be used) for the duration of the COVID-19 declaration under Section 56 4(b)(1) of the Act, 21 U.S.C. section 360bbb-3(b)(1), unless the authorization is terminated or revoked sooner. Performed at [facility], [place] [address],  [place], [place] [address]   MRSA PCR Screening     Status: None   Collection Time: 05/22/19  7:00 AM   Specimen: Nasal Mucosa; Nasopharyngeal  Result Value Ref Range Status   MRSA by PCR NEGATIVE NEGATIVE Final    Comment:        The GeneXpert MRSA Assay (FDA approved for NASAL specimens only), is one component of a comprehensive MRSA colonization surveillance program. It is not intended to diagnose MRSA infection nor to guide or monitor treatment for MRSA infections. Performed at [facility], [address] [place],  [address]     Coagulation Studies: Recent Labs    05/24/19 0442  LABPROT 14.2  INR 1.1    Urinalysis: No results for input(s): COLORURINE, LABSPEC, PHURINE, GLUCOSEU, HGBUR, BILIRUBINUR, KETONESUR, PROTEINUR, UROBILINOGEN, NITRITE, LEUKOCYTESUR in the last 72 hours.  Invalid input(s): APPERANCEUR    Imaging: No results found.   Medications:   . sodium chloride Stopped (05/23/19 0447)  . sodium chloride Stopped (05/24/19 1930)   . amLODipine  10 mg Oral Daily  . carvedilol  25 mg Oral BID WC  . Chlorhexidine Gluconate Cloth  6 each Topical Daily  . hydrALAZINE  50 mg Oral TID  . LORazepam  0.5 mg Oral QHS  . sodium chloride flush  3 mL Intravenous Q12H   sodium chloride, acetaminophen **OR** acetaminophen, ondansetron **OR** ondansetron (ZOFRAN) IV, traMADol  Assessment/ Plan:  Mr. Jeffrey Maynard is a 34 y.o. black male with no significant past medical history, who was admitted to [facility] on 05/21/2019 for    1. Acute renal failure with proteinuria: baseline creatinine of 1.17, GFR was normal on 07/31/2014. Most likely progression to end stage renal disease.  Nephrotic range proteinuria.  Renal ultrasound with diffuse echogenicity consistent with chronic kidney disease.  Unfortunately not a good candidate for renal biopsy.  No signs of renal recovery. Patient wants to go ahead and start dialysis.  - PD catheter placed 4/8.  -  Patient will do an urgent start dialysis as an outpatient at [facility] in [place]. Patient to be at [facility] at [facility] Monday 4/12  2. Hematuria: with nephrolithiasis on ultrasound. Hematuria was present in 2016.    3. Hypertensive emergency: placed on nicardipine gtt on admission.  Started on hydralazine, amlodipine and carvedilol Blood pressure is at goal now.  - Discontinue hydralazine and isosorbide. Keep amlodipine and carvedilol - Eventually will need to be started  on an ARB  4. Hypokalemia - potassium replaced.   5. Anemia with chronic kidney disease. Hemogloibn 9, normocytic. Iron studies and folic acid at goal.  - Will initiate EPO as an outpatient.   6. Secondary Hyperparathyroidism: PTH 159. Phosphorus and calcium at goal.   7. Thrombocytopenia: ADAMSTS13 negative on 4/6.    LOS: 4 Jeanie Mccard 4/9/20211:32 PM

## 2019-05-25 NOTE — Consult Note (Signed)
PHARMACY CONSULT NOTE - FOLLOW UP  Pharmacy Consult for Electrolyte Monitoring and Replacement   Recent Labs: Potassium (mmol/L)  Date Value  05/25/2019 3.5   Magnesium (mg/dL)  Date Value  05/25/2019 2.3   Calcium (mg/dL)  Date Value  05/24/2019 8.8 (L)   Albumin (g/dL)  Date Value  05/22/2019 3.8   Phosphorus (mg/dL)  Date Value  05/22/2019 4.7 (H)   Sodium (mmol/L)  Date Value  05/24/2019 135     Assessment: 34 y.o.malewith medical history significant forhypertensionandkidney stonewho presents to the ED from urgent care for evaluation of severe hypertension.He has had some associated blurry vision and a couple days ago had nausea with emesis. Pt with AKI with proteinuria.   Scr: 5.23>>5.85>> 6.59   PD catheter placed 4/8 by vascular surgery.   Goal of Therapy:  WNL  Plan:  Per Nephro- most likely progression to end stage renal disease.   Electrolytes WNL this morning. No replacement needed at this time.   Pharmacy will continue to follow and replace electrolytes as needed.   Will plan to sign off 4/10 if no replacement needed.   Pernell Dupre, PharmD, BCPS Clinical Pharmacist 05/25/2019 8:58 AM

## 2019-05-25 NOTE — Discharge Summary (Addendum)
[facility] [place] NAME: Jeffrey Maynard    MR#:  [id number]  DATE OF BIRTH:  January 23, 1986  DATE OF ADMISSION:  05/21/2019 ADMITTING PHYSICIAN: Lenore Cordia, MD  DATE OF DISCHARGE: 05/25/2019  PRIMARY CARE PHYSICIAN: System, Pcp Not In    ADMISSION DIAGNOSIS:  Acute kidney failure, unspecified ([place]) [N17.9] Nephrolithiasis [N20.0] Blood in urine [R31.9] Acute renal failure (ARF) (HCC) [N17.9] Hypertensive emergency without congestive heart failure [I16.1]  DISCHARGE DIAGNOSIS:  Acute renal failure with Proteinuria--Likley progression to ESRD s/p PD cath placement Malignant HTN Anxiety/Insomnia  SECONDARY DIAGNOSIS:   Past Medical History:  Diagnosis Date  . Hypertension     HOSPITAL COURSE:  [name] a 34 y.o.malewith medical history significant forhypertensionandkidney stonewho presents to the ED from urgent care for evaluation of severe hypertension.He says over the last 2 weeks he has been having intermittent left temporal headache described as a quick sharp discomfort. He has had some associated blurry vision and a couple days ago had nausea with emesis.  Acute renal failure with most likely progression to ESRD -proteinuria/Hematuria -Only prior labs available showed creatinine 1.17 with GFR>32from June 2016. -Creatinine now 5.23 with GFR 16 at time of admission.  -Nephrology Dr Juleen China  Recommends starting peritoneal dialysis while in house. -4/8--Status post PD cath placement--PD to be started as out pt per Dr Raliegh Ip -differential seems to be FSGS -Renal ultrasound showed CKD changes with bilateral nonobstructive kidney stones  Hypertensive emergency: -was on IV nicardipine with initial goal SBP 180 in the first hour then gradual decrease to SBP 160--now weaned to off -on po amlodipine, coreg, hydralazine. -prn IV hydralazine -ARB as out pt per Dr Raliegh Ip if needed  Thrombocytopenia: -Platelets  68,000--> 80K No obvious signs of bleeding.  -Continue renal work-up as above as well as AdamsTS13 activity 77.6  -Hold pharmacologic VTE prophylaxis. -LDH >600 -pathology reviewed PS -- showed few fragmented RBCs. No evidence of TTP  Elevated troponin: Suspect demand ischemia in the setting of acute renal failure and hypertensive emergency. Nonspecific T wave changes on EKG likely from severe hypertension.  -check echo -no cp  Hypokalemia: -K 3.1--po K--3.5 today  Anxiety with significant social stressors -patient requesting to speak with psychiatrist--seen by Dr Alfonzo Feller lorazepam 0.5 mg qhs x 2 weeks   DVT prophylaxis:SCDs due to low plts Code Status:Full code, confirmed with patient Family Communication:Discussed with patient spouse at bedside today Disposition Plan:From home,discharge to home today Consults called:Nephrology, psychiatry   CONSULTS OBTAINED:  Treatment Team:  Algernon Huxley, MD Dixie Dials, MD  DRUG ALLERGIES:  No Known Allergies  DISCHARGE MEDICATIONS:   Allergies as of 05/25/2019   No Known Allergies     Medication List    STOP taking these medications   ketorolac 10 MG tablet Commonly known as: TORADOL   tamsulosin 0.4 MG Caps capsule Commonly known as: FLOMAX     TAKE these medications   amLODipine 10 MG tablet Commonly known as: NORVASC Take 1 tablet (10 mg total) by mouth daily. Start taking on: May 26, 2019   carvedilol 25 MG tablet Commonly known as: COREG Take 1 tablet (25 mg total) by mouth 2 (two) times daily with a meal.   hydrALAZINE 50 MG tablet Commonly known as: APRESOLINE Take 1 tablet (50 mg total) by mouth 3 (three) times daily.   ipratropium 0.06 % nasal spray Commonly known as: ATROVENT Place 2 sprays into both nostrils 2 (two) times daily.  LORazepam 0.5 MG tablet Commonly known as: ATIVAN Take 1 tablet (0.5 mg total) by mouth at bedtime.   montelukast 10 MG tablet Commonly  known as: SINGULAIR Take 10 mg by mouth daily.   traMADol 50 MG tablet Commonly known as: ULTRAM Take 1 tablet (50 mg total) by mouth every 12 (twelve) hours as needed for severe pain.       If you experience worsening of your admission symptoms, develop shortness of breath, life threatening emergency, suicidal or homicidal thoughts you must seek medical attention immediately by calling 911 or calling your MD immediately  if symptoms less severe.  You Must read complete instructions/literature along with all the possible adverse reactions/side effects for all the Medicines you take and that have been prescribed to you. Take any new Medicines after you have completely understood and accept all the possible adverse reactions/side effects.   Please note  You were cared for by a hospitalist during your hospital stay. If you have any questions about your discharge medications or the care you received while you were in the hospital after you are discharged, you can call the unit and asked to speak with the hospitalist on call if the hospitalist that took care of you is not available. Once you are discharged, your primary care physician will handle any further medical issues. Please note that NO REFILLS for any discharge medications will be authorized once you are discharged, as it is imperative that you return to your primary care physician (or establish a relationship with a primary care physician if you do not have one) for your aftercare needs so that they can reassess your need for medications and monitor your lab values. Today   SUBJECTIVE   No new complaints  VITAL SIGNS:  Blood pressure 122/68, pulse 76, temperature 98.2 F (36.8 C), temperature source Oral, resp. rate 16, height 5\' 8"  (1.727 m), weight 87.6 kg, SpO2 99 %.  I/O:    Intake/Output Summary (Last 24 hours) at 05/25/2019 0956 Last data filed at 05/24/2019 2000 Gross per 24 hour  Intake 350 ml  Output 150 ml  Net 200 ml     PHYSICAL EXAMINATION:  GENERAL:  34 y.o.-year-old patient lying in the bed with no acute distress.  EYES: Pupils equal, round, reactive to light and accommodation. No scleral icterus.  HEENT: Head atraumatic, normocephalic. Oropharynx and nasopharynx clear.  NECK:  Supple, no jugular venous distention. No thyroid enlargement, no tenderness.  LUNGS: Normal breath sounds bilaterally, no wheezing, rales,rhonchi or crepitation. No use of accessory muscles of respiration.  CARDIOVASCULAR: S1, S2 normal. No murmurs, rubs, or gallops.  ABDOMEN: Soft, non-tender, non-distended. Bowel sounds present. No organomegaly or mass. PD cath+ EXTREMITIES: No pedal edema, cyanosis, or clubbing.  NEUROLOGIC: Cranial nerves II through XII are intact. Muscle strength 5/5 in all extremities. Sensation intact. Gait not checked.  PSYCHIATRIC: The patient is alert and oriented x 3.  SKIN: No obvious rash, lesion, or ulcer.   DATA REVIEW:   CBC  Recent Labs  Lab 05/24/19 0442  WBC 8.0  HGB 9.0*  HCT 26.4*  PLT 121*    Chemistries  Recent Labs  Lab 05/24/19 0442 05/24/19 0442 05/25/19 0816  NA 135  --   --   K 3.1*   < > 3.5  CL 101  --   --   CO2 24  --   --   GLUCOSE 103*  --   --   BUN 79*  --   --  CREATININE 6.59*  --   --   CALCIUM 8.8*  --   --   MG 2.1   < > 2.3   < > = values in this interval not displayed.    Microbiology Results   Recent Results (from the past 240 hour(s))  SARS CORONAVIRUS 2 (TAT 6-24 HRS) Nasopharyngeal Nasopharyngeal Swab     Status: None   Collection Time: 05/21/19  6:00 PM   Specimen: Nasopharyngeal Swab  Result Value Ref Range Status   SARS Coronavirus 2 NEGATIVE NEGATIVE Final    Comment: (NOTE) SARS-CoV-2 target nucleic acids are NOT DETECTED. The SARS-CoV-2 RNA is generally detectable in upper and lower respiratory specimens during the acute phase of infection. Negative results do not preclude SARS-CoV-2 infection, do not rule out co-infections  with other pathogens, and should not be used as the sole basis for treatment or other patient management decisions. Negative results must be combined with clinical observations, patient history, and epidemiological information. The expected result is Negative. Fact Sheet for Patients: SugarRoll.be Fact Sheet for Healthcare Providers: https://www.woods-mathews.com/ This test is not yet approved or cleared by the [place] FDA and  has been authorized for detection and/or diagnosis of SARS-CoV-2 by FDA under an Emergency Use Authorization (EUA). This EUA will remain  in effect (meaning this test can be used) for the duration of the COVID-19 declaration under Section 56 4(b)(1) of the Act, 21 U.S.C. section 360bbb-3(b)(1), unless the authorization is terminated or revoked sooner. Performed at [facility], [place] [address] [place], [place] [address]   MRSA PCR Screening     Status: None   Collection Time: 05/22/19  7:00 AM   Specimen: Nasal Mucosa; Nasopharyngeal  Result Value Ref Range Status   MRSA by PCR NEGATIVE NEGATIVE Final    Comment:        The GeneXpert MRSA Assay (FDA approved for NASAL specimens only), is one component of a comprehensive MRSA colonization surveillance program. It is not intended to diagnose MRSA infection nor to guide or monitor treatment for MRSA infections. Performed at [facility], [address] [place], [place] [address]     RADIOLOGY:  No results found.   CODE STATUS:     Code Status Orders  (From admission, onward)         Start     Ordered   05/21/19 1906  Full code  Continuous     05/21/19 1906        Code Status History    This patient has a current code status but no historical code status.   Advance Care Planning Activity       TOTAL TIME TAKING CARE OF THIS PATIENT: 40  minutes.    Fritzi Mandes M.D  [facility]    CC: Primary care physician;  System, Pcp Not In

## 2019-05-25 NOTE — Plan of Care (Signed)
Nutrition Education Note RD working remotely.  Spoke with patient's wife over the phone. Patient with new ESRD beginning PD. Patient's wife had a few questions regarding diet and which types of protein and fruit patient can have. Patient is discharging today.  RD consulted for Renal Education. Reviewed Chronic Kidney Disease Medical Nutrition Therapy handout from the [employer] with patient/family over the phone. Will mail handout to patient. Reviewed food groups and provided written recommended serving sizes specifically determined for patient's current nutritional status.   Explained why diet restrictions are needed and provided lists of foods to limit/avoid that are high potassium, sodium, and phosphorus. Provided specific recommendations on safer alternatives of these foods. Strongly encouraged compliance of this diet.   Discussed importance of protein intake at each meal and snack. Provided examples of how to maximize protein intake throughout the day. Discussed need for fluid restriction with dialysis, importance of minimizing weight gain between HD treatments, and renal-friendly beverage options.  Encouraged pt to discuss specific diet questions/concerns with RD at HD outpatient facility. Teach back method used.  Expect good compliance.  Body mass index is 29.36 kg/m. Pt meets criteria for overweight based on current BMI.  Current diet order is renal, patient is consuming approximately 95-100% of meals at this time. Labs and medications reviewed. No further nutrition interventions warranted at this time. RD contact information provided. If additional nutrition issues arise, please re-consult RD.  Jacklynn Barnacle, MS, RD, LDN Pager number available on Amion

## 2019-05-25 NOTE — Discharge Instructions (Signed)

## 2019-05-28 DIAGNOSIS — Z992 Dependence on renal dialysis: Secondary | ICD-10-CM | POA: Diagnosis not present

## 2019-05-28 DIAGNOSIS — E878 Other disorders of electrolyte and fluid balance, not elsewhere classified: Secondary | ICD-10-CM | POA: Diagnosis not present

## 2019-05-28 DIAGNOSIS — D509 Iron deficiency anemia, unspecified: Secondary | ICD-10-CM | POA: Diagnosis not present

## 2019-05-28 DIAGNOSIS — N186 End stage renal disease: Secondary | ICD-10-CM | POA: Diagnosis not present

## 2019-05-28 DIAGNOSIS — Z13220 Encounter for screening for lipoid disorders: Secondary | ICD-10-CM | POA: Diagnosis not present

## 2019-05-30 DIAGNOSIS — N186 End stage renal disease: Secondary | ICD-10-CM | POA: Diagnosis not present

## 2019-05-30 DIAGNOSIS — Z992 Dependence on renal dialysis: Secondary | ICD-10-CM | POA: Diagnosis not present

## 2019-06-01 DIAGNOSIS — N186 End stage renal disease: Secondary | ICD-10-CM | POA: Diagnosis not present

## 2019-06-01 DIAGNOSIS — Z992 Dependence on renal dialysis: Secondary | ICD-10-CM | POA: Diagnosis not present

## 2019-06-04 DIAGNOSIS — N186 End stage renal disease: Secondary | ICD-10-CM | POA: Diagnosis not present

## 2019-06-04 DIAGNOSIS — Z992 Dependence on renal dialysis: Secondary | ICD-10-CM | POA: Diagnosis not present

## 2019-06-06 DIAGNOSIS — Z992 Dependence on renal dialysis: Secondary | ICD-10-CM | POA: Diagnosis not present

## 2019-06-06 DIAGNOSIS — N186 End stage renal disease: Secondary | ICD-10-CM | POA: Diagnosis not present

## 2019-06-08 DIAGNOSIS — N186 End stage renal disease: Secondary | ICD-10-CM | POA: Diagnosis not present

## 2019-06-08 DIAGNOSIS — Z992 Dependence on renal dialysis: Secondary | ICD-10-CM | POA: Diagnosis not present

## 2019-06-11 DIAGNOSIS — N186 End stage renal disease: Secondary | ICD-10-CM | POA: Diagnosis not present

## 2019-06-11 DIAGNOSIS — Z992 Dependence on renal dialysis: Secondary | ICD-10-CM | POA: Diagnosis not present

## 2019-06-12 DIAGNOSIS — Z992 Dependence on renal dialysis: Secondary | ICD-10-CM | POA: Diagnosis not present

## 2019-06-12 DIAGNOSIS — N186 End stage renal disease: Secondary | ICD-10-CM | POA: Diagnosis not present

## 2019-06-14 DIAGNOSIS — Z992 Dependence on renal dialysis: Secondary | ICD-10-CM | POA: Diagnosis not present

## 2019-06-14 DIAGNOSIS — N186 End stage renal disease: Secondary | ICD-10-CM | POA: Diagnosis not present

## 2019-06-15 DIAGNOSIS — H6980 Other specified disorders of Eustachian tube, unspecified ear: Secondary | ICD-10-CM | POA: Diagnosis not present

## 2019-06-15 DIAGNOSIS — Z992 Dependence on renal dialysis: Secondary | ICD-10-CM | POA: Diagnosis not present

## 2019-06-15 DIAGNOSIS — N186 End stage renal disease: Secondary | ICD-10-CM | POA: Diagnosis not present

## 2019-06-15 DIAGNOSIS — H6500 Acute serous otitis media, unspecified ear: Secondary | ICD-10-CM | POA: Diagnosis not present

## 2019-06-19 DIAGNOSIS — N186 End stage renal disease: Secondary | ICD-10-CM | POA: Diagnosis not present

## 2019-06-19 DIAGNOSIS — Z992 Dependence on renal dialysis: Secondary | ICD-10-CM | POA: Diagnosis not present

## 2019-06-20 DIAGNOSIS — N186 End stage renal disease: Secondary | ICD-10-CM | POA: Diagnosis not present

## 2019-06-20 DIAGNOSIS — Z992 Dependence on renal dialysis: Secondary | ICD-10-CM | POA: Diagnosis not present

## 2019-06-21 DIAGNOSIS — Z992 Dependence on renal dialysis: Secondary | ICD-10-CM | POA: Diagnosis not present

## 2019-06-21 DIAGNOSIS — N186 End stage renal disease: Secondary | ICD-10-CM | POA: Diagnosis not present

## 2019-06-22 DIAGNOSIS — N186 End stage renal disease: Secondary | ICD-10-CM | POA: Diagnosis not present

## 2019-06-22 DIAGNOSIS — Z992 Dependence on renal dialysis: Secondary | ICD-10-CM | POA: Diagnosis not present

## 2019-06-23 DIAGNOSIS — Z992 Dependence on renal dialysis: Secondary | ICD-10-CM | POA: Diagnosis not present

## 2019-06-23 DIAGNOSIS — N186 End stage renal disease: Secondary | ICD-10-CM | POA: Diagnosis not present

## 2019-06-24 DIAGNOSIS — Z992 Dependence on renal dialysis: Secondary | ICD-10-CM | POA: Diagnosis not present

## 2019-06-24 DIAGNOSIS — N186 End stage renal disease: Secondary | ICD-10-CM | POA: Diagnosis not present

## 2019-06-25 ENCOUNTER — Ambulatory Visit (INDEPENDENT_AMBULATORY_CARE_PROVIDER_SITE_OTHER): Payer: 59 | Admitting: Nurse Practitioner

## 2019-06-25 ENCOUNTER — Other Ambulatory Visit: Payer: Self-pay

## 2019-06-25 ENCOUNTER — Encounter (INDEPENDENT_AMBULATORY_CARE_PROVIDER_SITE_OTHER): Payer: Self-pay | Admitting: Nurse Practitioner

## 2019-06-25 VITALS — BP 164/111 | HR 48 | Ht 68.0 in | Wt 173.0 lb

## 2019-06-25 DIAGNOSIS — E878 Other disorders of electrolyte and fluid balance, not elsewhere classified: Secondary | ICD-10-CM | POA: Diagnosis not present

## 2019-06-25 DIAGNOSIS — D509 Iron deficiency anemia, unspecified: Secondary | ICD-10-CM | POA: Diagnosis not present

## 2019-06-25 DIAGNOSIS — Z992 Dependence on renal dialysis: Secondary | ICD-10-CM | POA: Diagnosis not present

## 2019-06-25 DIAGNOSIS — N186 End stage renal disease: Secondary | ICD-10-CM | POA: Diagnosis not present

## 2019-06-25 DIAGNOSIS — I161 Hypertensive emergency: Secondary | ICD-10-CM | POA: Diagnosis not present

## 2019-06-25 DIAGNOSIS — N179 Acute kidney failure, unspecified: Secondary | ICD-10-CM | POA: Diagnosis not present

## 2019-06-25 NOTE — Progress Notes (Signed)
Subjective:    Patient ID: Jeffrey Maynard, male    DOB: October 06, 1985, 34 y.o.   MRN: [id number] Chief Complaint  Patient presents with  . Follow-up    2 week [facility] first post op incision check    Patient presents today for follow up after peritoneal dialysis catheter placement on 05/24/2019.  The patient denies any issues following placement.  He denies any fever, chills, nausea, vomiting or diarrhea.  All incision sites look completely healed today.  The area around the catheter looks clean dry and intact with no evidence of infection.  The patient is currently doing dialysis at his dialysis center however he will be transitioning to home soon.  Overall the patient is doing well   Review of Systems  Skin: Positive for wound.  All other systems reviewed and are negative.      Objective:   Physical Exam Vitals reviewed.  Constitutional:      Appearance: Normal appearance.  Pulmonary:     Effort: Pulmonary effort is normal.     Breath sounds: Normal breath sounds.  Neurological:     General: No focal deficit present.     Mental Status: He is alert and oriented to person, place, and time.  Psychiatric:        Mood and Affect: Mood normal.        Behavior: Behavior normal.        Thought Content: Thought content normal.        Judgment: Judgment normal.     BP (!) 164/111   Pulse (!) 48   Ht 5\' 8"  (1.727 m)   Wt 173 lb (78.5 kg)   BMI 26.30 kg/m   Past Medical History:  Diagnosis Date  . Hypertension     Social History   Socioeconomic History  . Marital status: Married    Spouse name: Not on file  . Number of children: Not on file  . Years of education: Not on file  . Highest education level: Not on file  Occupational History  . Not on file  Tobacco Use  . Smoking status: Never Smoker  . Smokeless tobacco: Never Used  Substance and Sexual Activity  . Alcohol use: No  . Drug use: Never  . Sexual activity: Not on file  Other Topics Concern  . Not on file   Social History Narrative  . Not on file   Social Determinants of Health   Financial Resource Strain:   . Difficulty of Paying Living Expenses:   Food Insecurity:   . Worried About Charity fundraiser in the Last Year:   . Arboriculturist in the Last Year:   Transportation Needs:   . Film/video editor (Medical):   Marland Kitchen Lack of Transportation (Non-Medical):   Physical Activity:   . Days of Exercise per Week:   . Minutes of Exercise per Session:   Stress:   . Feeling of Stress :   Social Connections:   . Frequency of Communication with Friends and Family:   . Frequency of Social Gatherings with Friends and Family:   . Attends Religious Services:   . Active Member of Clubs or Organizations:   . Attends Archivist Meetings:   Marland Kitchen Marital Status:   Intimate Partner Violence:   . Fear of Current or Ex-Partner:   . Emotionally Abused:   Marland Kitchen Physically Abused:   . Sexually Abused:     Past Surgical History:  Procedure Laterality Date  .  CAPD INSERTION N/A 05/24/2019   Procedure: LAPAROSCOPIC INSERTION CONTINUOUS AMBULATORY PERITONEAL DIALYSIS  (CAPD) CATHETER;  Surgeon: Algernon Huxley, MD;  Location: [facility] ORS;  Service: General;  Laterality: N/A;    Family History  Problem Relation Age of Onset  . COPD Mother     No Known Allergies     Assessment & Plan:   1. Acute renal failure, unspecified acute renal failure type ([place]) Overall patient is doing well following peritoneal dialysis catheter placement.  The patient does not have an active fistula or graft.  Discussed with the patient that generally peritoneal dialysis catheter is not something we monitor on a regular basis however we can be consulted if there are issues.  Patient and/or dialysis center will reach out if there are issues that need attention.  Otherwise patient will follow up on an as-needed basis.  2. Hypertensive emergency Patient blood pressure slightly elevated today.  Not quite hypertensive emergency  however titration still needed.  Patient will continue work with PCP.  Patient in no acute distress.   Current Outpatient Medications on File Prior to Visit  Medication Sig Dispense Refill  . amLODipine (NORVASC) 10 MG tablet Take 1 tablet (10 mg total) by mouth daily. 30 tablet 1  . carvedilol (COREG) 25 MG tablet Take 1 tablet (25 mg total) by mouth 2 (two) times daily with a meal. 60 tablet 1  . cephALEXin (KEFLEX) 500 MG capsule     . gentamicin cream (GARAMYCIN) 0.1 %     . hydrALAZINE (APRESOLINE) 50 MG tablet Take 1 tablet (50 mg total) by mouth 3 (three) times daily. 90 tablet 1  . ipratropium (ATROVENT) 0.06 % nasal spray Place 2 sprays into both nostrils 2 (two) times daily.    Marland Kitchen LORazepam (ATIVAN) 0.5 MG tablet Take 1 tablet (0.5 mg total) by mouth at bedtime. 15 tablet 0  . losartan (COZAAR) 100 MG tablet Take 100 mg by mouth daily.    . montelukast (SINGULAIR) 10 MG tablet Take 10 mg by mouth daily.    . traMADol (ULTRAM) 50 MG tablet Take 1 tablet (50 mg total) by mouth every 12 (twelve) hours as needed for severe pain. 15 tablet 0   No current facility-administered medications on file prior to visit.    There are no Patient Instructions on file for this visit. No follow-ups on file.   Kris Hartmann, NP

## 2019-06-26 DIAGNOSIS — Z992 Dependence on renal dialysis: Secondary | ICD-10-CM | POA: Diagnosis not present

## 2019-06-26 DIAGNOSIS — N186 End stage renal disease: Secondary | ICD-10-CM | POA: Diagnosis not present

## 2019-06-27 DIAGNOSIS — Z992 Dependence on renal dialysis: Secondary | ICD-10-CM | POA: Diagnosis not present

## 2019-06-27 DIAGNOSIS — N186 End stage renal disease: Secondary | ICD-10-CM | POA: Diagnosis not present

## 2019-06-28 DIAGNOSIS — N186 End stage renal disease: Secondary | ICD-10-CM | POA: Diagnosis not present

## 2019-06-28 DIAGNOSIS — Z992 Dependence on renal dialysis: Secondary | ICD-10-CM | POA: Diagnosis not present

## 2019-06-29 DIAGNOSIS — Z992 Dependence on renal dialysis: Secondary | ICD-10-CM | POA: Diagnosis not present

## 2019-06-29 DIAGNOSIS — N186 End stage renal disease: Secondary | ICD-10-CM | POA: Diagnosis not present

## 2019-06-30 DIAGNOSIS — N186 End stage renal disease: Secondary | ICD-10-CM | POA: Diagnosis not present

## 2019-06-30 DIAGNOSIS — Z992 Dependence on renal dialysis: Secondary | ICD-10-CM | POA: Diagnosis not present

## 2019-07-01 DIAGNOSIS — Z992 Dependence on renal dialysis: Secondary | ICD-10-CM | POA: Diagnosis not present

## 2019-07-01 DIAGNOSIS — N186 End stage renal disease: Secondary | ICD-10-CM | POA: Diagnosis not present

## 2019-07-02 DIAGNOSIS — N186 End stage renal disease: Secondary | ICD-10-CM | POA: Diagnosis not present

## 2019-07-02 DIAGNOSIS — Z992 Dependence on renal dialysis: Secondary | ICD-10-CM | POA: Diagnosis not present

## 2019-07-03 DIAGNOSIS — Z992 Dependence on renal dialysis: Secondary | ICD-10-CM | POA: Diagnosis not present

## 2019-07-03 DIAGNOSIS — N186 End stage renal disease: Secondary | ICD-10-CM | POA: Diagnosis not present

## 2019-07-04 DIAGNOSIS — Z992 Dependence on renal dialysis: Secondary | ICD-10-CM | POA: Diagnosis not present

## 2019-07-04 DIAGNOSIS — N186 End stage renal disease: Secondary | ICD-10-CM | POA: Diagnosis not present

## 2019-07-05 DIAGNOSIS — Z992 Dependence on renal dialysis: Secondary | ICD-10-CM | POA: Diagnosis not present

## 2019-07-05 DIAGNOSIS — N186 End stage renal disease: Secondary | ICD-10-CM | POA: Diagnosis not present

## 2019-07-06 DIAGNOSIS — Z992 Dependence on renal dialysis: Secondary | ICD-10-CM | POA: Diagnosis not present

## 2019-07-06 DIAGNOSIS — N186 End stage renal disease: Secondary | ICD-10-CM | POA: Diagnosis not present

## 2019-07-07 DIAGNOSIS — N186 End stage renal disease: Secondary | ICD-10-CM | POA: Diagnosis not present

## 2019-07-07 DIAGNOSIS — Z992 Dependence on renal dialysis: Secondary | ICD-10-CM | POA: Diagnosis not present

## 2019-07-08 DIAGNOSIS — Z992 Dependence on renal dialysis: Secondary | ICD-10-CM | POA: Diagnosis not present

## 2019-07-08 DIAGNOSIS — N186 End stage renal disease: Secondary | ICD-10-CM | POA: Diagnosis not present

## 2019-07-09 DIAGNOSIS — Z992 Dependence on renal dialysis: Secondary | ICD-10-CM | POA: Diagnosis not present

## 2019-07-09 DIAGNOSIS — N186 End stage renal disease: Secondary | ICD-10-CM | POA: Diagnosis not present

## 2019-07-10 DIAGNOSIS — N186 End stage renal disease: Secondary | ICD-10-CM | POA: Diagnosis not present

## 2019-07-10 DIAGNOSIS — Z992 Dependence on renal dialysis: Secondary | ICD-10-CM | POA: Diagnosis not present

## 2019-07-11 DIAGNOSIS — Z992 Dependence on renal dialysis: Secondary | ICD-10-CM | POA: Diagnosis not present

## 2019-07-11 DIAGNOSIS — N186 End stage renal disease: Secondary | ICD-10-CM | POA: Diagnosis not present

## 2019-07-12 DIAGNOSIS — N186 End stage renal disease: Secondary | ICD-10-CM | POA: Diagnosis not present

## 2019-07-12 DIAGNOSIS — Z992 Dependence on renal dialysis: Secondary | ICD-10-CM | POA: Diagnosis not present

## 2019-07-13 DIAGNOSIS — Z992 Dependence on renal dialysis: Secondary | ICD-10-CM | POA: Diagnosis not present

## 2019-07-13 DIAGNOSIS — N186 End stage renal disease: Secondary | ICD-10-CM | POA: Diagnosis not present

## 2019-07-14 DIAGNOSIS — N186 End stage renal disease: Secondary | ICD-10-CM | POA: Diagnosis not present

## 2019-07-14 DIAGNOSIS — Z992 Dependence on renal dialysis: Secondary | ICD-10-CM | POA: Diagnosis not present

## 2019-07-15 DIAGNOSIS — Z992 Dependence on renal dialysis: Secondary | ICD-10-CM | POA: Diagnosis not present

## 2019-07-15 DIAGNOSIS — N186 End stage renal disease: Secondary | ICD-10-CM | POA: Diagnosis not present

## 2019-07-16 DIAGNOSIS — N186 End stage renal disease: Secondary | ICD-10-CM | POA: Diagnosis not present

## 2019-07-16 DIAGNOSIS — Z992 Dependence on renal dialysis: Secondary | ICD-10-CM | POA: Diagnosis not present

## 2019-07-17 DIAGNOSIS — Z992 Dependence on renal dialysis: Secondary | ICD-10-CM | POA: Diagnosis not present

## 2019-07-17 DIAGNOSIS — N186 End stage renal disease: Secondary | ICD-10-CM | POA: Diagnosis not present

## 2019-07-18 DIAGNOSIS — Z992 Dependence on renal dialysis: Secondary | ICD-10-CM | POA: Diagnosis not present

## 2019-07-18 DIAGNOSIS — N186 End stage renal disease: Secondary | ICD-10-CM | POA: Diagnosis not present

## 2019-07-19 DIAGNOSIS — Z992 Dependence on renal dialysis: Secondary | ICD-10-CM | POA: Diagnosis not present

## 2019-07-19 DIAGNOSIS — N186 End stage renal disease: Secondary | ICD-10-CM | POA: Diagnosis not present

## 2019-07-20 DIAGNOSIS — Z992 Dependence on renal dialysis: Secondary | ICD-10-CM | POA: Diagnosis not present

## 2019-07-20 DIAGNOSIS — N186 End stage renal disease: Secondary | ICD-10-CM | POA: Diagnosis not present

## 2019-07-21 DIAGNOSIS — N186 End stage renal disease: Secondary | ICD-10-CM | POA: Diagnosis not present

## 2019-07-21 DIAGNOSIS — Z992 Dependence on renal dialysis: Secondary | ICD-10-CM | POA: Diagnosis not present

## 2019-07-22 DIAGNOSIS — Z992 Dependence on renal dialysis: Secondary | ICD-10-CM | POA: Diagnosis not present

## 2019-07-22 DIAGNOSIS — N186 End stage renal disease: Secondary | ICD-10-CM | POA: Diagnosis not present

## 2019-07-23 DIAGNOSIS — Z992 Dependence on renal dialysis: Secondary | ICD-10-CM | POA: Diagnosis not present

## 2019-07-23 DIAGNOSIS — N186 End stage renal disease: Secondary | ICD-10-CM | POA: Diagnosis not present

## 2019-07-23 DIAGNOSIS — D509 Iron deficiency anemia, unspecified: Secondary | ICD-10-CM | POA: Diagnosis not present

## 2019-07-23 DIAGNOSIS — E878 Other disorders of electrolyte and fluid balance, not elsewhere classified: Secondary | ICD-10-CM | POA: Diagnosis not present

## 2019-07-24 DIAGNOSIS — Z992 Dependence on renal dialysis: Secondary | ICD-10-CM | POA: Diagnosis not present

## 2019-07-24 DIAGNOSIS — N186 End stage renal disease: Secondary | ICD-10-CM | POA: Diagnosis not present

## 2019-07-25 DIAGNOSIS — N186 End stage renal disease: Secondary | ICD-10-CM | POA: Diagnosis not present

## 2019-07-25 DIAGNOSIS — Z992 Dependence on renal dialysis: Secondary | ICD-10-CM | POA: Diagnosis not present

## 2019-07-26 DIAGNOSIS — Z992 Dependence on renal dialysis: Secondary | ICD-10-CM | POA: Diagnosis not present

## 2019-07-26 DIAGNOSIS — N186 End stage renal disease: Secondary | ICD-10-CM | POA: Diagnosis not present

## 2019-07-27 DIAGNOSIS — N186 End stage renal disease: Secondary | ICD-10-CM | POA: Diagnosis not present

## 2019-07-27 DIAGNOSIS — Z992 Dependence on renal dialysis: Secondary | ICD-10-CM | POA: Diagnosis not present

## 2019-07-28 DIAGNOSIS — N186 End stage renal disease: Secondary | ICD-10-CM | POA: Diagnosis not present

## 2019-07-28 DIAGNOSIS — Z992 Dependence on renal dialysis: Secondary | ICD-10-CM | POA: Diagnosis not present

## 2019-07-29 DIAGNOSIS — N186 End stage renal disease: Secondary | ICD-10-CM | POA: Diagnosis not present

## 2019-07-29 DIAGNOSIS — Z992 Dependence on renal dialysis: Secondary | ICD-10-CM | POA: Diagnosis not present

## 2019-07-30 DIAGNOSIS — N186 End stage renal disease: Secondary | ICD-10-CM | POA: Diagnosis not present

## 2019-07-30 DIAGNOSIS — Z992 Dependence on renal dialysis: Secondary | ICD-10-CM | POA: Diagnosis not present

## 2019-07-31 DIAGNOSIS — N186 End stage renal disease: Secondary | ICD-10-CM | POA: Diagnosis not present

## 2019-07-31 DIAGNOSIS — Z992 Dependence on renal dialysis: Secondary | ICD-10-CM | POA: Diagnosis not present

## 2019-08-01 DIAGNOSIS — Z992 Dependence on renal dialysis: Secondary | ICD-10-CM | POA: Diagnosis not present

## 2019-08-01 DIAGNOSIS — N186 End stage renal disease: Secondary | ICD-10-CM | POA: Diagnosis not present

## 2019-08-02 DIAGNOSIS — N186 End stage renal disease: Secondary | ICD-10-CM | POA: Diagnosis not present

## 2019-08-02 DIAGNOSIS — Z992 Dependence on renal dialysis: Secondary | ICD-10-CM | POA: Diagnosis not present

## 2019-08-03 DIAGNOSIS — N186 End stage renal disease: Secondary | ICD-10-CM | POA: Diagnosis not present

## 2019-08-03 DIAGNOSIS — Z992 Dependence on renal dialysis: Secondary | ICD-10-CM | POA: Diagnosis not present

## 2019-08-04 DIAGNOSIS — Z992 Dependence on renal dialysis: Secondary | ICD-10-CM | POA: Diagnosis not present

## 2019-08-04 DIAGNOSIS — N186 End stage renal disease: Secondary | ICD-10-CM | POA: Diagnosis not present

## 2019-08-05 DIAGNOSIS — N186 End stage renal disease: Secondary | ICD-10-CM | POA: Diagnosis not present

## 2019-08-05 DIAGNOSIS — Z992 Dependence on renal dialysis: Secondary | ICD-10-CM | POA: Diagnosis not present

## 2019-08-06 DIAGNOSIS — N186 End stage renal disease: Secondary | ICD-10-CM | POA: Diagnosis not present

## 2019-08-06 DIAGNOSIS — Z992 Dependence on renal dialysis: Secondary | ICD-10-CM | POA: Diagnosis not present

## 2019-08-07 DIAGNOSIS — N186 End stage renal disease: Secondary | ICD-10-CM | POA: Diagnosis not present

## 2019-08-07 DIAGNOSIS — Z992 Dependence on renal dialysis: Secondary | ICD-10-CM | POA: Diagnosis not present

## 2019-08-08 DIAGNOSIS — Z992 Dependence on renal dialysis: Secondary | ICD-10-CM | POA: Diagnosis not present

## 2019-08-08 DIAGNOSIS — N186 End stage renal disease: Secondary | ICD-10-CM | POA: Diagnosis not present

## 2019-08-09 DIAGNOSIS — N186 End stage renal disease: Secondary | ICD-10-CM | POA: Diagnosis not present

## 2019-08-09 DIAGNOSIS — Z992 Dependence on renal dialysis: Secondary | ICD-10-CM | POA: Diagnosis not present

## 2019-08-10 DIAGNOSIS — Z992 Dependence on renal dialysis: Secondary | ICD-10-CM | POA: Diagnosis not present

## 2019-08-10 DIAGNOSIS — N186 End stage renal disease: Secondary | ICD-10-CM | POA: Diagnosis not present

## 2019-08-11 DIAGNOSIS — Z992 Dependence on renal dialysis: Secondary | ICD-10-CM | POA: Diagnosis not present

## 2019-08-11 DIAGNOSIS — N186 End stage renal disease: Secondary | ICD-10-CM | POA: Diagnosis not present

## 2019-08-12 DIAGNOSIS — Z992 Dependence on renal dialysis: Secondary | ICD-10-CM | POA: Diagnosis not present

## 2019-08-12 DIAGNOSIS — N186 End stage renal disease: Secondary | ICD-10-CM | POA: Diagnosis not present

## 2019-08-13 DIAGNOSIS — N186 End stage renal disease: Secondary | ICD-10-CM | POA: Diagnosis not present

## 2019-08-13 DIAGNOSIS — Z992 Dependence on renal dialysis: Secondary | ICD-10-CM | POA: Diagnosis not present

## 2019-08-14 DIAGNOSIS — Z992 Dependence on renal dialysis: Secondary | ICD-10-CM | POA: Diagnosis not present

## 2019-08-14 DIAGNOSIS — N186 End stage renal disease: Secondary | ICD-10-CM | POA: Diagnosis not present

## 2019-08-15 DIAGNOSIS — Z992 Dependence on renal dialysis: Secondary | ICD-10-CM | POA: Diagnosis not present

## 2019-08-15 DIAGNOSIS — N186 End stage renal disease: Secondary | ICD-10-CM | POA: Diagnosis not present

## 2019-08-16 DIAGNOSIS — N186 End stage renal disease: Secondary | ICD-10-CM | POA: Diagnosis not present

## 2019-08-16 DIAGNOSIS — Z992 Dependence on renal dialysis: Secondary | ICD-10-CM | POA: Diagnosis not present

## 2019-08-17 DIAGNOSIS — N186 End stage renal disease: Secondary | ICD-10-CM | POA: Diagnosis not present

## 2019-08-17 DIAGNOSIS — Z992 Dependence on renal dialysis: Secondary | ICD-10-CM | POA: Diagnosis not present

## 2019-08-18 DIAGNOSIS — Z992 Dependence on renal dialysis: Secondary | ICD-10-CM | POA: Diagnosis not present

## 2019-08-18 DIAGNOSIS — N186 End stage renal disease: Secondary | ICD-10-CM | POA: Diagnosis not present

## 2019-08-19 DIAGNOSIS — Z992 Dependence on renal dialysis: Secondary | ICD-10-CM | POA: Diagnosis not present

## 2019-08-19 DIAGNOSIS — N186 End stage renal disease: Secondary | ICD-10-CM | POA: Diagnosis not present

## 2019-08-20 DIAGNOSIS — N186 End stage renal disease: Secondary | ICD-10-CM | POA: Diagnosis not present

## 2019-08-20 DIAGNOSIS — Z992 Dependence on renal dialysis: Secondary | ICD-10-CM | POA: Diagnosis not present

## 2019-08-21 DIAGNOSIS — N186 End stage renal disease: Secondary | ICD-10-CM | POA: Diagnosis not present

## 2019-08-21 DIAGNOSIS — Z992 Dependence on renal dialysis: Secondary | ICD-10-CM | POA: Diagnosis not present

## 2019-08-22 DIAGNOSIS — U071 COVID-19: Secondary | ICD-10-CM | POA: Diagnosis not present

## 2019-08-22 DIAGNOSIS — Z992 Dependence on renal dialysis: Secondary | ICD-10-CM | POA: Diagnosis not present

## 2019-08-22 DIAGNOSIS — N186 End stage renal disease: Secondary | ICD-10-CM | POA: Diagnosis not present

## 2019-08-22 DIAGNOSIS — Z03818 Encounter for observation for suspected exposure to other biological agents ruled out: Secondary | ICD-10-CM | POA: Diagnosis not present

## 2019-08-23 DIAGNOSIS — Z992 Dependence on renal dialysis: Secondary | ICD-10-CM | POA: Diagnosis not present

## 2019-08-23 DIAGNOSIS — N186 End stage renal disease: Secondary | ICD-10-CM | POA: Diagnosis not present

## 2019-08-24 DIAGNOSIS — Z992 Dependence on renal dialysis: Secondary | ICD-10-CM | POA: Diagnosis not present

## 2019-08-24 DIAGNOSIS — N186 End stage renal disease: Secondary | ICD-10-CM | POA: Diagnosis not present

## 2019-08-25 DIAGNOSIS — Z992 Dependence on renal dialysis: Secondary | ICD-10-CM | POA: Diagnosis not present

## 2019-08-25 DIAGNOSIS — N186 End stage renal disease: Secondary | ICD-10-CM | POA: Diagnosis not present

## 2019-08-26 DIAGNOSIS — Z992 Dependence on renal dialysis: Secondary | ICD-10-CM | POA: Diagnosis not present

## 2019-08-26 DIAGNOSIS — N186 End stage renal disease: Secondary | ICD-10-CM | POA: Diagnosis not present

## 2019-08-27 DIAGNOSIS — Z992 Dependence on renal dialysis: Secondary | ICD-10-CM | POA: Diagnosis not present

## 2019-08-27 DIAGNOSIS — N186 End stage renal disease: Secondary | ICD-10-CM | POA: Diagnosis not present

## 2019-08-28 DIAGNOSIS — N186 End stage renal disease: Secondary | ICD-10-CM | POA: Diagnosis not present

## 2019-08-28 DIAGNOSIS — Z992 Dependence on renal dialysis: Secondary | ICD-10-CM | POA: Diagnosis not present

## 2019-08-29 DIAGNOSIS — Z992 Dependence on renal dialysis: Secondary | ICD-10-CM | POA: Diagnosis not present

## 2019-08-29 DIAGNOSIS — N186 End stage renal disease: Secondary | ICD-10-CM | POA: Diagnosis not present

## 2019-08-30 DIAGNOSIS — Z992 Dependence on renal dialysis: Secondary | ICD-10-CM | POA: Diagnosis not present

## 2019-08-30 DIAGNOSIS — N186 End stage renal disease: Secondary | ICD-10-CM | POA: Diagnosis not present

## 2019-08-31 DIAGNOSIS — N186 End stage renal disease: Secondary | ICD-10-CM | POA: Diagnosis not present

## 2019-08-31 DIAGNOSIS — Z992 Dependence on renal dialysis: Secondary | ICD-10-CM | POA: Diagnosis not present

## 2019-09-01 DIAGNOSIS — Z992 Dependence on renal dialysis: Secondary | ICD-10-CM | POA: Diagnosis not present

## 2019-09-01 DIAGNOSIS — N186 End stage renal disease: Secondary | ICD-10-CM | POA: Diagnosis not present

## 2019-09-02 DIAGNOSIS — N186 End stage renal disease: Secondary | ICD-10-CM | POA: Diagnosis not present

## 2019-09-02 DIAGNOSIS — Z992 Dependence on renal dialysis: Secondary | ICD-10-CM | POA: Diagnosis not present

## 2019-09-03 DIAGNOSIS — D509 Iron deficiency anemia, unspecified: Secondary | ICD-10-CM | POA: Diagnosis not present

## 2019-09-03 DIAGNOSIS — Z13220 Encounter for screening for lipoid disorders: Secondary | ICD-10-CM | POA: Diagnosis not present

## 2019-09-03 DIAGNOSIS — N186 End stage renal disease: Secondary | ICD-10-CM | POA: Diagnosis not present

## 2019-09-03 DIAGNOSIS — Z992 Dependence on renal dialysis: Secondary | ICD-10-CM | POA: Diagnosis not present

## 2019-09-03 DIAGNOSIS — E878 Other disorders of electrolyte and fluid balance, not elsewhere classified: Secondary | ICD-10-CM | POA: Diagnosis not present

## 2019-09-04 DIAGNOSIS — Z992 Dependence on renal dialysis: Secondary | ICD-10-CM | POA: Diagnosis not present

## 2019-09-04 DIAGNOSIS — N186 End stage renal disease: Secondary | ICD-10-CM | POA: Diagnosis not present

## 2019-09-05 DIAGNOSIS — N186 End stage renal disease: Secondary | ICD-10-CM | POA: Diagnosis not present

## 2019-09-05 DIAGNOSIS — Z992 Dependence on renal dialysis: Secondary | ICD-10-CM | POA: Diagnosis not present

## 2019-09-06 DIAGNOSIS — Z992 Dependence on renal dialysis: Secondary | ICD-10-CM | POA: Diagnosis not present

## 2019-09-06 DIAGNOSIS — N186 End stage renal disease: Secondary | ICD-10-CM | POA: Diagnosis not present

## 2019-09-07 DIAGNOSIS — N186 End stage renal disease: Secondary | ICD-10-CM | POA: Diagnosis not present

## 2019-09-07 DIAGNOSIS — Z992 Dependence on renal dialysis: Secondary | ICD-10-CM | POA: Diagnosis not present

## 2019-09-08 DIAGNOSIS — N186 End stage renal disease: Secondary | ICD-10-CM | POA: Diagnosis not present

## 2019-09-08 DIAGNOSIS — Z992 Dependence on renal dialysis: Secondary | ICD-10-CM | POA: Diagnosis not present

## 2019-09-09 DIAGNOSIS — Z992 Dependence on renal dialysis: Secondary | ICD-10-CM | POA: Diagnosis not present

## 2019-09-09 DIAGNOSIS — N186 End stage renal disease: Secondary | ICD-10-CM | POA: Diagnosis not present

## 2019-09-10 DIAGNOSIS — N186 End stage renal disease: Secondary | ICD-10-CM | POA: Diagnosis not present

## 2019-09-10 DIAGNOSIS — Z992 Dependence on renal dialysis: Secondary | ICD-10-CM | POA: Diagnosis not present

## 2019-09-11 DIAGNOSIS — N186 End stage renal disease: Secondary | ICD-10-CM | POA: Diagnosis not present

## 2019-09-11 DIAGNOSIS — Z992 Dependence on renal dialysis: Secondary | ICD-10-CM | POA: Diagnosis not present

## 2019-09-12 DIAGNOSIS — N186 End stage renal disease: Secondary | ICD-10-CM | POA: Diagnosis not present

## 2019-09-12 DIAGNOSIS — Z992 Dependence on renal dialysis: Secondary | ICD-10-CM | POA: Diagnosis not present

## 2019-09-13 DIAGNOSIS — N186 End stage renal disease: Secondary | ICD-10-CM | POA: Diagnosis not present

## 2019-09-13 DIAGNOSIS — Z992 Dependence on renal dialysis: Secondary | ICD-10-CM | POA: Diagnosis not present

## 2019-09-14 DIAGNOSIS — Z992 Dependence on renal dialysis: Secondary | ICD-10-CM | POA: Diagnosis not present

## 2019-09-14 DIAGNOSIS — N186 End stage renal disease: Secondary | ICD-10-CM | POA: Diagnosis not present

## 2019-09-15 DIAGNOSIS — N186 End stage renal disease: Secondary | ICD-10-CM | POA: Diagnosis not present

## 2019-09-15 DIAGNOSIS — Z992 Dependence on renal dialysis: Secondary | ICD-10-CM | POA: Diagnosis not present

## 2019-09-16 DIAGNOSIS — Z992 Dependence on renal dialysis: Secondary | ICD-10-CM | POA: Diagnosis not present

## 2019-09-16 DIAGNOSIS — N186 End stage renal disease: Secondary | ICD-10-CM | POA: Diagnosis not present

## 2019-09-17 DIAGNOSIS — Z992 Dependence on renal dialysis: Secondary | ICD-10-CM | POA: Diagnosis not present

## 2019-09-17 DIAGNOSIS — E878 Other disorders of electrolyte and fluid balance, not elsewhere classified: Secondary | ICD-10-CM | POA: Diagnosis not present

## 2019-09-17 DIAGNOSIS — D509 Iron deficiency anemia, unspecified: Secondary | ICD-10-CM | POA: Diagnosis not present

## 2019-09-17 DIAGNOSIS — N186 End stage renal disease: Secondary | ICD-10-CM | POA: Diagnosis not present

## 2019-09-18 DIAGNOSIS — Z992 Dependence on renal dialysis: Secondary | ICD-10-CM | POA: Diagnosis not present

## 2019-09-18 DIAGNOSIS — N186 End stage renal disease: Secondary | ICD-10-CM | POA: Diagnosis not present

## 2019-09-19 DIAGNOSIS — Z992 Dependence on renal dialysis: Secondary | ICD-10-CM | POA: Diagnosis not present

## 2019-09-19 DIAGNOSIS — N186 End stage renal disease: Secondary | ICD-10-CM | POA: Diagnosis not present

## 2019-09-20 DIAGNOSIS — N186 End stage renal disease: Secondary | ICD-10-CM | POA: Diagnosis not present

## 2019-09-20 DIAGNOSIS — Z992 Dependence on renal dialysis: Secondary | ICD-10-CM | POA: Diagnosis not present

## 2019-09-21 DIAGNOSIS — N186 End stage renal disease: Secondary | ICD-10-CM | POA: Diagnosis not present

## 2019-09-21 DIAGNOSIS — Z992 Dependence on renal dialysis: Secondary | ICD-10-CM | POA: Diagnosis not present

## 2019-09-22 DIAGNOSIS — N186 End stage renal disease: Secondary | ICD-10-CM | POA: Diagnosis not present

## 2019-09-22 DIAGNOSIS — Z992 Dependence on renal dialysis: Secondary | ICD-10-CM | POA: Diagnosis not present

## 2019-09-23 DIAGNOSIS — Z992 Dependence on renal dialysis: Secondary | ICD-10-CM | POA: Diagnosis not present

## 2019-09-23 DIAGNOSIS — N186 End stage renal disease: Secondary | ICD-10-CM | POA: Diagnosis not present

## 2019-09-24 DIAGNOSIS — Z992 Dependence on renal dialysis: Secondary | ICD-10-CM | POA: Diagnosis not present

## 2019-09-24 DIAGNOSIS — N186 End stage renal disease: Secondary | ICD-10-CM | POA: Diagnosis not present

## 2019-09-25 DIAGNOSIS — Z992 Dependence on renal dialysis: Secondary | ICD-10-CM | POA: Diagnosis not present

## 2019-09-25 DIAGNOSIS — N186 End stage renal disease: Secondary | ICD-10-CM | POA: Diagnosis not present

## 2019-09-26 DIAGNOSIS — Z992 Dependence on renal dialysis: Secondary | ICD-10-CM | POA: Diagnosis not present

## 2019-09-26 DIAGNOSIS — N186 End stage renal disease: Secondary | ICD-10-CM | POA: Diagnosis not present

## 2019-09-27 DIAGNOSIS — N186 End stage renal disease: Secondary | ICD-10-CM | POA: Diagnosis not present

## 2019-09-27 DIAGNOSIS — Z992 Dependence on renal dialysis: Secondary | ICD-10-CM | POA: Diagnosis not present

## 2019-09-28 DIAGNOSIS — Z992 Dependence on renal dialysis: Secondary | ICD-10-CM | POA: Diagnosis not present

## 2019-09-28 DIAGNOSIS — N186 End stage renal disease: Secondary | ICD-10-CM | POA: Diagnosis not present

## 2019-09-29 DIAGNOSIS — N186 End stage renal disease: Secondary | ICD-10-CM | POA: Diagnosis not present

## 2019-09-29 DIAGNOSIS — Z992 Dependence on renal dialysis: Secondary | ICD-10-CM | POA: Diagnosis not present

## 2019-09-30 DIAGNOSIS — N186 End stage renal disease: Secondary | ICD-10-CM | POA: Diagnosis not present

## 2019-09-30 DIAGNOSIS — Z992 Dependence on renal dialysis: Secondary | ICD-10-CM | POA: Diagnosis not present

## 2019-10-01 DIAGNOSIS — Z992 Dependence on renal dialysis: Secondary | ICD-10-CM | POA: Diagnosis not present

## 2019-10-01 DIAGNOSIS — N186 End stage renal disease: Secondary | ICD-10-CM | POA: Diagnosis not present

## 2019-10-02 DIAGNOSIS — Z992 Dependence on renal dialysis: Secondary | ICD-10-CM | POA: Diagnosis not present

## 2019-10-02 DIAGNOSIS — N186 End stage renal disease: Secondary | ICD-10-CM | POA: Diagnosis not present

## 2019-10-03 DIAGNOSIS — Z992 Dependence on renal dialysis: Secondary | ICD-10-CM | POA: Diagnosis not present

## 2019-10-03 DIAGNOSIS — N186 End stage renal disease: Secondary | ICD-10-CM | POA: Diagnosis not present

## 2019-10-04 DIAGNOSIS — N186 End stage renal disease: Secondary | ICD-10-CM | POA: Diagnosis not present

## 2019-10-04 DIAGNOSIS — Z992 Dependence on renal dialysis: Secondary | ICD-10-CM | POA: Diagnosis not present

## 2019-10-05 DIAGNOSIS — N186 End stage renal disease: Secondary | ICD-10-CM | POA: Diagnosis not present

## 2019-10-05 DIAGNOSIS — Z992 Dependence on renal dialysis: Secondary | ICD-10-CM | POA: Diagnosis not present

## 2019-10-06 DIAGNOSIS — Z992 Dependence on renal dialysis: Secondary | ICD-10-CM | POA: Diagnosis not present

## 2019-10-06 DIAGNOSIS — N186 End stage renal disease: Secondary | ICD-10-CM | POA: Diagnosis not present

## 2019-10-07 DIAGNOSIS — N186 End stage renal disease: Secondary | ICD-10-CM | POA: Diagnosis not present

## 2019-10-07 DIAGNOSIS — Z992 Dependence on renal dialysis: Secondary | ICD-10-CM | POA: Diagnosis not present

## 2019-10-08 DIAGNOSIS — Z992 Dependence on renal dialysis: Secondary | ICD-10-CM | POA: Diagnosis not present

## 2019-10-08 DIAGNOSIS — N186 End stage renal disease: Secondary | ICD-10-CM | POA: Diagnosis not present

## 2019-10-09 DIAGNOSIS — N186 End stage renal disease: Secondary | ICD-10-CM | POA: Diagnosis not present

## 2019-10-09 DIAGNOSIS — Z992 Dependence on renal dialysis: Secondary | ICD-10-CM | POA: Diagnosis not present

## 2019-10-10 DIAGNOSIS — Z992 Dependence on renal dialysis: Secondary | ICD-10-CM | POA: Diagnosis not present

## 2019-10-10 DIAGNOSIS — N186 End stage renal disease: Secondary | ICD-10-CM | POA: Diagnosis not present

## 2019-10-11 DIAGNOSIS — Z992 Dependence on renal dialysis: Secondary | ICD-10-CM | POA: Diagnosis not present

## 2019-10-11 DIAGNOSIS — N186 End stage renal disease: Secondary | ICD-10-CM | POA: Diagnosis not present

## 2019-10-12 DIAGNOSIS — Z992 Dependence on renal dialysis: Secondary | ICD-10-CM | POA: Diagnosis not present

## 2019-10-12 DIAGNOSIS — N186 End stage renal disease: Secondary | ICD-10-CM | POA: Diagnosis not present

## 2019-10-13 DIAGNOSIS — Z992 Dependence on renal dialysis: Secondary | ICD-10-CM | POA: Diagnosis not present

## 2019-10-13 DIAGNOSIS — N186 End stage renal disease: Secondary | ICD-10-CM | POA: Diagnosis not present

## 2019-10-14 DIAGNOSIS — Z992 Dependence on renal dialysis: Secondary | ICD-10-CM | POA: Diagnosis not present

## 2019-10-14 DIAGNOSIS — N186 End stage renal disease: Secondary | ICD-10-CM | POA: Diagnosis not present

## 2019-10-15 DIAGNOSIS — N186 End stage renal disease: Secondary | ICD-10-CM | POA: Diagnosis not present

## 2019-10-15 DIAGNOSIS — Z992 Dependence on renal dialysis: Secondary | ICD-10-CM | POA: Diagnosis not present

## 2019-10-16 DIAGNOSIS — N186 End stage renal disease: Secondary | ICD-10-CM | POA: Diagnosis not present

## 2019-10-16 DIAGNOSIS — Z992 Dependence on renal dialysis: Secondary | ICD-10-CM | POA: Diagnosis not present

## 2019-10-17 DIAGNOSIS — N186 End stage renal disease: Secondary | ICD-10-CM | POA: Diagnosis not present

## 2019-10-17 DIAGNOSIS — Z992 Dependence on renal dialysis: Secondary | ICD-10-CM | POA: Diagnosis not present

## 2019-10-18 DIAGNOSIS — N186 End stage renal disease: Secondary | ICD-10-CM | POA: Diagnosis not present

## 2019-10-18 DIAGNOSIS — Z992 Dependence on renal dialysis: Secondary | ICD-10-CM | POA: Diagnosis not present

## 2019-10-19 DIAGNOSIS — Z992 Dependence on renal dialysis: Secondary | ICD-10-CM | POA: Diagnosis not present

## 2019-10-19 DIAGNOSIS — N186 End stage renal disease: Secondary | ICD-10-CM | POA: Diagnosis not present

## 2019-10-20 DIAGNOSIS — Z992 Dependence on renal dialysis: Secondary | ICD-10-CM | POA: Diagnosis not present

## 2019-10-20 DIAGNOSIS — N186 End stage renal disease: Secondary | ICD-10-CM | POA: Diagnosis not present

## 2019-10-21 DIAGNOSIS — N186 End stage renal disease: Secondary | ICD-10-CM | POA: Diagnosis not present

## 2019-10-21 DIAGNOSIS — Z992 Dependence on renal dialysis: Secondary | ICD-10-CM | POA: Diagnosis not present

## 2019-10-22 DIAGNOSIS — N186 End stage renal disease: Secondary | ICD-10-CM | POA: Diagnosis not present

## 2019-10-22 DIAGNOSIS — Z992 Dependence on renal dialysis: Secondary | ICD-10-CM | POA: Diagnosis not present

## 2019-10-23 DIAGNOSIS — N186 End stage renal disease: Secondary | ICD-10-CM | POA: Diagnosis not present

## 2019-10-23 DIAGNOSIS — Z992 Dependence on renal dialysis: Secondary | ICD-10-CM | POA: Diagnosis not present

## 2019-10-24 DIAGNOSIS — Z992 Dependence on renal dialysis: Secondary | ICD-10-CM | POA: Diagnosis not present

## 2019-10-24 DIAGNOSIS — N186 End stage renal disease: Secondary | ICD-10-CM | POA: Diagnosis not present

## 2019-10-25 DIAGNOSIS — E878 Other disorders of electrolyte and fluid balance, not elsewhere classified: Secondary | ICD-10-CM | POA: Diagnosis not present

## 2019-10-25 DIAGNOSIS — N186 End stage renal disease: Secondary | ICD-10-CM | POA: Diagnosis not present

## 2019-10-25 DIAGNOSIS — Z992 Dependence on renal dialysis: Secondary | ICD-10-CM | POA: Diagnosis not present

## 2019-10-25 DIAGNOSIS — D509 Iron deficiency anemia, unspecified: Secondary | ICD-10-CM | POA: Diagnosis not present

## 2019-10-26 DIAGNOSIS — N186 End stage renal disease: Secondary | ICD-10-CM | POA: Insufficient documentation

## 2019-10-26 DIAGNOSIS — Z01818 Encounter for other preprocedural examination: Secondary | ICD-10-CM | POA: Diagnosis not present

## 2019-10-26 DIAGNOSIS — I10 Essential (primary) hypertension: Secondary | ICD-10-CM | POA: Diagnosis not present

## 2019-10-26 DIAGNOSIS — Z992 Dependence on renal dialysis: Secondary | ICD-10-CM | POA: Diagnosis not present

## 2019-10-26 DIAGNOSIS — Z7682 Awaiting organ transplant status: Secondary | ICD-10-CM | POA: Diagnosis not present

## 2019-10-27 DIAGNOSIS — N186 End stage renal disease: Secondary | ICD-10-CM | POA: Diagnosis not present

## 2019-10-27 DIAGNOSIS — Z992 Dependence on renal dialysis: Secondary | ICD-10-CM | POA: Diagnosis not present

## 2019-10-28 DIAGNOSIS — Z992 Dependence on renal dialysis: Secondary | ICD-10-CM | POA: Diagnosis not present

## 2019-10-28 DIAGNOSIS — N186 End stage renal disease: Secondary | ICD-10-CM | POA: Diagnosis not present

## 2019-10-29 DIAGNOSIS — N186 End stage renal disease: Secondary | ICD-10-CM | POA: Diagnosis not present

## 2019-10-29 DIAGNOSIS — Z992 Dependence on renal dialysis: Secondary | ICD-10-CM | POA: Diagnosis not present

## 2019-10-30 DIAGNOSIS — Z992 Dependence on renal dialysis: Secondary | ICD-10-CM | POA: Diagnosis not present

## 2019-10-30 DIAGNOSIS — N186 End stage renal disease: Secondary | ICD-10-CM | POA: Diagnosis not present

## 2019-10-31 DIAGNOSIS — N186 End stage renal disease: Secondary | ICD-10-CM | POA: Diagnosis not present

## 2019-10-31 DIAGNOSIS — Z992 Dependence on renal dialysis: Secondary | ICD-10-CM | POA: Diagnosis not present

## 2019-11-01 DIAGNOSIS — N186 End stage renal disease: Secondary | ICD-10-CM | POA: Diagnosis not present

## 2019-11-01 DIAGNOSIS — Z992 Dependence on renal dialysis: Secondary | ICD-10-CM | POA: Diagnosis not present

## 2019-11-02 DIAGNOSIS — N186 End stage renal disease: Secondary | ICD-10-CM | POA: Diagnosis not present

## 2019-11-02 DIAGNOSIS — Z992 Dependence on renal dialysis: Secondary | ICD-10-CM | POA: Diagnosis not present

## 2019-11-03 DIAGNOSIS — Z992 Dependence on renal dialysis: Secondary | ICD-10-CM | POA: Diagnosis not present

## 2019-11-03 DIAGNOSIS — N186 End stage renal disease: Secondary | ICD-10-CM | POA: Diagnosis not present

## 2019-11-04 DIAGNOSIS — Z992 Dependence on renal dialysis: Secondary | ICD-10-CM | POA: Diagnosis not present

## 2019-11-04 DIAGNOSIS — N186 End stage renal disease: Secondary | ICD-10-CM | POA: Diagnosis not present

## 2019-11-05 DIAGNOSIS — N186 End stage renal disease: Secondary | ICD-10-CM | POA: Diagnosis not present

## 2019-11-05 DIAGNOSIS — Z992 Dependence on renal dialysis: Secondary | ICD-10-CM | POA: Diagnosis not present

## 2019-11-06 DIAGNOSIS — N186 End stage renal disease: Secondary | ICD-10-CM | POA: Diagnosis not present

## 2019-11-06 DIAGNOSIS — Z992 Dependence on renal dialysis: Secondary | ICD-10-CM | POA: Diagnosis not present

## 2019-11-07 DIAGNOSIS — I10 Essential (primary) hypertension: Secondary | ICD-10-CM | POA: Diagnosis not present

## 2019-11-07 DIAGNOSIS — Z23 Encounter for immunization: Secondary | ICD-10-CM | POA: Diagnosis not present

## 2019-11-07 DIAGNOSIS — Z01818 Encounter for other preprocedural examination: Secondary | ICD-10-CM | POA: Diagnosis not present

## 2019-11-07 DIAGNOSIS — Z992 Dependence on renal dialysis: Secondary | ICD-10-CM | POA: Diagnosis not present

## 2019-11-07 DIAGNOSIS — N186 End stage renal disease: Secondary | ICD-10-CM | POA: Diagnosis not present

## 2019-11-08 DIAGNOSIS — N186 End stage renal disease: Secondary | ICD-10-CM | POA: Diagnosis not present

## 2019-11-08 DIAGNOSIS — Z992 Dependence on renal dialysis: Secondary | ICD-10-CM | POA: Diagnosis not present

## 2019-11-09 DIAGNOSIS — N186 End stage renal disease: Secondary | ICD-10-CM | POA: Diagnosis not present

## 2019-11-09 DIAGNOSIS — Z992 Dependence on renal dialysis: Secondary | ICD-10-CM | POA: Diagnosis not present

## 2019-11-10 DIAGNOSIS — N186 End stage renal disease: Secondary | ICD-10-CM | POA: Diagnosis not present

## 2019-11-10 DIAGNOSIS — Z992 Dependence on renal dialysis: Secondary | ICD-10-CM | POA: Diagnosis not present

## 2019-11-11 DIAGNOSIS — Z992 Dependence on renal dialysis: Secondary | ICD-10-CM | POA: Diagnosis not present

## 2019-11-11 DIAGNOSIS — N186 End stage renal disease: Secondary | ICD-10-CM | POA: Diagnosis not present

## 2019-11-12 DIAGNOSIS — Z992 Dependence on renal dialysis: Secondary | ICD-10-CM | POA: Diagnosis not present

## 2019-11-12 DIAGNOSIS — N186 End stage renal disease: Secondary | ICD-10-CM | POA: Diagnosis not present

## 2019-11-13 DIAGNOSIS — N186 End stage renal disease: Secondary | ICD-10-CM | POA: Diagnosis not present

## 2019-11-13 DIAGNOSIS — Z992 Dependence on renal dialysis: Secondary | ICD-10-CM | POA: Diagnosis not present

## 2019-11-14 DIAGNOSIS — N186 End stage renal disease: Secondary | ICD-10-CM | POA: Diagnosis not present

## 2019-11-14 DIAGNOSIS — Z992 Dependence on renal dialysis: Secondary | ICD-10-CM | POA: Diagnosis not present

## 2019-11-15 DIAGNOSIS — Z992 Dependence on renal dialysis: Secondary | ICD-10-CM | POA: Diagnosis not present

## 2019-11-15 DIAGNOSIS — N186 End stage renal disease: Secondary | ICD-10-CM | POA: Diagnosis not present

## 2019-11-16 DIAGNOSIS — Z992 Dependence on renal dialysis: Secondary | ICD-10-CM | POA: Diagnosis not present

## 2019-11-16 DIAGNOSIS — N186 End stage renal disease: Secondary | ICD-10-CM | POA: Diagnosis not present

## 2019-11-17 DIAGNOSIS — N186 End stage renal disease: Secondary | ICD-10-CM | POA: Diagnosis not present

## 2019-11-17 DIAGNOSIS — Z992 Dependence on renal dialysis: Secondary | ICD-10-CM | POA: Diagnosis not present

## 2019-11-18 DIAGNOSIS — Z992 Dependence on renal dialysis: Secondary | ICD-10-CM | POA: Diagnosis not present

## 2019-11-18 DIAGNOSIS — N186 End stage renal disease: Secondary | ICD-10-CM | POA: Diagnosis not present

## 2019-11-19 DIAGNOSIS — I10 Essential (primary) hypertension: Secondary | ICD-10-CM | POA: Diagnosis not present

## 2019-11-19 DIAGNOSIS — Z0000 Encounter for general adult medical examination without abnormal findings: Secondary | ICD-10-CM | POA: Diagnosis not present

## 2019-11-19 DIAGNOSIS — E878 Other disorders of electrolyte and fluid balance, not elsewhere classified: Secondary | ICD-10-CM | POA: Diagnosis not present

## 2019-11-19 DIAGNOSIS — N186 End stage renal disease: Secondary | ICD-10-CM | POA: Diagnosis not present

## 2019-11-19 DIAGNOSIS — Z992 Dependence on renal dialysis: Secondary | ICD-10-CM | POA: Diagnosis not present

## 2019-11-20 DIAGNOSIS — N186 End stage renal disease: Secondary | ICD-10-CM | POA: Diagnosis not present

## 2019-11-20 DIAGNOSIS — Z992 Dependence on renal dialysis: Secondary | ICD-10-CM | POA: Diagnosis not present

## 2019-11-21 DIAGNOSIS — N186 End stage renal disease: Secondary | ICD-10-CM | POA: Diagnosis not present

## 2019-11-21 DIAGNOSIS — Z992 Dependence on renal dialysis: Secondary | ICD-10-CM | POA: Diagnosis not present

## 2019-11-22 DIAGNOSIS — Z992 Dependence on renal dialysis: Secondary | ICD-10-CM | POA: Diagnosis not present

## 2019-11-22 DIAGNOSIS — N186 End stage renal disease: Secondary | ICD-10-CM | POA: Diagnosis not present

## 2019-11-23 DIAGNOSIS — Z992 Dependence on renal dialysis: Secondary | ICD-10-CM | POA: Diagnosis not present

## 2019-11-23 DIAGNOSIS — N186 End stage renal disease: Secondary | ICD-10-CM | POA: Diagnosis not present

## 2019-11-24 DIAGNOSIS — Z992 Dependence on renal dialysis: Secondary | ICD-10-CM | POA: Diagnosis not present

## 2019-11-24 DIAGNOSIS — N186 End stage renal disease: Secondary | ICD-10-CM | POA: Diagnosis not present

## 2019-11-25 DIAGNOSIS — N186 End stage renal disease: Secondary | ICD-10-CM | POA: Diagnosis not present

## 2019-11-25 DIAGNOSIS — Z992 Dependence on renal dialysis: Secondary | ICD-10-CM | POA: Diagnosis not present

## 2019-11-26 DIAGNOSIS — Z992 Dependence on renal dialysis: Secondary | ICD-10-CM | POA: Diagnosis not present

## 2019-11-26 DIAGNOSIS — N186 End stage renal disease: Secondary | ICD-10-CM | POA: Diagnosis not present

## 2019-11-27 DIAGNOSIS — N186 End stage renal disease: Secondary | ICD-10-CM | POA: Diagnosis not present

## 2019-11-27 DIAGNOSIS — Z992 Dependence on renal dialysis: Secondary | ICD-10-CM | POA: Diagnosis not present

## 2019-11-28 DIAGNOSIS — N186 End stage renal disease: Secondary | ICD-10-CM | POA: Diagnosis not present

## 2019-11-28 DIAGNOSIS — Z992 Dependence on renal dialysis: Secondary | ICD-10-CM | POA: Diagnosis not present

## 2019-11-29 DIAGNOSIS — Z992 Dependence on renal dialysis: Secondary | ICD-10-CM | POA: Diagnosis not present

## 2019-11-29 DIAGNOSIS — N186 End stage renal disease: Secondary | ICD-10-CM | POA: Diagnosis not present

## 2019-11-30 DIAGNOSIS — N186 End stage renal disease: Secondary | ICD-10-CM | POA: Diagnosis not present

## 2019-11-30 DIAGNOSIS — Z7682 Awaiting organ transplant status: Secondary | ICD-10-CM | POA: Diagnosis not present

## 2019-11-30 DIAGNOSIS — Z01810 Encounter for preprocedural cardiovascular examination: Secondary | ICD-10-CM | POA: Diagnosis not present

## 2019-11-30 DIAGNOSIS — Z992 Dependence on renal dialysis: Secondary | ICD-10-CM | POA: Diagnosis not present

## 2019-12-01 DIAGNOSIS — N186 End stage renal disease: Secondary | ICD-10-CM | POA: Diagnosis not present

## 2019-12-01 DIAGNOSIS — Z992 Dependence on renal dialysis: Secondary | ICD-10-CM | POA: Diagnosis not present

## 2019-12-02 DIAGNOSIS — Z992 Dependence on renal dialysis: Secondary | ICD-10-CM | POA: Diagnosis not present

## 2019-12-02 DIAGNOSIS — N186 End stage renal disease: Secondary | ICD-10-CM | POA: Diagnosis not present

## 2019-12-03 DIAGNOSIS — N186 End stage renal disease: Secondary | ICD-10-CM | POA: Diagnosis not present

## 2019-12-03 DIAGNOSIS — Z992 Dependence on renal dialysis: Secondary | ICD-10-CM | POA: Diagnosis not present

## 2019-12-04 DIAGNOSIS — Z992 Dependence on renal dialysis: Secondary | ICD-10-CM | POA: Diagnosis not present

## 2019-12-04 DIAGNOSIS — Z7682 Awaiting organ transplant status: Secondary | ICD-10-CM | POA: Diagnosis not present

## 2019-12-04 DIAGNOSIS — N186 End stage renal disease: Secondary | ICD-10-CM | POA: Diagnosis not present

## 2019-12-05 DIAGNOSIS — Z992 Dependence on renal dialysis: Secondary | ICD-10-CM | POA: Diagnosis not present

## 2019-12-05 DIAGNOSIS — N186 End stage renal disease: Secondary | ICD-10-CM | POA: Diagnosis not present

## 2019-12-06 DIAGNOSIS — Z992 Dependence on renal dialysis: Secondary | ICD-10-CM | POA: Diagnosis not present

## 2019-12-06 DIAGNOSIS — N186 End stage renal disease: Secondary | ICD-10-CM | POA: Diagnosis not present

## 2019-12-07 DIAGNOSIS — N186 End stage renal disease: Secondary | ICD-10-CM | POA: Diagnosis not present

## 2019-12-07 DIAGNOSIS — Z992 Dependence on renal dialysis: Secondary | ICD-10-CM | POA: Diagnosis not present

## 2019-12-08 DIAGNOSIS — N186 End stage renal disease: Secondary | ICD-10-CM | POA: Diagnosis not present

## 2019-12-08 DIAGNOSIS — Z992 Dependence on renal dialysis: Secondary | ICD-10-CM | POA: Diagnosis not present

## 2019-12-09 DIAGNOSIS — Z992 Dependence on renal dialysis: Secondary | ICD-10-CM | POA: Diagnosis not present

## 2019-12-09 DIAGNOSIS — N186 End stage renal disease: Secondary | ICD-10-CM | POA: Diagnosis not present

## 2019-12-10 DIAGNOSIS — N186 End stage renal disease: Secondary | ICD-10-CM | POA: Diagnosis not present

## 2019-12-10 DIAGNOSIS — Z992 Dependence on renal dialysis: Secondary | ICD-10-CM | POA: Diagnosis not present

## 2019-12-11 DIAGNOSIS — N186 End stage renal disease: Secondary | ICD-10-CM | POA: Diagnosis not present

## 2019-12-11 DIAGNOSIS — Z992 Dependence on renal dialysis: Secondary | ICD-10-CM | POA: Diagnosis not present

## 2019-12-12 DIAGNOSIS — Z992 Dependence on renal dialysis: Secondary | ICD-10-CM | POA: Diagnosis not present

## 2019-12-12 DIAGNOSIS — N186 End stage renal disease: Secondary | ICD-10-CM | POA: Diagnosis not present

## 2019-12-13 DIAGNOSIS — N186 End stage renal disease: Secondary | ICD-10-CM | POA: Diagnosis not present

## 2019-12-13 DIAGNOSIS — Z992 Dependence on renal dialysis: Secondary | ICD-10-CM | POA: Diagnosis not present

## 2019-12-14 DIAGNOSIS — Z992 Dependence on renal dialysis: Secondary | ICD-10-CM | POA: Diagnosis not present

## 2019-12-14 DIAGNOSIS — N186 End stage renal disease: Secondary | ICD-10-CM | POA: Diagnosis not present

## 2019-12-15 DIAGNOSIS — N186 End stage renal disease: Secondary | ICD-10-CM | POA: Diagnosis not present

## 2019-12-15 DIAGNOSIS — Z992 Dependence on renal dialysis: Secondary | ICD-10-CM | POA: Diagnosis not present

## 2019-12-16 DIAGNOSIS — Z992 Dependence on renal dialysis: Secondary | ICD-10-CM | POA: Diagnosis not present

## 2019-12-16 DIAGNOSIS — N186 End stage renal disease: Secondary | ICD-10-CM | POA: Diagnosis not present

## 2019-12-17 DIAGNOSIS — N186 End stage renal disease: Secondary | ICD-10-CM | POA: Diagnosis not present

## 2019-12-17 DIAGNOSIS — Z992 Dependence on renal dialysis: Secondary | ICD-10-CM | POA: Diagnosis not present

## 2019-12-18 DIAGNOSIS — N186 End stage renal disease: Secondary | ICD-10-CM | POA: Diagnosis not present

## 2019-12-18 DIAGNOSIS — Z992 Dependence on renal dialysis: Secondary | ICD-10-CM | POA: Diagnosis not present

## 2019-12-19 DIAGNOSIS — N186 End stage renal disease: Secondary | ICD-10-CM | POA: Diagnosis not present

## 2019-12-19 DIAGNOSIS — R809 Proteinuria, unspecified: Secondary | ICD-10-CM | POA: Diagnosis not present

## 2019-12-19 DIAGNOSIS — Z992 Dependence on renal dialysis: Secondary | ICD-10-CM | POA: Diagnosis not present

## 2019-12-19 DIAGNOSIS — I10 Essential (primary) hypertension: Secondary | ICD-10-CM | POA: Diagnosis not present

## 2019-12-20 DIAGNOSIS — N186 End stage renal disease: Secondary | ICD-10-CM | POA: Diagnosis not present

## 2019-12-20 DIAGNOSIS — Z992 Dependence on renal dialysis: Secondary | ICD-10-CM | POA: Diagnosis not present

## 2019-12-21 DIAGNOSIS — E878 Other disorders of electrolyte and fluid balance, not elsewhere classified: Secondary | ICD-10-CM | POA: Diagnosis not present

## 2019-12-21 DIAGNOSIS — D509 Iron deficiency anemia, unspecified: Secondary | ICD-10-CM | POA: Diagnosis not present

## 2019-12-21 DIAGNOSIS — N186 End stage renal disease: Secondary | ICD-10-CM | POA: Diagnosis not present

## 2019-12-21 DIAGNOSIS — Z992 Dependence on renal dialysis: Secondary | ICD-10-CM | POA: Diagnosis not present

## 2019-12-22 DIAGNOSIS — Z992 Dependence on renal dialysis: Secondary | ICD-10-CM | POA: Diagnosis not present

## 2019-12-22 DIAGNOSIS — N186 End stage renal disease: Secondary | ICD-10-CM | POA: Diagnosis not present

## 2019-12-23 DIAGNOSIS — N186 End stage renal disease: Secondary | ICD-10-CM | POA: Diagnosis not present

## 2019-12-23 DIAGNOSIS — Z992 Dependence on renal dialysis: Secondary | ICD-10-CM | POA: Diagnosis not present

## 2019-12-24 DIAGNOSIS — Z992 Dependence on renal dialysis: Secondary | ICD-10-CM | POA: Diagnosis not present

## 2019-12-24 DIAGNOSIS — N186 End stage renal disease: Secondary | ICD-10-CM | POA: Diagnosis not present

## 2019-12-27 DIAGNOSIS — N186 End stage renal disease: Secondary | ICD-10-CM | POA: Diagnosis not present

## 2019-12-27 DIAGNOSIS — Z992 Dependence on renal dialysis: Secondary | ICD-10-CM | POA: Diagnosis not present

## 2019-12-29 DIAGNOSIS — Z992 Dependence on renal dialysis: Secondary | ICD-10-CM | POA: Diagnosis not present

## 2019-12-29 DIAGNOSIS — N186 End stage renal disease: Secondary | ICD-10-CM | POA: Diagnosis not present

## 2019-12-30 DIAGNOSIS — Z992 Dependence on renal dialysis: Secondary | ICD-10-CM | POA: Diagnosis not present

## 2019-12-30 DIAGNOSIS — N186 End stage renal disease: Secondary | ICD-10-CM | POA: Diagnosis not present

## 2020-01-03 DIAGNOSIS — N186 End stage renal disease: Secondary | ICD-10-CM | POA: Diagnosis not present

## 2020-01-03 DIAGNOSIS — Z992 Dependence on renal dialysis: Secondary | ICD-10-CM | POA: Diagnosis not present

## 2020-01-05 DIAGNOSIS — N186 End stage renal disease: Secondary | ICD-10-CM | POA: Diagnosis not present

## 2020-01-05 DIAGNOSIS — Z992 Dependence on renal dialysis: Secondary | ICD-10-CM | POA: Diagnosis not present

## 2020-01-06 DIAGNOSIS — N186 End stage renal disease: Secondary | ICD-10-CM | POA: Diagnosis not present

## 2020-01-06 DIAGNOSIS — Z992 Dependence on renal dialysis: Secondary | ICD-10-CM | POA: Diagnosis not present

## 2020-01-10 DIAGNOSIS — Z992 Dependence on renal dialysis: Secondary | ICD-10-CM | POA: Diagnosis not present

## 2020-01-10 DIAGNOSIS — N186 End stage renal disease: Secondary | ICD-10-CM | POA: Diagnosis not present

## 2020-01-12 DIAGNOSIS — N186 End stage renal disease: Secondary | ICD-10-CM | POA: Diagnosis not present

## 2020-01-12 DIAGNOSIS — Z992 Dependence on renal dialysis: Secondary | ICD-10-CM | POA: Diagnosis not present

## 2020-01-13 DIAGNOSIS — Z992 Dependence on renal dialysis: Secondary | ICD-10-CM | POA: Diagnosis not present

## 2020-01-13 DIAGNOSIS — N186 End stage renal disease: Secondary | ICD-10-CM | POA: Diagnosis not present

## 2020-01-15 DIAGNOSIS — Z992 Dependence on renal dialysis: Secondary | ICD-10-CM | POA: Diagnosis not present

## 2020-01-15 DIAGNOSIS — N186 End stage renal disease: Secondary | ICD-10-CM | POA: Diagnosis not present

## 2020-01-17 DIAGNOSIS — N186 End stage renal disease: Secondary | ICD-10-CM | POA: Diagnosis not present

## 2020-01-17 DIAGNOSIS — Z992 Dependence on renal dialysis: Secondary | ICD-10-CM | POA: Diagnosis not present

## 2020-01-19 DIAGNOSIS — N186 End stage renal disease: Secondary | ICD-10-CM | POA: Diagnosis not present

## 2020-01-19 DIAGNOSIS — Z992 Dependence on renal dialysis: Secondary | ICD-10-CM | POA: Diagnosis not present

## 2020-01-20 DIAGNOSIS — Z992 Dependence on renal dialysis: Secondary | ICD-10-CM | POA: Diagnosis not present

## 2020-01-20 DIAGNOSIS — N186 End stage renal disease: Secondary | ICD-10-CM | POA: Diagnosis not present

## 2020-01-24 DIAGNOSIS — Z992 Dependence on renal dialysis: Secondary | ICD-10-CM | POA: Diagnosis not present

## 2020-01-24 DIAGNOSIS — N186 End stage renal disease: Secondary | ICD-10-CM | POA: Diagnosis not present

## 2020-01-25 DIAGNOSIS — D509 Iron deficiency anemia, unspecified: Secondary | ICD-10-CM | POA: Diagnosis not present

## 2020-01-25 DIAGNOSIS — N186 End stage renal disease: Secondary | ICD-10-CM | POA: Diagnosis not present

## 2020-01-25 DIAGNOSIS — Z992 Dependence on renal dialysis: Secondary | ICD-10-CM | POA: Diagnosis not present

## 2020-01-25 DIAGNOSIS — E878 Other disorders of electrolyte and fluid balance, not elsewhere classified: Secondary | ICD-10-CM | POA: Diagnosis not present

## 2020-01-26 DIAGNOSIS — Z992 Dependence on renal dialysis: Secondary | ICD-10-CM | POA: Diagnosis not present

## 2020-01-26 DIAGNOSIS — N186 End stage renal disease: Secondary | ICD-10-CM | POA: Diagnosis not present

## 2020-01-27 DIAGNOSIS — Z992 Dependence on renal dialysis: Secondary | ICD-10-CM | POA: Diagnosis not present

## 2020-01-27 DIAGNOSIS — N186 End stage renal disease: Secondary | ICD-10-CM | POA: Diagnosis not present

## 2020-01-31 DIAGNOSIS — Z992 Dependence on renal dialysis: Secondary | ICD-10-CM | POA: Diagnosis not present

## 2020-01-31 DIAGNOSIS — N186 End stage renal disease: Secondary | ICD-10-CM | POA: Diagnosis not present

## 2020-02-02 DIAGNOSIS — N186 End stage renal disease: Secondary | ICD-10-CM | POA: Diagnosis not present

## 2020-02-02 DIAGNOSIS — Z992 Dependence on renal dialysis: Secondary | ICD-10-CM | POA: Diagnosis not present

## 2020-02-03 DIAGNOSIS — Z992 Dependence on renal dialysis: Secondary | ICD-10-CM | POA: Diagnosis not present

## 2020-02-03 DIAGNOSIS — N186 End stage renal disease: Secondary | ICD-10-CM | POA: Diagnosis not present

## 2020-02-06 DIAGNOSIS — N189 Chronic kidney disease, unspecified: Secondary | ICD-10-CM | POA: Insufficient documentation

## 2020-02-06 DIAGNOSIS — N2581 Secondary hyperparathyroidism of renal origin: Secondary | ICD-10-CM | POA: Insufficient documentation

## 2020-02-06 DIAGNOSIS — D631 Anemia in chronic kidney disease: Secondary | ICD-10-CM | POA: Diagnosis not present

## 2020-02-06 DIAGNOSIS — N186 End stage renal disease: Secondary | ICD-10-CM | POA: Diagnosis not present

## 2020-02-06 DIAGNOSIS — I120 Hypertensive chronic kidney disease with stage 5 chronic kidney disease or end stage renal disease: Secondary | ICD-10-CM | POA: Diagnosis not present

## 2020-02-06 DIAGNOSIS — I129 Hypertensive chronic kidney disease with stage 1 through stage 4 chronic kidney disease, or unspecified chronic kidney disease: Secondary | ICD-10-CM | POA: Insufficient documentation

## 2020-02-07 DIAGNOSIS — Z7682 Awaiting organ transplant status: Secondary | ICD-10-CM | POA: Diagnosis not present

## 2020-02-07 DIAGNOSIS — R509 Fever, unspecified: Secondary | ICD-10-CM | POA: Diagnosis not present

## 2020-02-07 DIAGNOSIS — Z992 Dependence on renal dialysis: Secondary | ICD-10-CM | POA: Diagnosis not present

## 2020-02-07 DIAGNOSIS — T8571XD Infection and inflammatory reaction due to peritoneal dialysis catheter, subsequent encounter: Secondary | ICD-10-CM | POA: Diagnosis not present

## 2020-02-07 DIAGNOSIS — T8571XA Infection and inflammatory reaction due to peritoneal dialysis catheter, initial encounter: Secondary | ICD-10-CM | POA: Diagnosis not present

## 2020-02-07 DIAGNOSIS — N186 End stage renal disease: Secondary | ICD-10-CM | POA: Diagnosis not present

## 2020-02-08 ENCOUNTER — Ambulatory Visit
Admission: RE | Admit: 2020-02-08 | Discharge: 2020-02-08 | Disposition: A | Discharge status: Home / Self Care | Payer: 59 | Source: Ambulatory Visit | Attending: Nephrology | Admitting: Nephrology

## 2020-02-08 ENCOUNTER — Other Ambulatory Visit
Admission: RE | Admit: 2020-02-08 | Discharge: 2020-02-08 | Disposition: A | Discharge status: Home / Self Care | Payer: 59 | Source: Ambulatory Visit | Attending: Nephrology | Admitting: Nephrology

## 2020-02-08 ENCOUNTER — Other Ambulatory Visit: Payer: Self-pay | Admitting: Nephrology

## 2020-02-08 DIAGNOSIS — N186 End stage renal disease: Secondary | ICD-10-CM | POA: Diagnosis not present

## 2020-02-08 DIAGNOSIS — K529 Noninfective gastroenteritis and colitis, unspecified: Secondary | ICD-10-CM | POA: Diagnosis not present

## 2020-02-09 DIAGNOSIS — Z992 Dependence on renal dialysis: Secondary | ICD-10-CM | POA: Diagnosis not present

## 2020-02-09 DIAGNOSIS — N186 End stage renal disease: Secondary | ICD-10-CM | POA: Diagnosis not present

## 2020-02-10 DIAGNOSIS — N186 End stage renal disease: Secondary | ICD-10-CM | POA: Diagnosis not present

## 2020-02-10 DIAGNOSIS — Z992 Dependence on renal dialysis: Secondary | ICD-10-CM | POA: Diagnosis not present

## 2020-02-12 ENCOUNTER — Emergency Department
Admission: EM | Admit: 2020-02-12 | Discharge: 2020-02-12 | Disposition: A | Discharge status: Home / Self Care | Payer: 59 | Attending: Emergency Medicine | Admitting: Emergency Medicine

## 2020-02-12 ENCOUNTER — Other Ambulatory Visit: Payer: Self-pay

## 2020-02-12 DIAGNOSIS — R109 Unspecified abdominal pain: Secondary | ICD-10-CM | POA: Diagnosis present

## 2020-02-12 DIAGNOSIS — Z79899 Other long term (current) drug therapy: Secondary | ICD-10-CM | POA: Insufficient documentation

## 2020-02-12 DIAGNOSIS — I10 Essential (primary) hypertension: Secondary | ICD-10-CM | POA: Diagnosis not present

## 2020-02-12 DIAGNOSIS — R1030 Lower abdominal pain, unspecified: Secondary | ICD-10-CM | POA: Diagnosis not present

## 2020-02-12 DIAGNOSIS — R197 Diarrhea, unspecified: Secondary | ICD-10-CM | POA: Insufficient documentation

## 2020-02-12 LAB — LIPASE, BLOOD: Lipase: 31 U/L (ref 11–51)

## 2020-02-12 LAB — COMPREHENSIVE METABOLIC PANEL
ALT: 12 U/L (ref 0–44)
AST: 15 U/L (ref 15–41)
Albumin: 3.2 g/dL — ABNORMAL LOW (ref 3.5–5.0)
Alkaline Phosphatase: 48 U/L (ref 38–126)
Anion gap: 10 (ref 5–15)
BUN: 31 mg/dL — ABNORMAL HIGH (ref 6–20)
CO2: 23 mmol/L (ref 22–32)
Calcium: 9.3 mg/dL (ref 8.9–10.3)
Chloride: 104 mmol/L (ref 98–111)
Creatinine, Ser: 3.23 mg/dL — ABNORMAL HIGH (ref 0.61–1.24)
GFR, Estimated: 25 mL/min — ABNORMAL LOW (ref >60)
Glucose, Bld: 112 mg/dL — ABNORMAL HIGH (ref 70–99)
Potassium: 4.4 mmol/L (ref 3.5–5.1)
Sodium: 137 mmol/L (ref 135–145)
Total Bilirubin: 0.7 mg/dL (ref 0.3–1.2)
Total Protein: 7.7 g/dL (ref 6.5–8.1)

## 2020-02-12 LAB — CBC
HCT: 33.1 % — ABNORMAL LOW (ref 39.0–52.0)
Hemoglobin: 11.2 g/dL — ABNORMAL LOW (ref 13.0–17.0)
MCH: 30.8 pg (ref 26.0–34.0)
MCHC: 33.8 g/dL (ref 30.0–36.0)
MCV: 90.9 fL (ref 80.0–100.0)
Platelets: 384 10*3/uL (ref 150–400)
RBC: 3.64 MIL/uL — ABNORMAL LOW (ref 4.22–5.81)
RDW: 12.1 % (ref 11.5–15.5)
WBC: 11 10*3/uL — ABNORMAL HIGH (ref 4.0–10.5)
nRBC: 0 % (ref 0.0–0.2)

## 2020-02-12 LAB — URINALYSIS, COMPLETE (UACMP) WITH MICROSCOPIC
Bacteria, UA: NONE SEEN
Bilirubin Urine: NEGATIVE
Glucose, UA: NEGATIVE mg/dL
Ketones, ur: NEGATIVE mg/dL
Leukocytes,Ua: NEGATIVE
Nitrite: NEGATIVE
Protein, ur: 30 mg/dL — AB
Specific Gravity, Urine: 1.018 (ref 1.005–1.030)
Squamous Epithelial / HPF: NONE SEEN (ref 0–5)
pH: 5 (ref 5.0–8.0)

## 2020-02-12 MED ORDER — DICYCLOMINE HCL 10 MG PO CAPS
20.0000 mg | ORAL_CAPSULE | Freq: Once | ORAL | Status: AC
  Administered 2020-02-12: 20 mg via ORAL
  Filled 2020-02-12: qty 2

## 2020-02-12 MED ORDER — DICYCLOMINE HCL 10 MG PO CAPS: 10.0000 mg | ORAL_CAPSULE | Freq: Four times a day (QID) | ORAL | 0 refills | Status: DC | PRN

## 2020-02-12 NOTE — ED Provider Notes (Signed)
[facility] Emergency Department Provider Note   ____________________________________________   I have reviewed the triage vital signs and the nursing notes.   HISTORY  Chief Complaint Abdominal Pain and Rectal Pain   History limited by: Not Limited   HPI Jeffrey Maynard is a 34 y.o. male who presents to the emergency department today at the request of his doctor because of concern for possible SBO that was seen on x-ray taken 4 days ago. The patient states that he has been having abdominal and rectal pain and discomfort. The abdominal pain is located in the lower abdomen. He has been having bowel movements, albeit diarrhea, and denies any vomiting. He denies any bloody bowel movements. Denies any intraabdominal surgeries, just peritoneal dialysis catheter placement. Denies any fevers.    Records reviewed. Per medical record review patient has a history of hypertension, ESRD on dialysis.   Past Medical History:  Diagnosis Date   Hypertension     Patient Active Problem List   Diagnosis Date Noted   Acute renal failure (ARF) ([place]) 05/21/2019   Hypertensive emergency 05/21/2019   Hypokalemia 05/21/2019   Thrombocytopenia ([place]) 05/21/2019   Elevated troponin 05/21/2019    Past Surgical History:  Procedure Laterality Date   CAPD INSERTION N/A 05/24/2019   Procedure: LAPAROSCOPIC INSERTION CONTINUOUS AMBULATORY PERITONEAL DIALYSIS  (CAPD) CATHETER;  Surgeon: Algernon Huxley, MD;  Location: [facility] ORS;  Service: General;  Laterality: N/A;    Prior to Admission medications   Medication Sig Start Date End Date Taking? Authorizing Provider  amLODipine (NORVASC) 10 MG tablet Take 1 tablet (10 mg total) by mouth daily. 05/26/19   Fritzi Mandes, MD  carvedilol (COREG) 25 MG tablet Take 1 tablet (25 mg total) by mouth 2 (two) times daily with a meal. 05/25/19   Fritzi Mandes, MD  cephALEXin [facility]) 500 MG capsule  06/09/19   [provider]  gentamicin  cream (GARAMYCIN) 0.1 %  06/09/19   [provider]  hydrALAZINE (APRESOLINE) 50 MG tablet Take 1 tablet (50 mg total) by mouth 3 (three) times daily. 05/25/19   Fritzi Mandes, MD  ipratropium (ATROVENT) 0.06 % nasal spray Place 2 sprays into both nostrils 2 (two) times daily. 05/17/19   [provider]  LORazepam (ATIVAN) 0.5 MG tablet Take 1 tablet (0.5 mg total) by mouth at bedtime. 05/25/19   Fritzi Mandes, MD  losartan (COZAAR) 100 MG tablet Take 100 mg by mouth daily. 06/09/19   [provider]  montelukast (SINGULAIR) 10 MG tablet Take 10 mg by mouth daily. 05/17/19   [provider]  traMADol (ULTRAM) 50 MG tablet Take 1 tablet (50 mg total) by mouth every 12 (twelve) hours as needed for severe pain. 05/25/19   Fritzi Mandes, MD    Allergies Patient has no known allergies.  Family History  Problem Relation Age of Onset   COPD Mother     Social History Social History   Tobacco Use   Smoking status: Never Smoker   Smokeless tobacco: Never Used  Substance Use Topics   Alcohol use: No   Drug use: Never    Review of Systems Constitutional: No fever/chills Eyes: No visual changes. ENT: No sore throat. Cardiovascular: Denies chest pain. Respiratory: Denies shortness of breath. Gastrointestinal: Positive for abdominal pain, diarrhea.  Genitourinary: Negative for dysuria. Musculoskeletal: Negative for back pain. Skin: Negative for rash. Neurological: Negative for headaches, focal weakness or numbness.  ____________________________________________   PHYSICAL EXAM:  VITAL SIGNS: ED Triage Vitals  Enc Vitals Group     BP 02/12/20 1222 114/64     Pulse Rate 02/12/20 1222 65     Resp 02/12/20 1222 16     Temp 02/12/20 1222 98.7 F (37.1 C)     Temp Source 02/12/20 1222 Oral     SpO2 02/12/20 1222 100 %     Weight 02/12/20 1223 183 lb (83 kg)     Height 02/12/20 1223 5\' 8"  (1.727 m)     Head Circumference --      Peak Flow --      Pain Score  02/12/20 1223 9   Constitutional: Alert and oriented.  Eyes: Conjunctivae are normal.  ENT      Head: Normocephalic and atraumatic.      Nose: No congestion/rhinnorhea.      Mouth/Throat: Mucous membranes are moist.      Neck: No stridor. Hematological/Lymphatic/Immunilogical: No cervical lymphadenopathy. Cardiovascular: Normal rate, regular rhythm.  No murmurs, rubs, or gallops.  Respiratory: Normal respiratory effort without tachypnea nor retractions. Breath sounds are clear and equal bilaterally. No wheezes/rales/rhonchi. Gastrointestinal: Soft and tender in the lower abdomen. Catheter in place.  Genitourinary: Deferred Musculoskeletal: Normal range of motion in all extremities. No lower extremity edema. Neurologic:  Normal speech and language. No gross focal neurologic deficits are appreciated.  Skin:  Skin is warm, dry and intact. No rash noted. Psychiatric: Mood and affect are normal. Speech and behavior are normal. Patient exhibits appropriate insight and judgment.  ____________________________________________    LABS (pertinent positives/negatives)  CMP na 137, k 4.4, glu 112, cr 3.23 CBC wbc 11.0, hgb 11.2, plt 384 Lipase 31 UA hazy, small hgb dipstick, protein 30, rbc and wbc 0-5 ____________________________________________   EKG  None  ____________________________________________    RADIOLOGY  None  ____________________________________________   PROCEDURES  Procedures  ____________________________________________   INITIAL IMPRESSION / ASSESSMENT AND PLAN / ED COURSE  Pertinent labs & imaging results that were available during my care of the patient were reviewed by me and considered in my medical decision making (see chart for details).   Patient presented to the emergency department today because of concerns for potential small bowel obstruction seen on x-ray 4 days ago.  He states he was instructed to come here by his doctor.  Patient does tell me  he has been having diarrhea.  He does have some lower abdominal discomfort.  X-ray was read as possible SBO versus ileus versus enteritis.  This point I doubt SBO given that he is passing stool and is not having horrible nausea or vomiting.  Blood work without any concerning leukocytosis.  He did feel better after Bentyl.  At this point I do think gastroenteritis likely.  Discussed this with the patient.  Do not feel any CT imaging is necessary at this time.  Will give patient prescription for Bentyl.  Discussed return precautions. ____________________________________________   FINAL CLINICAL IMPRESSION(S) / ED DIAGNOSES  Final diagnoses:  Abdominal pain, unspecified abdominal location     Note: This dictation was prepared with Dragon dictation. Any transcriptional errors that result from this process are unintentional     Nance Pear, MD 02/12/20 1857

## 2020-02-12 NOTE — Discharge Instructions (Addendum)
Please seek medical attention for any high fevers, chest pain, shortness of breath, change in behavior, persistent vomiting, bloody stool or any other new or concerning symptoms.  

## 2020-02-12 NOTE — ED Triage Notes (Signed)
Pt reports lower abd pain and rectal pain for the past couple days, states that he had an xray performed and was told by dr Jeffrey Maynard to come to the hospital because it looked like he had a blockage, pt states that he is having a bm but it is runny and not as much, pt also states that he is a dialysis pt

## 2020-02-14 DIAGNOSIS — N186 End stage renal disease: Secondary | ICD-10-CM | POA: Diagnosis not present

## 2020-02-14 DIAGNOSIS — Z992 Dependence on renal dialysis: Secondary | ICD-10-CM | POA: Diagnosis not present

## 2020-02-15 DIAGNOSIS — N186 End stage renal disease: Secondary | ICD-10-CM | POA: Diagnosis not present

## 2020-02-15 DIAGNOSIS — Z992 Dependence on renal dialysis: Secondary | ICD-10-CM | POA: Diagnosis not present

## 2020-02-16 DIAGNOSIS — N186 End stage renal disease: Secondary | ICD-10-CM | POA: Diagnosis not present

## 2020-02-16 DIAGNOSIS — Z992 Dependence on renal dialysis: Secondary | ICD-10-CM | POA: Diagnosis not present

## 2020-02-17 DIAGNOSIS — N186 End stage renal disease: Secondary | ICD-10-CM | POA: Diagnosis not present

## 2020-02-17 DIAGNOSIS — Z992 Dependence on renal dialysis: Secondary | ICD-10-CM | POA: Diagnosis not present

## 2020-02-21 DIAGNOSIS — Z992 Dependence on renal dialysis: Secondary | ICD-10-CM | POA: Diagnosis not present

## 2020-02-21 DIAGNOSIS — N186 End stage renal disease: Secondary | ICD-10-CM | POA: Diagnosis not present

## 2020-03-05 ENCOUNTER — Other Ambulatory Visit: Payer: Self-pay

## 2020-03-05 ENCOUNTER — Inpatient Hospital Stay
Admission: EM | Admit: 2020-03-05 | Discharge: 2020-03-11 | DRG: 907 | Disposition: A | Discharge status: Home / Self Care | Payer: 59 | Attending: Family Medicine | Admitting: Family Medicine

## 2020-03-05 ENCOUNTER — Encounter: Payer: Self-pay | Admitting: Emergency Medicine

## 2020-03-05 ENCOUNTER — Emergency Department: Payer: 59

## 2020-03-05 DIAGNOSIS — N1832 Chronic kidney disease, stage 3b: Secondary | ICD-10-CM | POA: Diagnosis not present

## 2020-03-05 DIAGNOSIS — T8571XA Infection and inflammatory reaction due to peritoneal dialysis catheter, initial encounter: Principal | ICD-10-CM | POA: Diagnosis present

## 2020-03-05 DIAGNOSIS — I129 Hypertensive chronic kidney disease with stage 1 through stage 4 chronic kidney disease, or unspecified chronic kidney disease: Secondary | ICD-10-CM | POA: Diagnosis not present

## 2020-03-05 DIAGNOSIS — Y812 Prosthetic and other implants, materials and accessory general- and plastic-surgery devices associated with adverse incidents: Secondary | ICD-10-CM | POA: Diagnosis present

## 2020-03-05 DIAGNOSIS — Z20822 Contact with and (suspected) exposure to covid-19: Secondary | ICD-10-CM | POA: Diagnosis present

## 2020-03-05 DIAGNOSIS — Z4902 Encounter for fitting and adjustment of peritoneal dialysis catheter: Secondary | ICD-10-CM | POA: Diagnosis not present

## 2020-03-05 DIAGNOSIS — E871 Hypo-osmolality and hyponatremia: Secondary | ICD-10-CM | POA: Diagnosis not present

## 2020-03-05 DIAGNOSIS — K56600 Partial intestinal obstruction, unspecified as to cause: Secondary | ICD-10-CM | POA: Diagnosis present

## 2020-03-05 DIAGNOSIS — I10 Essential (primary) hypertension: Secondary | ICD-10-CM

## 2020-03-05 DIAGNOSIS — Z79899 Other long term (current) drug therapy: Secondary | ICD-10-CM | POA: Diagnosis not present

## 2020-03-05 DIAGNOSIS — N186 End stage renal disease: Secondary | ICD-10-CM | POA: Diagnosis present

## 2020-03-05 DIAGNOSIS — T85611A Breakdown (mechanical) of intraperitoneal dialysis catheter, initial encounter: Secondary | ICD-10-CM

## 2020-03-05 DIAGNOSIS — K567 Ileus, unspecified: Secondary | ICD-10-CM | POA: Diagnosis present

## 2020-03-05 DIAGNOSIS — R809 Proteinuria, unspecified: Secondary | ICD-10-CM | POA: Diagnosis not present

## 2020-03-05 DIAGNOSIS — R112 Nausea with vomiting, unspecified: Secondary | ICD-10-CM | POA: Diagnosis not present

## 2020-03-05 DIAGNOSIS — K650 Generalized (acute) peritonitis: Secondary | ICD-10-CM | POA: Diagnosis not present

## 2020-03-05 DIAGNOSIS — Z992 Dependence on renal dialysis: Secondary | ICD-10-CM

## 2020-03-05 DIAGNOSIS — D631 Anemia in chronic kidney disease: Secondary | ICD-10-CM | POA: Diagnosis not present

## 2020-03-05 DIAGNOSIS — K660 Peritoneal adhesions (postprocedural) (postinfection): Secondary | ICD-10-CM | POA: Diagnosis present

## 2020-03-05 DIAGNOSIS — R1033 Periumbilical pain: Secondary | ICD-10-CM | POA: Diagnosis not present

## 2020-03-05 DIAGNOSIS — R1110 Vomiting, unspecified: Secondary | ICD-10-CM | POA: Diagnosis not present

## 2020-03-05 DIAGNOSIS — R7989 Other specified abnormal findings of blood chemistry: Secondary | ICD-10-CM | POA: Diagnosis not present

## 2020-03-05 DIAGNOSIS — R109 Unspecified abdominal pain: Secondary | ICD-10-CM | POA: Diagnosis not present

## 2020-03-05 DIAGNOSIS — T85691A Other mechanical complication of intraperitoneal dialysis catheter, initial encounter: Secondary | ICD-10-CM | POA: Diagnosis not present

## 2020-03-05 DIAGNOSIS — R1013 Epigastric pain: Secondary | ICD-10-CM | POA: Diagnosis not present

## 2020-03-05 DIAGNOSIS — N2581 Secondary hyperparathyroidism of renal origin: Secondary | ICD-10-CM | POA: Diagnosis not present

## 2020-03-05 DIAGNOSIS — N184 Chronic kidney disease, stage 4 (severe): Secondary | ICD-10-CM | POA: Diagnosis not present

## 2020-03-05 DIAGNOSIS — I120 Hypertensive chronic kidney disease with stage 5 chronic kidney disease or end stage renal disease: Secondary | ICD-10-CM | POA: Diagnosis not present

## 2020-03-05 DIAGNOSIS — N189 Chronic kidney disease, unspecified: Secondary | ICD-10-CM | POA: Diagnosis not present

## 2020-03-05 DIAGNOSIS — N179 Acute kidney failure, unspecified: Secondary | ICD-10-CM | POA: Diagnosis not present

## 2020-03-05 DIAGNOSIS — B9561 Methicillin susceptible Staphylococcus aureus infection as the cause of diseases classified elsewhere: Secondary | ICD-10-CM | POA: Diagnosis not present

## 2020-03-05 LAB — CBC
HCT: 38.9 % — ABNORMAL LOW (ref 39.0–52.0)
Hemoglobin: 13.7 g/dL (ref 13.0–17.0)
MCH: 33 pg (ref 26.0–34.0)
MCHC: 35.2 g/dL (ref 30.0–36.0)
MCV: 93.7 fL (ref 80.0–100.0)
Platelets: 234 10*3/uL (ref 150–400)
RBC: 4.15 MIL/uL — ABNORMAL LOW (ref 4.22–5.81)
RDW: 11.5 % (ref 11.5–15.5)
WBC: 7.3 10*3/uL (ref 4.0–10.5)
nRBC: 0 % (ref 0.0–0.2)

## 2020-03-05 LAB — RESP PANEL BY RT-PCR (FLU A&B, COVID) ARPGX2
Influenza A by PCR: NEGATIVE
Influenza B by PCR: NEGATIVE
SARS Coronavirus 2 by RT PCR: NEGATIVE

## 2020-03-05 LAB — COMPREHENSIVE METABOLIC PANEL
ALT: 12 U/L (ref 0–44)
AST: 20 U/L (ref 15–41)
Albumin: 4.5 g/dL (ref 3.5–5.0)
Alkaline Phosphatase: 62 U/L (ref 38–126)
Anion gap: 12 (ref 5–15)
BUN: 12 mg/dL (ref 6–20)
CO2: 26 mmol/L (ref 22–32)
Calcium: 9.6 mg/dL (ref 8.9–10.3)
Chloride: 102 mmol/L (ref 98–111)
Creatinine, Ser: 1.07 mg/dL (ref 0.61–1.24)
GFR, Estimated: >60 mL/min (ref >60)
Glucose, Bld: 87 mg/dL (ref 70–99)
Potassium: 3.6 mmol/L (ref 3.5–5.1)
Sodium: 140 mmol/L (ref 135–145)
Total Bilirubin: 0.9 mg/dL (ref 0.3–1.2)
Total Protein: 7.8 g/dL (ref 6.5–8.1)

## 2020-03-05 LAB — URINALYSIS, COMPLETE (UACMP) WITH MICROSCOPIC
Bilirubin Urine: NEGATIVE
Glucose, UA: NEGATIVE mg/dL
Ketones, ur: 5 mg/dL — AB
Leukocytes,Ua: NEGATIVE
Nitrite: NEGATIVE
Protein, ur: 100 mg/dL — AB
Specific Gravity, Urine: 1.029 (ref 1.005–1.030)
Squamous Epithelial / HPF: NONE SEEN (ref 0–5)
pH: 5 (ref 5.0–8.0)

## 2020-03-05 LAB — LIPASE, BLOOD: Lipase: 26 U/L (ref 11–51)

## 2020-03-05 MED ORDER — AMLODIPINE BESYLATE 10 MG PO TABS
10.0000 mg | ORAL_TABLET | Freq: Every day | ORAL | Status: DC
Start: 2020-03-05 — End: 2020-03-11
  Administered 2020-03-06 – 2020-03-11 (×5): 10 mg via ORAL
  Filled 2020-03-05 (×4): qty 1
  Filled 2020-03-05 (×2): qty 2

## 2020-03-05 MED ORDER — VANCOMYCIN HCL 2000 MG/400ML IV SOLN: 2000.0000 mg | Freq: Once | INTRAVENOUS | Status: DC

## 2020-03-05 MED ORDER — FUROSEMIDE 20 MG PO TABS
20.0000 mg | ORAL_TABLET | Freq: Every day | ORAL | Status: DC
  Administered 2020-03-05 – 2020-03-11 (×6): 20 mg via ORAL
  Filled 2020-03-05 (×6): qty 1

## 2020-03-05 MED ORDER — SODIUM CHLORIDE 0.9 % IV SOLN
2.0000 g | Freq: Three times a day (TID) | INTRAVENOUS | Status: DC
  Administered 2020-03-05 – 2020-03-06 (×2): 2 g via INTRAVENOUS
  Filled 2020-03-05 (×5): qty 2

## 2020-03-05 MED ORDER — ONDANSETRON HCL 4 MG/2ML IJ SOLN
4.0000 mg | Freq: Four times a day (QID) | INTRAMUSCULAR | Status: DC | PRN
  Administered 2020-03-06: 4 mg via INTRAVENOUS
  Filled 2020-03-05: qty 2

## 2020-03-05 MED ORDER — ONDANSETRON HCL 4 MG/2ML IJ SOLN
4.0000 mg | Freq: Once | INTRAMUSCULAR | Status: AC
  Administered 2020-03-05: 4 mg via INTRAVENOUS
  Filled 2020-03-05: qty 2

## 2020-03-05 MED ORDER — CARVEDILOL 25 MG PO TABS
25.0000 mg | ORAL_TABLET | Freq: Two times a day (BID) | ORAL | Status: DC
  Administered 2020-03-06 – 2020-03-11 (×11): 25 mg via ORAL
  Filled 2020-03-05: qty 4
  Filled 2020-03-05 (×5): qty 1
  Filled 2020-03-05: qty 4
  Filled 2020-03-05 (×6): qty 1

## 2020-03-05 MED ORDER — VANCOMYCIN HCL 1750 MG/350ML IV SOLN
1750.0000 mg | Freq: Once | INTRAVENOUS | Status: AC
  Administered 2020-03-05: 1750 mg via INTRAVENOUS
  Filled 2020-03-05: qty 350

## 2020-03-05 MED ORDER — MONTELUKAST SODIUM 10 MG PO TABS
10.0000 mg | ORAL_TABLET | Freq: Every day | ORAL | Status: DC
  Administered 2020-03-05 – 2020-03-11 (×6): 10 mg via ORAL
  Filled 2020-03-05 (×7): qty 1

## 2020-03-05 MED ORDER — VANCOMYCIN HCL 1250 MG/250ML IV SOLN
1250.0000 mg | Freq: Two times a day (BID) | INTRAVENOUS | Status: DC
  Administered 2020-03-06: 1250 mg via INTRAVENOUS
  Filled 2020-03-05 (×2): qty 250

## 2020-03-05 MED ORDER — SODIUM CHLORIDE 0.9 % IV SOLN: Freq: Once | INTRAVENOUS | Status: AC

## 2020-03-05 MED ORDER — HYDROMORPHONE HCL 1 MG/ML IJ SOLN
0.5000 mg | Freq: Once | INTRAMUSCULAR | Status: AC
  Administered 2020-03-05: 0.5 mg via INTRAVENOUS
  Filled 2020-03-05: qty 1

## 2020-03-05 MED ORDER — SODIUM CHLORIDE 0.45 % IV SOLN: INTRAVENOUS | Status: DC

## 2020-03-05 MED ORDER — SODIUM CHLORIDE 0.9% FLUSH
3.0000 mL | Freq: Two times a day (BID) | INTRAVENOUS | Status: DC
  Administered 2020-03-05 – 2020-03-10 (×10): 3 mL via INTRAVENOUS

## 2020-03-05 MED ORDER — IOHEXOL 300 MG/ML  SOLN
100.0000 mL | Freq: Once | INTRAMUSCULAR | Status: AC | PRN
  Administered 2020-03-05: 100 mL via INTRAVENOUS

## 2020-03-05 MED ORDER — IRBESARTAN 150 MG PO TABS
150.0000 mg | ORAL_TABLET | Freq: Every evening | ORAL | Status: DC
  Administered 2020-03-06: 150 mg via ORAL
  Filled 2020-03-05 (×2): qty 1

## 2020-03-05 MED ORDER — ONDANSETRON HCL 4 MG PO TABS: 4.0000 mg | ORAL_TABLET | Freq: Four times a day (QID) | ORAL | Status: DC | PRN

## 2020-03-05 MED ORDER — MORPHINE SULFATE (PF) 2 MG/ML IV SOLN
2.0000 mg | INTRAVENOUS | Status: DC | PRN
  Administered 2020-03-05 – 2020-03-08 (×7): 2 mg via INTRAVENOUS
  Filled 2020-03-05 (×7): qty 1

## 2020-03-05 MED ORDER — VANCOMYCIN HCL 750 MG/150ML IV SOLN: 750.0000 mg | Freq: Three times a day (TID) | INTRAVENOUS | Status: DC

## 2020-03-05 MED ORDER — ENOXAPARIN SODIUM 40 MG/0.4ML ~~LOC~~ SOLN
40.0000 mg | SUBCUTANEOUS | Status: DC
  Administered 2020-03-05 – 2020-03-10 (×6): 40 mg via SUBCUTANEOUS
  Filled 2020-03-05 (×6): qty 0.4

## 2020-03-05 NOTE — Consult Note (Addendum)
Pharmacy Antibiotic Note  Jeffrey Maynard is a 35 y.o. male admitted on 03/05/2020 with peritonitis. Pt presented with generalized abdominal pain, nausea, and vomiting. Pt states LBM Sunday. Pt was seen in ED with similar complaints of lower abdominal pain and rectal pain on 12/28. Pharmacy has been consulted for vancomycin and cefepime dosing.  12/24 abdominal Xray: Multiple mildly dilated loops of small bowel with air-fluid levels, could reflect enteritis, ileus, or partial small bowel obstruction.  1/19 Abdominal CT: Dilatation of mid small bowel loops, which could be indicative of early or partial small bowel obstruction. However, given the presence of some peritoneal thickening and enhancement and haziness in the fat of the small bowel mesentery associated with these dilated loops of bowel, the possibility of peritonitis and regional small bowel ileus also warrants consideration.  WBC WNL, afebrile  Plan: Vancomycin 1750 mg IV x 1, followed by Vancomycin 1250 mg IV every 12 hours.  Goal trough 15-20 mcg/mL. Calculated AUC 494 cefepime 2 g IV q8h  Monitor renal function with AM labs  Height: '5\' 8"'$  (172.7 cm) Weight: 76.2 kg (168 lb) IBW/kg (Calculated) : 68.4  Temp (24hrs), Avg:98.3 F (36.8 C), Min:98.3 F (36.8 C), Max:98.3 F (36.8 C)  Recent Labs  Lab 03/05/20 1236  WBC 7.3  CREATININE 1.07    Estimated Creatinine Clearance: 95 mL/min (by C-G formula based on SCr of 1.07 mg/dL).    No Known Allergies  Antimicrobials this admission: 1/19 cefepime >> 1/19 vancomycin >>  Microbiology results: 1/19 peritoneal fluid culture: pending 1/19 BCx: pending 1/19 resp. Panel: pending  Thank you for allowing pharmacy to be a part of this patient's care.  Benn Moulder, PharmD Pharmacy Resident  03/05/2020 4:29 PM

## 2020-03-05 NOTE — ED Provider Notes (Signed)
[facility] Emergency Department Provider Note  ____________________________________________   Event Date/Time   First MD Initiated Contact with Patient 03/05/20 1431     (approximate)  I have reviewed the triage vital signs and the nursing notes.   HISTORY  Chief Complaint Abdominal Pain    HPI Jeffrey Maynard is a 35 y.o. male  With h/o ARF 2/2 HTN, on PD, but now with recovering renal function, here with abd pain. Pt reports that for the past 3 days, he's had worsening sharp, stabbing, constant abd pain. It began around his PD catheter but has now spread throughout his abdomen. He's had associated nausea, abd distension, and constipation. He has been vomiting today as well. No known fevers. Pain is worse w/ any movement, palpation. No alleviating factors. No diarrhea. Of note, he does not use his PD cath anymore as he no longer requires HD.       Past Medical History:  Diagnosis Date  . Hypertension     Patient Active Problem List   Diagnosis Date Noted  . Acute peritonitis ([place]) 03/05/2020  . Essential hypertension 03/05/2020  . Ileus ([place]) 03/05/2020  . Acute renal failure (ARF) ([place]) 05/21/2019  . Hypertensive emergency 05/21/2019  . Hypokalemia 05/21/2019  . Thrombocytopenia ([place]) 05/21/2019  . Elevated troponin 05/21/2019    Past Surgical History:  Procedure Laterality Date  . CAPD INSERTION N/A 05/24/2019   Procedure: LAPAROSCOPIC INSERTION CONTINUOUS AMBULATORY PERITONEAL DIALYSIS  (CAPD) CATHETER;  Surgeon: Algernon Huxley, MD;  Location: [facility] ORS;  Service: General;  Laterality: N/A;    Prior to Admission medications   Medication Sig Start Date End Date Taking? Authorizing Provider  amLODipine (NORVASC) 10 MG tablet Take 1 tablet (10 mg total) by mouth daily. 05/26/19  Yes Fritzi Mandes, MD  carvedilol (COREG) 25 MG tablet Take 1 tablet (25 mg total) by mouth 2 (two) times daily with a meal. 05/25/19  Yes Fritzi Mandes, MD  furosemide  (LASIX) 20 MG tablet Take 20 mg by mouth daily. 01/02/20  Yes [provider]  irbesartan (AVAPRO) 150 MG tablet Take 150 mg by mouth every evening. 01/16/20  Yes [provider]  montelukast (SINGULAIR) 10 MG tablet Take 10 mg by mouth daily. 05/17/19  Yes [provider]  traMADol (ULTRAM) 50 MG tablet Take 1 tablet (50 mg total) by mouth every 12 (twelve) hours as needed for severe pain. 05/25/19  Yes Fritzi Mandes, MD    Allergies Patient has no known allergies.  Family History  Problem Relation Age of Onset  . COPD Mother     Social History Social History   Tobacco Use  . Smoking status: Never Smoker  . Smokeless tobacco: Never Used  Substance Use Topics  . Alcohol use: No  . Drug use: Never    Review of Systems  Review of Systems  Constitutional: Positive for fatigue. Negative for chills and fever.  HENT: Negative for sore throat.   Respiratory: Negative for shortness of breath.   Cardiovascular: Negative for chest pain.  Gastrointestinal: Positive for abdominal distention, abdominal pain, constipation, nausea and vomiting.  Genitourinary: Negative for flank pain.  Musculoskeletal: Negative for neck pain.  Skin: Negative for rash and wound.  Allergic/Immunologic: Negative for immunocompromised state.  Neurological: Negative for weakness and numbness.  Hematological: Does not bruise/bleed easily.  All other systems reviewed and are negative.    ____________________________________________  PHYSICAL EXAM:      VITAL SIGNS: ED Triage Vitals  Enc Vitals  Group     BP 03/05/20 1221 127/75     Pulse Rate 03/05/20 1221 67     Resp 03/05/20 1221 18     Temp 03/05/20 1221 98.3 F (36.8 C)     Temp Source 03/05/20 1221 Oral     SpO2 03/05/20 1221 95 %     Weight 03/05/20 1222 168 lb (76.2 kg)     Height 03/05/20 1222 '5\' 8"'$  (1.727 m)     Head Circumference --      Peak Flow --      Pain Score 03/05/20 1227 10     Pain Loc --      Pain Edu? --       Excl. in [place]? --      Physical Exam Vitals and nursing note reviewed.  Constitutional:      General: He is not in acute distress.    Appearance: He is well-developed.  HENT:     Head: Normocephalic and atraumatic.  Eyes:     Conjunctiva/sclera: Conjunctivae normal.  Cardiovascular:     Rate and Rhythm: Normal rate and regular rhythm.     Heart sounds: Normal heart sounds. No murmur heard. No friction rub.  Pulmonary:     Effort: Pulmonary effort is normal. No respiratory distress.     Breath sounds: Normal breath sounds. No wheezing or rales.  Abdominal:     General: There is no distension.     Palpations: Abdomen is soft.     Tenderness: There is generalized abdominal tenderness. There is guarding and rebound.  Musculoskeletal:     Cervical back: Neck supple.  Skin:    General: Skin is warm.     Capillary Refill: Capillary refill takes less than 2 seconds.  Neurological:     Mental Status: He is alert and oriented to person, place, and time.     Motor: No abnormal muscle tone.       ____________________________________________   LABS (all labs ordered are listed, but only abnormal results are displayed)  Labs Reviewed  CBC - Abnormal; Notable for the following components:      Result Value   RBC 4.15 (*)    HCT 38.9 (*)    All other components within normal limits  URINALYSIS, COMPLETE (UACMP) WITH MICROSCOPIC - Abnormal; Notable for the following components:   Color, Urine YELLOW (*)    APPearance HAZY (*)    Hgb urine dipstick SMALL (*)    Ketones, ur 5 (*)    Protein, ur 100 (*)    Bacteria, UA RARE (*)    All other components within normal limits  BODY FLUID CULTURE  CULTURE, BLOOD (ROUTINE X 2)  CULTURE, BLOOD (ROUTINE X 2)  RESP PANEL BY RT-PCR (FLU A&B, COVID) ARPGX2  LIPASE, BLOOD  COMPREHENSIVE METABOLIC PANEL  BODY FLUID CELL COUNT WITH DIFFERENTIAL  PROTEIN, PLEURAL OR PERITONEAL FLUID  ALBUMIN, PLEURAL OR PERITONEAL FLUID     ____________________________________________  EKG:  ________________________________________  RADIOLOGY All imaging, including plain films, CT scans, and ultrasounds, independently reviewed by me, and interpretations confirmed via formal radiology reads.  ED MD interpretation:   CT A/P: Dilatation of small bowel c/f early pSBO vs ileus, though concomitant peritoneal thickening suggests possible peritonitis  Official radiology report(s): CT ABDOMEN PELVIS W CONTRAST  Result Date: 03/05/2020 CLINICAL DATA:  35 year old male with history of generalized abdominal pain with nausea and vomiting. Suspected bowel obstruction. EXAM: CT ABDOMEN AND PELVIS WITH CONTRAST TECHNIQUE: Multidetector CT imaging of  the abdomen and pelvis was performed using the standard protocol following bolus administration of intravenous contrast. CONTRAST:  130m OMNIPAQUE IOHEXOL 300 MG/ML  SOLN COMPARISON:  CT the abdomen and pelvis 07/31/2014. FINDINGS: Lower chest: Unremarkable. Hepatobiliary: No suspicious cystic or solid hepatic lesions. No intra or extrahepatic biliary ductal dilatation. Gallbladder is normal in appearance. Pancreas: No pancreatic mass. No pancreatic ductal dilatation. No pancreatic or peripancreatic fluid collections or inflammatory changes. Spleen: Unremarkable. Adrenals/Urinary Tract: Bilateral kidneys and adrenal glands are normal in appearance. No hydroureteronephrosis. Urinary bladder is nearly completely decompressed, but otherwise unremarkable in appearance. Stomach/Bowel: The appearance of the stomach is normal. Proximal small bowel is decompressed. The mid small bowel is dilated with several air-fluid levels, measuring up to 4.6 cm in diameter. Distal small bowel is also completely decompressed. Small amount of gas and stool noted throughout the colon and rectum. Appendix is normal in diameter, without surrounding inflammatory changes. In the tip of the appendix is a small 4 mm appendicoliths.  Vascular/Lymphatic: No significant atherosclerotic disease, aneurysm or dissection noted in the abdominal or pelvic vasculature. No lymphadenopathy noted in the abdomen or pelvis. Reproductive: Prostate gland and seminal vesicles are unremarkable in appearance. Other: Tenckhoff dialysis catheter with tip in the right lower quadrant. Small volume of fluid in the low anatomic pelvis, presumably peritoneal dialysate. There does appear to be some thickening and mild enhancement of peritoneal membranes, which may suggest peritonitis. Slight haziness is also noted in the small bowel mesentery, most evident associated with the dilated loops of small bowel. No pneumoperitoneum. Musculoskeletal: There are no aggressive appearing lytic or blastic lesions noted in the visualized portions of the skeleton. IMPRESSION: 1. Dilatation of mid small bowel loops, which could be indicative of early or partial small bowel obstruction. However, given the presence of some peritoneal thickening and enhancement and haziness in the fat of the small bowel mesentery associated with these dilated loops of bowel, the possibility of peritonitis and regional small bowel ileus also warrants consideration. 2. Additional incidental findings, as above. Electronically Signed   By: DVinnie LangtonM.D.   On: 03/05/2020 13:28    ____________________________________________  PROCEDURES   Procedure(s) performed (including Critical Care):  Procedures  ____________________________________________  INITIAL IMPRESSION / MDM / AMcLain/ ED COURSE  As part of my medical decision making, I reviewed the following data within the [place] reviewed and incorporated, Old chart reviewed, Notes from prior ED visits, and [place] Controlled Substance [place] evaluated in Emergency Department on 03/05/2020 for the symptoms described in the history of present illness. He was  evaluated in the context of the global COVID-19 pandemic, which necessitated consideration that the patient might be at risk for infection with the SARS-CoV-2 virus that causes COVID-19. Institutional protocols and algorithms that pertain to the evaluation of patients at risk for COVID-19 are in a state of rapid change based on information released by regulatory bodies including the CDC and federal and state organizations. These policies and algorithms were followed during the patient's care in the ED.  Some ED evaluations and interventions may be delayed as a result of limited staffing during the pandemic.*     Medical Decision Making:  35yo M here with abd pain, distension. Clinically, suspect bacterial peritonitis 2/2 his PD catheter, with subsequent reactive ileus. He is afebrile, non-toxic w/o signs of sepsis. CT imaging reviewed, shows ielus vs pSBO with signs of peritonitis,  which fits clinically. D/w Dr. Holley Raring who will notify dialysis nurses to come draw a sample. BCx sent. Labs otherwise reassuring - renalf unction is normal, CBC without leukocytosis. Will plan to admit for likely IV ABX once cultures obtained, and eval for possible PD cath removal.  ____________________________________________  FINAL CLINICAL IMPRESSION(S) / ED DIAGNOSES  Final diagnoses:  None     MEDICATIONS GIVEN DURING THIS VISIT:  Medications  amLODipine (NORVASC) tablet 10 mg (has no administration in time range)  carvedilol (COREG) tablet 25 mg (has no administration in time range)  furosemide (LASIX) tablet 20 mg (has no administration in time range)  irbesartan (AVAPRO) tablet 150 mg (has no administration in time range)  montelukast (SINGULAIR) tablet 10 mg (has no administration in time range)  enoxaparin (LOVENOX) injection 40 mg (has no administration in time range)  sodium chloride flush (NS) 0.9 % injection 3 mL (has no administration in time range)  0.45 % sodium chloride infusion (has no  administration in time range)  morphine 2 MG/ML injection 2 mg (has no administration in time range)  ondansetron (ZOFRAN) tablet 4 mg (has no administration in time range)    Or  ondansetron (ZOFRAN) injection 4 mg (has no administration in time range)  ceFEPIme (MAXIPIME) 2 g in sodium chloride 0.9 % 100 mL IVPB (has no administration in time range)  vancomycin (VANCOREADY) IVPB 2000 mg/400 mL (has no administration in time range)  vancomycin (VANCOREADY) IVPB 750 mg/150 mL (has no administration in time range)  iohexol (OMNIPAQUE) 300 MG/ML solution 100 mL (100 mLs Intravenous Contrast Given 03/05/20 1300)  HYDROmorphone (DILAUDID) injection 0.5 mg (0.5 mg Intravenous Given 03/05/20 1543)  ondansetron (ZOFRAN) injection 4 mg (4 mg Intravenous Given 03/05/20 1543)  0.9 %  sodium chloride infusion ( Intravenous New Bag/Given 03/05/20 1544)     ED Discharge Orders    None       Note:  This document was prepared using Dragon voice recognition software and may include unintentional dictation errors.   Duffy Bruce, MD 03/05/20 [phone]

## 2020-03-05 NOTE — Progress Notes (Signed)
Dialysis nurse present to ED 37 to obtain PD fluid to be sent to laboratory for cultures and cell count with diff. RN unable to obtain any PD fluid at this time with patient stating, " I have not used my catheter in about a month because I regained my kidney function." RN noticed some old blood tinged drainage within the catheter at this time. Patient also states, " My catheter was clog with fibrin at one time and the PD RN at the clinic administered something to declot the catheter but it continues to keep clogging up, I'm suppose to have surgery today to have it removed." Dr. Holley Raring made aware of all stated above and inability to obtain PD fluid as ordered, MD states understanding and no new orders obtained at this time.

## 2020-03-05 NOTE — ED Notes (Signed)
Dialysis nurse unable to obtain peritoneal fluid cultures from peritoneal catheter. Pt reports peritoneal catheter has not been used x1 month. MD Agbata notified.

## 2020-03-05 NOTE — H&P (Signed)
History and Physical    Jeffrey Maynard [id number] DOB: Nov 09, 1985 DOA: 03/05/2020  PCP: Pcp, No   Patient coming from: Home  I have personally briefly reviewed patient's old medical records in [place]  Chief Complaint: Abdominal pain  HPI: Jeffrey Maynard is a 35 y.o. male with medical history significant for hypertension, end-stage renal disease on peritoneal dialysis for about 8 months with improvement in his renal function and patient has been off dialysis for over a month who presents to the emergency room for evaluation of abdominal pain mostly at the site of his peritoneal dialysis catheter.  Patient has had pain intermittently for a couple of weeks but got worse about 4 days ago.  He has been seen in the emergency room twice without any resolution of his symptoms.  Patient states that over the last 4 days he has had diffuse, persistent abdominal pain which he rates an 8 x 10 in intensity at its worst.  Pain is nonradiating and is mostly localized to the site of insertion of his PD catheter.  Abdominal pain is associated with nausea and vomiting as well as constipation.  He has not had a bowel movement in 4 days.  He denies having any fever or chills. According to him and his girlfriend he was scheduled to have an appointment on the day of his admission to determine when the PD catheter could be removed. He denies having any fever or chills, no cough, no diarrhea, no urinary frequency, no nocturia or dysuria, no dizziness, no lightheadedness, no palpitations, no diaphoresis, no headache, no cough. Labs show sodium 140, potassium 3.6, chloride 102, bicarb 26, glucose 87, BUN 12, creatinine 1.07, calcium 9.6, alkaline phosphatase 62, albumin 4.5, lipase 26, AST 20, ALT 12, total protein 7.8, white count 7.3, hemoglobin 13.7, hematocrit 38.9, MCV 93.7, RDW 11.5, platelet count 234 Respiratory viral panel is pending CT scan of abdomen and pelvis shows dilatation of mid small  bowel loops, which could be indicative of early or partial small bowel obstruction. However, given the presence of some peritoneal thickening and enhancement and haziness in the fat of the small bowel mesentery associated with these dilated loops of bowel, the possibility of peritonitis and regional small bowel ileus also warrants consideration.  ED Course: Patient is a 35 year old African-American male who presents for evaluation of abdominal pain mostly around the site of insertion of his PD catheter.  Imaging is suggestive of possible peritonitis with reactive small bowel ileus.  Patient will be admitted to the hospital and started on IV antibiotic therapy.  Review of Systems: As per HPI otherwise all systems reviewed and negative.    Past Medical History:  Diagnosis Date  . Hypertension     Past Surgical History:  Procedure Laterality Date  . CAPD INSERTION N/A 05/24/2019   Procedure: LAPAROSCOPIC INSERTION CONTINUOUS AMBULATORY PERITONEAL DIALYSIS  (CAPD) CATHETER;  Surgeon: Algernon Huxley, MD;  Location: [facility] ORS;  Service: General;  Laterality: N/A;     reports that he has never smoked. He has never used smokeless tobacco. He reports that he does not drink alcohol and does not use drugs.  No Known Allergies  Family History  Problem Relation Age of Onset  . COPD Mother      Prior to Admission medications   Medication Sig Start Date End Date Taking? Authorizing Provider  amLODipine (NORVASC) 10 MG tablet Take 1 tablet (10 mg total) by mouth daily. 05/26/19  Yes Fritzi Mandes, MD  carvedilol (  COREG) 25 MG tablet Take 1 tablet (25 mg total) by mouth 2 (two) times daily with a meal. 05/25/19  Yes Fritzi Mandes, MD  furosemide (LASIX) 20 MG tablet Take 20 mg by mouth daily. 01/02/20  Yes [provider]  irbesartan (AVAPRO) 150 MG tablet Take 150 mg by mouth every evening. 01/16/20  Yes [provider]  montelukast (SINGULAIR) 10 MG tablet Take 10 mg by mouth daily. 05/17/19   Yes [provider]  traMADol (ULTRAM) 50 MG tablet Take 1 tablet (50 mg total) by mouth every 12 (twelve) hours as needed for severe pain. 05/25/19  Yes Fritzi Mandes, MD    Physical Exam: Vitals:   03/05/20 1221 03/05/20 1222  BP: 127/75   Pulse: 67   Resp: 18   Temp: 98.3 F (36.8 C)   TempSrc: Oral   SpO2: 95%   Weight:  76.2 kg  Height:  '5\' 8"'$  (1.727 m)     Vitals:   03/05/20 1221 03/05/20 1222  BP: 127/75   Pulse: 67   Resp: 18   Temp: 98.3 F (36.8 C)   TempSrc: Oral   SpO2: 95%   Weight:  76.2 kg  Height:  '5\' 8"'$  (1.727 m)    Constitutional: NAD, alert and oriented x 3 Eyes: PERRL, lids and conjunctivae normal ENMT: Mucous membranes are dry.  Neck: normal, supple, no masses, no thyromegaly Respiratory: clear to auscultation bilaterally, no wheezing, no crackles. Normal respiratory effort. No accessory muscle use.  Cardiovascular: Regular rate and rhythm, no murmurs / rubs / gallops. No extremity edema. 2+ pedal pulses. No carotid bruits.  Abdomen: Diffusely tender, no masses palpated. No hepatosplenomegaly. Bowel sounds positive.  PD catheter in the left lower quadrant Musculoskeletal: no clubbing / cyanosis. No joint deformity upper and lower extremities.  Skin: no rashes, lesions, ulcers.  Neurologic: No gross focal neurologic deficit. Psychiatric: Normal mood and affect.   Labs on Admission: I have personally reviewed following labs and imaging studies  CBC: Recent Labs  Lab 03/05/20 1236  WBC 7.3  HGB 13.7  HCT 38.9*  MCV 93.7  PLT [id number]   Basic Metabolic Panel: Recent Labs  Lab 03/05/20 1236  NA 140  K 3.6  CL 102  CO2 26  GLUCOSE 87  BUN 12  CREATININE 1.07  CALCIUM 9.6   GFR: Estimated Creatinine Clearance: 95 mL/min (by C-G formula based on SCr of 1.07 mg/dL). Liver Function Tests: Recent Labs  Lab 03/05/20 1236  AST 20  ALT 12  ALKPHOS 62  BILITOT 0.9  PROT 7.8  ALBUMIN 4.5   Recent Labs  Lab 03/05/20 1236   LIPASE 26   No results for input(s): AMMONIA in the last 168 hours. Coagulation Profile: No results for input(s): INR, PROTIME in the last 168 hours. Cardiac Enzymes: No results for input(s): CKTOTAL, CKMB, CKMBINDEX, TROPONINI in the last 168 hours. BNP (last 3 results) No results for input(s): PROBNP in the last 8760 hours. HbA1C: No results for input(s): HGBA1C in the last 72 hours. CBG: No results for input(s): GLUCAP in the last 168 hours. Lipid Profile: No results for input(s): CHOL, HDL, LDLCALC, TRIG, CHOLHDL, LDLDIRECT in the last 72 hours. Thyroid Function Tests: No results for input(s): TSH, T4TOTAL, FREET4, T3FREE, THYROIDAB in the last 72 hours. Anemia Panel: No results for input(s): VITAMINB12, FOLATE, FERRITIN, TIBC, IRON, RETICCTPCT in the last 72 hours. Urine analysis:    Component Value Date/Time   COLORURINE YELLOW (A) 03/05/2020 1229   APPEARANCEUR  HAZY (A) 03/05/2020 1229   LABSPEC 1.029 03/05/2020 1229   PHURINE 5.0 03/05/2020 1229   GLUCOSEU NEGATIVE 03/05/2020 1229   HGBUR SMALL (A) 03/05/2020 [place] 03/05/2020 1229   KETONESUR 5 (A) 03/05/2020 1229   PROTEINUR 100 (A) 03/05/2020 1229   NITRITE NEGATIVE 03/05/2020 1229   LEUKOCYTESUR NEGATIVE 03/05/2020 1229    Radiological Exams on Admission: CT ABDOMEN PELVIS W CONTRAST  Result Date: 03/05/2020 CLINICAL DATA:  35 year old male with history of generalized abdominal pain with nausea and vomiting. Suspected bowel obstruction. EXAM: CT ABDOMEN AND PELVIS WITH CONTRAST TECHNIQUE: Multidetector CT imaging of the abdomen and pelvis was performed using the standard protocol following bolus administration of intravenous contrast. CONTRAST:  138m OMNIPAQUE IOHEXOL 300 MG/ML  SOLN COMPARISON:  CT the abdomen and pelvis 07/31/2014. FINDINGS: Lower chest: Unremarkable. Hepatobiliary: No suspicious cystic or solid hepatic lesions. No intra or extrahepatic biliary ductal dilatation. Gallbladder  is normal in appearance. Pancreas: No pancreatic mass. No pancreatic ductal dilatation. No pancreatic or peripancreatic fluid collections or inflammatory changes. Spleen: Unremarkable. Adrenals/Urinary Tract: Bilateral kidneys and adrenal glands are normal in appearance. No hydroureteronephrosis. Urinary bladder is nearly completely decompressed, but otherwise unremarkable in appearance. Stomach/Bowel: The appearance of the stomach is normal. Proximal small bowel is decompressed. The mid small bowel is dilated with several air-fluid levels, measuring up to 4.6 cm in diameter. Distal small bowel is also completely decompressed. Small amount of gas and stool noted throughout the colon and rectum. Appendix is normal in diameter, without surrounding inflammatory changes. In the tip of the appendix is a small 4 mm appendicoliths. Vascular/Lymphatic: No significant atherosclerotic disease, aneurysm or dissection noted in the abdominal or pelvic vasculature. No lymphadenopathy noted in the abdomen or pelvis. Reproductive: Prostate gland and seminal vesicles are unremarkable in appearance. Other: Tenckhoff dialysis catheter with tip in the right lower quadrant. Small volume of fluid in the low anatomic pelvis, presumably peritoneal dialysate. There does appear to be some thickening and mild enhancement of peritoneal membranes, which may suggest peritonitis. Slight haziness is also noted in the small bowel mesentery, most evident associated with the dilated loops of small bowel. No pneumoperitoneum. Musculoskeletal: There are no aggressive appearing lytic or blastic lesions noted in the visualized portions of the skeleton. IMPRESSION: 1. Dilatation of mid small bowel loops, which could be indicative of early or partial small bowel obstruction. However, given the presence of some peritoneal thickening and enhancement and haziness in the fat of the small bowel mesentery associated with these dilated loops of bowel, the  possibility of peritonitis and regional small bowel ileus also warrants consideration. 2. Additional incidental findings, as above. Electronically Signed   By: DVinnie LangtonM.D.   On: 03/05/2020 13:28    EKG: Independently reviewed.   Assessment/Plan Principal Problem:   Acute peritonitis ([place] Active Problems:   Essential hypertension   Ileus ([facility])     Acute peritonitis Secondary to possible infection of peritoneal dialysis catheter Patient had acute kidney injury requiring renal replacement therapy for which a PD catheter was placed for peritoneal dialysis He was on peritoneal dialysis for about 8 months and has not had any further renal replacement therapy in the last 1 month due to improvement in his renal function. [facility] patient empirically on vancomycin and cefepime Morphine for pain control Follow-up results of PD catheter aspirate Request nephrology consult Will need PD catheter removed    Ileus Most likely  reactive and secondary to peritonitis We will keep patient n.p.o. for now IV fluid hydration Antiemetics   Hypertension Blood pressure is well controlled Continue carvedilol, Avapro and amlodipine  DVT prophylaxis: Lovenox Code Status: Full code Family Communication: Greater than  50% of time was spent discussing plan of care with patient and his wife at the bedside. All questions and concerns have been addressed. They verbalize understanding and agree with the plan Disposition Plan: Back to previous home environment Consults called: Nephrology    Collier Bullock MD [facility]     03/05/2020, 4:42 PM

## 2020-03-05 NOTE — ED Triage Notes (Signed)
Pt comes into the ED via POV c/o generalized Abd pain with N/V.  Pt was seen on 02/08/20 and DG abd showed possible enteritis, ileus, or SBO.  Pt still having pain with no resolution.  Pt states the last BM was Sunday.  Pt explains the pain as sharp pains.  Pt has even and unlabored respirations at this time.

## 2020-03-06 DIAGNOSIS — R1013 Epigastric pain: Secondary | ICD-10-CM

## 2020-03-06 DIAGNOSIS — T85691A Other mechanical complication of intraperitoneal dialysis catheter, initial encounter: Secondary | ICD-10-CM

## 2020-03-06 LAB — BASIC METABOLIC PANEL
Anion gap: 11 (ref 5–15)
BUN: 35 mg/dL — ABNORMAL HIGH (ref 6–20)
CO2: 20 mmol/L — ABNORMAL LOW (ref 22–32)
Calcium: 8.7 mg/dL — ABNORMAL LOW (ref 8.9–10.3)
Chloride: 106 mmol/L (ref 98–111)
Creatinine, Ser: 2.94 mg/dL — ABNORMAL HIGH (ref 0.61–1.24)
GFR, Estimated: 28 mL/min — ABNORMAL LOW (ref >60)
Glucose, Bld: 95 mg/dL (ref 70–99)
Potassium: 4 mmol/L (ref 3.5–5.1)
Sodium: 137 mmol/L (ref 135–145)

## 2020-03-06 LAB — CBC
HCT: 31 % — ABNORMAL LOW (ref 39.0–52.0)
Hemoglobin: 10.3 g/dL — ABNORMAL LOW (ref 13.0–17.0)
MCH: 30.2 pg (ref 26.0–34.0)
MCHC: 33.2 g/dL (ref 30.0–36.0)
MCV: 90.9 fL (ref 80.0–100.0)
Platelets: 270 10*3/uL (ref 150–400)
RBC: 3.41 MIL/uL — ABNORMAL LOW (ref 4.22–5.81)
RDW: 12.3 % (ref 11.5–15.5)
WBC: 6.1 10*3/uL (ref 4.0–10.5)
nRBC: 0 % (ref 0.0–0.2)

## 2020-03-06 MED ORDER — SODIUM CHLORIDE 0.9 % IV SOLN
2.0000 g | Freq: Two times a day (BID) | INTRAVENOUS | Status: DC
  Administered 2020-03-06 – 2020-03-11 (×10): 2 g via INTRAVENOUS
  Filled 2020-03-06 (×13): qty 2

## 2020-03-06 MED ORDER — VANCOMYCIN VARIABLE DOSE PER UNSTABLE RENAL FUNCTION (PHARMACIST DOSING): Status: DC

## 2020-03-06 MED ORDER — SENNOSIDES-DOCUSATE SODIUM 8.6-50 MG PO TABS
2.0000 | ORAL_TABLET | Freq: Two times a day (BID) | ORAL | Status: DC
  Administered 2020-03-06 – 2020-03-11 (×9): 2 via ORAL
  Filled 2020-03-06 (×11): qty 2

## 2020-03-06 MED ORDER — LACTULOSE 10 GM/15ML PO SOLN
20.0000 g | Freq: Once | ORAL | Status: AC
  Administered 2020-03-06: 20 g via ORAL
  Filled 2020-03-06: qty 30

## 2020-03-06 MED ORDER — ACETAMINOPHEN 325 MG PO TABS: 650.0000 mg | ORAL_TABLET | Freq: Four times a day (QID) | ORAL | Status: DC | PRN

## 2020-03-06 NOTE — Consult Note (Signed)
Pharmacy Antibiotic Note  Jeffrey Maynard is a 35 y.o. male admitted on 03/05/2020 with peritonitis. Pt presented with generalized abdominal pain, nausea, and vomiting. Pt states LBM Sunday. Pt was seen in ED with similar complaints of lower abdominal pain and rectal pain on 12/28. Pharmacy has been consulted for vancomycin and cefepime dosing.  12/24 abdominal Xray: Multiple mildly dilated loops of small bowel with air-fluid levels, could reflect enteritis, ileus, or partial small bowel obstruction.  1/19 Abdominal CT: Dilatation of mid small bowel loops, which could be indicative of early or partial small bowel obstruction. However, given the presence of some peritoneal thickening and enhancement and haziness in the fat of the small bowel mesentery associated with these dilated loops of bowel, the possibility of peritonitis and regional small bowel ileus also warrants consideration.  1/20 Patient with history of ESRD on PD. Has not used PD catheter in ~ 1 month due to improvement of renal function. Had planned appointment for possible removal of catheter on date of admission. Unable to collect fluid from PD catheter. Plan to remove PD catheter during this admission. Will continue with IV route of administration for antibiotics and dose antibiotics according to non-PD renal function guidelines.     WBC WNL, afebrile  Scr 1.07 > 2.94  Plan:  Due to rise in serum creatinine, will discontinue scheduled regimen and transition to dosing by levels  Given total doses given (3000 mg), and calculated Vd of 55 and half-life of ~ 21 hrs based on current renal function, estimate next random level due ~ 36 hours (1/21 ~ 1700)   Will monitor renal function with AM labs  Plan for random level 1/21 (not ordered) evening unless change in renal function   Height: '5\' 8"'$  (172.7 cm) Weight: 76.2 kg (168 lb) IBW/kg (Calculated) : 68.4  Temp (24hrs), Avg:99.1 F (37.3 C), Min:98.8 F (37.1 C), Max:99.4  F (37.4 C)  Recent Labs  Lab 03/05/20 1236 03/06/20 0407 03/06/20 0715  WBC 7.3  --  6.1  CREATININE 1.07 2.94*  --     Estimated Creatinine Clearance: 34.6 mL/min (A) (by C-G formula based on SCr of 2.94 mg/dL (H)).    No Known Allergies  Antimicrobials this admission: 1/19 cefepime >> 1/19 vancomycin >>  Microbiology results: 1/19 peritoneal fluid culture: pending 1/19 BCx: NGTD 1/19 resp. Panel: pending  Thank you for allowing pharmacy to be a part of this patient's care.  Dorothe Pea, PharmD, BCPS Clinical Pharmacist  03/06/2020 3:36 PM

## 2020-03-06 NOTE — Progress Notes (Signed)
[facility]  ROUNDING NOTE   Subjective:  Patient well-known to [place] from the outpatient setting as we follow him for chronic kidney disease stage IV. Baseline creatinine 2.9 with an EGFR of 28. Was previously on dialysis in the form of peritoneal dialysis. However treatments held. Patient now presents with lower abdominal pain. We attempted to instill fluid to draw cultures however catheter could not be flushed. Subsequently we have consulted with surgery.   Objective:  Vital signs in last 24 hours:  Temp:  [98.8 F (37.1 C)-99.4 F (37.4 C)] 99.4 F (37.4 C) (01/20 0857) Pulse Rate:  [56-75] 56 (01/20 0857) Resp:  [15-18] 18 (01/20 0857) BP: (119-137)/(65-77) 128/65 (01/20 0857) SpO2:  [94 %-99 %] 99 % (01/20 0857)  Weight change:  Filed Weights   03/05/20 1222  Weight: 76.2 kg    Intake/Output: I/O last 3 completed shifts: In: 455.4 [IV Piggyback:455.4] Out: -    Intake/Output this shift:  Total I/O In: 1497.3 [I.V.:1147.3; IV Piggyback:350] Out: 850 [Urine:850]  Physical Exam: General:  No acute distress  Head:  Normocephalic, atraumatic. Moist oral mucosal membranes  Eyes:  Anicteric  Neck:  Supple  Lungs:   Clear to auscultation, normal effort  Heart:  S1S2 no rubs  Abdomen:   Soft, mild lower abdominal tenderness.  PD catheter in place.  No drainage.  Extremities:  No peripheral edema.  Neurologic:  Awake, alert, following commands  Skin:  No lesions  Access:  PD catheter in place.  No drainage.    Basic Metabolic Panel: Recent Labs  Lab 03/05/20 1236 03/06/20 0407  NA 140 137  K 3.6 4.0  CL 102 106  CO2 26 20*  GLUCOSE 87 95  BUN 12 35*  CREATININE 1.07 2.94*  CALCIUM 9.6 8.7*    Liver Function Tests: Recent Labs  Lab 03/05/20 1236  AST 20  ALT 12  ALKPHOS 62  BILITOT 0.9  PROT 7.8  ALBUMIN 4.5   Recent Labs  Lab 03/05/20 1236  LIPASE 26   No results for input(s): AMMONIA in the last 168 hours.  CBC: Recent  Labs  Lab 03/05/20 1236 03/06/20 0715  WBC 7.3 6.1  HGB 13.7 10.3*  HCT 38.9* 31.0*  MCV 93.7 90.9  PLT 234 270    Cardiac Enzymes: No results for input(s): CKTOTAL, CKMB, CKMBINDEX, TROPONINI in the last 168 hours.  BNP: Invalid input(s): POCBNP  CBG: No results for input(s): GLUCAP in the last 168 hours.  Microbiology: Results for orders placed or performed during the hospital encounter of 03/05/20  Blood culture (routine x 2)     Status: None (Preliminary result)   Collection Time: 03/05/20  3:45 PM   Specimen: BLOOD  Result Value Ref Range Status   Specimen Description BLOOD RIGHT ANTECUBITAL  Final   Special Requests   Final    BOTTLES DRAWN AEROBIC AND ANAEROBIC Blood Culture adequate volume   Culture   Final    NO GROWTH < 24 HOURS Performed at [facility], [address], [place], [place] [address]    Report Status PENDING  Incomplete  Blood culture (routine x 2)     Status: None (Preliminary result)   Collection Time: 03/05/20  3:45 PM   Specimen: BLOOD  Result Value Ref Range Status   Specimen Description BLOOD LEFT ANTECUBITAL  Final   Special Requests   Final    BOTTLES DRAWN AEROBIC AND ANAEROBIC Blood Culture adequate volume   Culture   Final  NO GROWTH < 24 HOURS Performed at [facility], [address] [place], [place] [address]    Report Status PENDING  Incomplete  Resp Panel by RT-PCR (Flu A&B, Covid) Nasopharyngeal Swab     Status: None   Collection Time: 03/05/20  3:45 PM   Specimen: Nasopharyngeal Swab; Nasopharyngeal(NP) swabs in vial transport medium  Result Value Ref Range Status   SARS Coronavirus 2 by RT PCR NEGATIVE NEGATIVE Final    Comment: (NOTE) SARS-CoV-2 target nucleic acids are NOT DETECTED.  The SARS-CoV-2 RNA is generally detectable in upper respiratory specimens during the acute phase of infection. The lowest concentration of SARS-CoV-2 viral copies this assay can detect is 138 copies/mL. A negative  result does not preclude SARS-Cov-2 infection and should not be used as the sole basis for treatment or other patient management decisions. A negative result may occur with  improper specimen collection/handling, submission of specimen other than nasopharyngeal swab, presence of viral mutation(s) within the areas targeted by this assay, and inadequate number of viral copies(<138 copies/mL). A negative result must be combined with clinical observations, patient history, and epidemiological information. The expected result is Negative.  Fact Sheet for Patients:  EntrepreneurPulse.com.au  Fact Sheet for Healthcare Providers:  IncredibleEmployment.be  This test is no t yet approved or cleared by the [place] FDA and  has been authorized for detection and/or diagnosis of SARS-CoV-2 by FDA under an Emergency Use Authorization (EUA). This EUA will remain  in effect (meaning this test can be used) for the duration of the COVID-19 declaration under Section 564(b)(1) of the Act, 21 U.S.C.section 360bbb-3(b)(1), unless the authorization is terminated  or revoked sooner.       Influenza A by PCR NEGATIVE NEGATIVE Final   Influenza B by PCR NEGATIVE NEGATIVE Final    Comment: (NOTE) The Xpert Xpress SARS-CoV-2/FLU/RSV plus assay is intended as an aid in the diagnosis of influenza from Nasopharyngeal swab specimens and should not be used as a sole basis for treatment. Nasal washings and aspirates are unacceptable for Xpert Xpress SARS-CoV-2/FLU/RSV testing.  Fact Sheet for Patients: EntrepreneurPulse.com.au  Fact Sheet for Healthcare Providers: IncredibleEmployment.be  This test is not yet approved or cleared by the [place] FDA and has been authorized for detection and/or diagnosis of SARS-CoV-2 by FDA under an Emergency Use Authorization (EUA). This EUA will remain in effect (meaning this test can be used)  for the duration of the COVID-19 declaration under Section 564(b)(1) of the Act, 21 U.S.C. section 360bbb-3(b)(1), unless the authorization is terminated or revoked.  Performed at [facility], [address] [place], [place] [address]     Coagulation Studies: No results for input(s): LABPROT, INR in the last 72 hours.  Urinalysis: Recent Labs    03/05/20 1229  COLORURINE YELLOW*  LABSPEC 1.029  PHURINE 5.0  GLUCOSEU NEGATIVE  HGBUR SMALL*  BILIRUBINUR NEGATIVE  KETONESUR 5*  PROTEINUR 100*  NITRITE NEGATIVE  LEUKOCYTESUR NEGATIVE      Imaging: CT ABDOMEN PELVIS W CONTRAST  Result Date: 03/05/2020 CLINICAL DATA:  35 year old male with history of generalized abdominal pain with nausea and vomiting. Suspected bowel obstruction. EXAM: CT ABDOMEN AND PELVIS WITH CONTRAST TECHNIQUE: Multidetector CT imaging of the abdomen and pelvis was performed using the standard protocol following bolus administration of intravenous contrast. CONTRAST:  117m OMNIPAQUE IOHEXOL 300 MG/ML  SOLN COMPARISON:  CT the abdomen and pelvis 07/31/2014. FINDINGS: Lower chest: Unremarkable. Hepatobiliary: No suspicious cystic or solid hepatic lesions. No intra or extrahepatic  biliary ductal dilatation. Gallbladder is normal in appearance. Pancreas: No pancreatic mass. No pancreatic ductal dilatation. No pancreatic or peripancreatic fluid collections or inflammatory changes. Spleen: Unremarkable. Adrenals/Urinary Tract: Bilateral kidneys and adrenal glands are normal in appearance. No hydroureteronephrosis. Urinary bladder is nearly completely decompressed, but otherwise unremarkable in appearance. Stomach/Bowel: The appearance of the stomach is normal. Proximal small bowel is decompressed. The mid small bowel is dilated with several air-fluid levels, measuring up to 4.6 cm in diameter. Distal small bowel is also completely decompressed. Small amount of gas and stool noted throughout the colon and  rectum. Appendix is normal in diameter, without surrounding inflammatory changes. In the tip of the appendix is a small 4 mm appendicoliths. Vascular/Lymphatic: No significant atherosclerotic disease, aneurysm or dissection noted in the abdominal or pelvic vasculature. No lymphadenopathy noted in the abdomen or pelvis. Reproductive: Prostate gland and seminal vesicles are unremarkable in appearance. Other: Tenckhoff dialysis catheter with tip in the right lower quadrant. Small volume of fluid in the low anatomic pelvis, presumably peritoneal dialysate. There does appear to be some thickening and mild enhancement of peritoneal membranes, which may suggest peritonitis. Slight haziness is also noted in the small bowel mesentery, most evident associated with the dilated loops of small bowel. No pneumoperitoneum. Musculoskeletal: There are no aggressive appearing lytic or blastic lesions noted in the visualized portions of the skeleton. IMPRESSION: 1. Dilatation of mid small bowel loops, which could be indicative of early or partial small bowel obstruction. However, given the presence of some peritoneal thickening and enhancement and haziness in the fat of the small bowel mesentery associated with these dilated loops of bowel, the possibility of peritonitis and regional small bowel ileus also warrants consideration. 2. Additional incidental findings, as above. Electronically Signed   By: Vinnie Langton M.D.   On: 03/05/2020 13:28     Medications:   . sodium chloride 75 mL/hr at 03/06/20 1044  . ceFEPime (MAXIPIME) IV 2 g (03/06/20 1418)   . amLODipine  10 mg Oral Daily  . carvedilol  25 mg Oral BID WC  . enoxaparin (LOVENOX) injection  40 mg Subcutaneous Q24H  . furosemide  20 mg Oral Daily  . irbesartan  150 mg Oral QPM  . montelukast  10 mg Oral Daily  . senna-docusate  2 tablet Oral BID  . sodium chloride flush  3 mL Intravenous Q12H   acetaminophen, morphine injection, ondansetron **OR**  ondansetron (ZOFRAN) IV  Assessment/ Plan:  35 y.o. male with past medical history of hypertension, prior ESRD with renal recovery now with chronic kidney disease stage IV, anemia of chronic kidney disease, secondary hyperparathyroidism who presents with lower abdominal pain.  1.  Chronic kidney disease stage IIIB/proteinuria.  Patient previously had ESRD but has experienced renal recovery.  PD catheter removal was to be performed in the relative near future.  He was to see surgery as an outpatient tomorrow.  Given ongoing abdominal pain now we are have consulted surgery to see if PD catheter removal can be expedited.  No indication for dialysis at the moment.  2.  Abdominal pain.  Peritonitis remains on the differential.  We attempted to obtain cultures from the peritoneal cavity however PD catheter could not be flushed.  Therefore at this time recommend PD catheter removal which is indicated as he is experienced renal recovery.  Surgery has been consulted for this purpose.  3.  Hypertension.  Continue amlodipine, carvedilol, furosemide, and irbesartan.  4.  Anemia of chronic kidney disease.  Hemoglobin  10.3.  No indication for Procrit at the moment.   LOS: 1 Johnathan Heskett 1/20/20225:54 PM

## 2020-03-06 NOTE — Consult Note (Signed)
Date of Consultation:  03/06/2020  Requesting Physician:  Anthonette Legato, MD  Reason for Consultation:  Non-functioning PD catheter and abdominal pain  History of Present Illness: Jeffrey Maynard is a 35 y.o. male with a history of laparoscopic PD catheter insertion with Dr. Lucky Cowboy on 05/24/19.  Over the past month, the patient reports that the catheter has malfunctioned in that it does not flush anymore.  Furthermore, he has not needed the catheter as he has regained enough renal function to not need dialysis.  Associated with the malfunction, he also reports having abdominal pain in the mid abdomen in a bandlike fashion at the level of the catheter's exit from the skin.  He reports having difficulty with bowel movements and on this admission, he was given lactulose to help with that.  He's had a few BM today and had one just prior to my evaluation.  Denies fevers, chills, chest pain, shortness of breath.  On admission two days ago, he had a CT scan of abdomen/pelvis which showed some dilated loops of small bowel concerning for possible SBO vs ileus, and also some mesenteric edema.  Initially, there was concern for peritonitis and was started on broad spectrum antibiotics.  However, no fluid from the catheter has been able to be obtained for cultures.  He's currently on Cefepime.  Past Medical History: Past Medical History:  Diagnosis Date  . Hypertension      Past Surgical History: Past Surgical History:  Procedure Laterality Date  . CAPD INSERTION N/A 05/24/2019   Procedure: LAPAROSCOPIC INSERTION CONTINUOUS AMBULATORY PERITONEAL DIALYSIS  (CAPD) CATHETER;  Surgeon: Algernon Huxley, MD;  Location: [facility] ORS;  Service: General;  Laterality: N/A;    Home Medications: Prior to Admission medications   Medication Sig Start Date End Date Taking? Authorizing Provider  amLODipine (NORVASC) 10 MG tablet Take 1 tablet (10 mg total) by mouth daily. 05/26/19  Yes Fritzi Mandes, MD  carvedilol (COREG) 25 MG  tablet Take 1 tablet (25 mg total) by mouth 2 (two) times daily with a meal. 05/25/19  Yes Fritzi Mandes, MD  furosemide (LASIX) 20 MG tablet Take 20 mg by mouth daily. 01/02/20  Yes [provider]  irbesartan (AVAPRO) 150 MG tablet Take 150 mg by mouth every evening. 01/16/20  Yes [provider]  montelukast (SINGULAIR) 10 MG tablet Take 10 mg by mouth daily. 05/17/19  Yes [provider]  traMADol (ULTRAM) 50 MG tablet Take 1 tablet (50 mg total) by mouth every 12 (twelve) hours as needed for severe pain. 05/25/19  Yes Fritzi Mandes, MD    Allergies: No Known Allergies  Social History:  reports that he has never smoked. He has never used smokeless tobacco. He reports that he does not drink alcohol and does not use drugs.   Family History: Family History  Problem Relation Age of Onset  . COPD Mother     Review of Systems: Review of Systems  Constitutional: Negative for chills and fever.  HENT: Negative for hearing loss.   Respiratory: Negative for shortness of breath.   Cardiovascular: Negative for chest pain.  Gastrointestinal: Positive for abdominal pain and constipation. Negative for diarrhea, nausea and vomiting.  Genitourinary: Negative for dysuria.  Musculoskeletal: Negative for myalgias.  Skin: Negative for rash.  Neurological: Negative for dizziness.  Psychiatric/Behavioral: Negative for depression.    Physical Exam BP 128/65 (BP Location: Right Arm)   Pulse (!) 56   Temp 99.4 F (37.4 C) (Oral)   Resp 18  Ht '5\' 8"'$  (1.727 m)   Wt 76.2 kg   SpO2 99%   BMI 25.54 kg/m  CONSTITUTIONAL: No acute distress HEENT:  Normocephalic, atraumatic, extraocular motion intact. NECK: Trachea is midline, and there is no jugular venous distension. RESPIRATORY: Normal respiratory effort without pathologic use of accessory muscles. CARDIOVASCULAR: Regular rhythm and rate. GI: The abdomen is soft, mildly distended, with tenderness to palpation in the mid abdomen  at the level of the umbilicus and PD catheter exit.  No peritonitis.  Peritoneal dialysis catheter exiting from the left side of the abdomen with overlying dressing intact.  No evidence of gross infection. MUSCULOSKELETAL:  Normal muscle strength and tone in all four extremities.  No peripheral edema or cyanosis. SKIN: Skin turgor is normal. There are no pathologic skin lesions.  NEUROLOGIC:  Motor and sensation is grossly normal.  Cranial nerves are grossly intact. PSYCH:  Alert and oriented to person, place and time. Affect is normal.  Laboratory Analysis: Results for orders placed or performed during the hospital encounter of 03/05/20 (from the past 24 hour(s))  Basic metabolic panel     Status: Abnormal   Collection Time: 03/06/20  4:07 AM  Result Value Ref Range   Sodium 137 135 - 145 mmol/L   Potassium 4.0 3.5 - 5.1 mmol/L   Chloride 106 98 - 111 mmol/L   CO2 20 (L) 22 - 32 mmol/L   Glucose, Bld 95 70 - 99 mg/dL   BUN 35 (H) 6 - 20 mg/dL   Creatinine, Ser 2.94 (H) 0.61 - 1.24 mg/dL   Calcium 8.7 (L) 8.9 - 10.3 mg/dL   GFR, Estimated 28 (L) >60 mL/min   Anion gap 11 5 - 15  CBC     Status: Abnormal   Collection Time: 03/06/20  7:15 AM  Result Value Ref Range   WBC 6.1 4.0 - 10.5 K/uL   RBC 3.41 (L) 4.22 - 5.81 MIL/uL   Hemoglobin 10.3 (L) 13.0 - 17.0 g/dL   HCT 31.0 (L) 39.0 - 52.0 %   MCV 90.9 80.0 - 100.0 fL   MCH 30.2 26.0 - 34.0 pg   MCHC 33.2 30.0 - 36.0 g/dL   RDW 12.3 11.5 - 15.5 %   Platelets 270 150 - 400 K/uL   nRBC 0.0 0.0 - 0.2 %    Imaging: CT scan abdomen/pelvis 03/05/20: IMPRESSION: 1. Dilatation of mid small bowel loops, which could be indicative of early or partial small bowel obstruction. However, given the presence of some peritoneal thickening and enhancement and haziness in the fat of the small bowel mesentery associated with these dilated loops of bowel, the possibility of peritonitis and regional small bowel ileus also warrants consideration. 2.  Additional incidental findings, as above.  Assessment and Plan: This is a 35 y.o. male with abdominal pain and malfunctioning PD catheter.  --Discussed with the patient that based on his pain and CT scan findings, it could be possible that small bowel loops are trying to wrap around the catheter, causing the mesenteric edema and clogging of the catheter.  His WBC is normal and he is not toxic and would be less concerned about ischemic or necrotic bowel. --Discussed with him that given his pain though and difficulty with bowel movements, I would recommend removal of the PD catheter.  I think he would be a good candidate for robotic approach as a precaution to evaluate the intra-abdominal cavity and perform any lysis of adhesions as needed.  Discussed the possibility of converting  to open surgery if there was need for resection of small bowel.  Reviewed the risks of bleeding, infection, injury to surrounding structures, and he's willing to proceed. --Will add him to the OR schedule for tomorrow afternoon.    Face-to-face time spent with the patient and care providers was 80 minutes, with more than 50% of the time spent counseling, educating, and coordinating care of the patient.     Melvyn Neth, MD [facility] Pg:  [phone]

## 2020-03-06 NOTE — Progress Notes (Signed)
PROGRESS NOTE    Jeffrey Maynard  [id number] DOB: 1986-01-04 DOA: 03/05/2020 PCP: Pcp, No   Chief complaint.  Abdominal pain. Brief Narrative:  Jeffrey Maynard is a 34 y.o. male with medical history significant for hypertension, end-stage renal disease on peritoneal dialysis for about 8 months with improvement in his renal function and patient has been off dialysis for over a month who presents to the emergency room for evaluation of abdominal pain mostly at the site of his peritoneal dialysis catheter.  Patient is diagnosed with acute peritonitis.  Placed on vancomycin and cefepime.  Nephrology consult obtained.   Assessment & Plan:   Principal Problem:   Acute peritonitis ([place]) Active Problems:   Essential hypertension   Ileus ([place])  #1.  Acute peritonitis. Pending blood cultures.  Nephrology could not drain any fluid from the PD catheter.  Patient still has abdominal pain, but seem to better. Continue antibiotics with vancomycin and cefepime. Spoke with Dr. Holley Raring, PD catheter will be removed at this admission. I will DC vancomycin as patient renal function is worse today, less concern for MRSA.  #2.  Acute kidney injury. Patient renal function worse today.  Vancomycin discontinued. Continue IV fluids. Followed by nephrology.  3.  Ileus, constipation. Patient has significant constipation, no bowel movement for the last few days.  We will give a dose of lactulose and scheduled senna.   DVT prophylaxis: Lovenox Code Status: Full Family Communication:  Disposition Plan:  .   Status is: Inpatient  Remains inpatient appropriate because:Inpatient level of care appropriate due to severity of illness   Dispo: The patient is from: Home              Anticipated d/c is to: Home              Anticipated d/c date is: 2 days              Patient currently is not medically stable to d/c.        I/O last 3 completed shifts: In: 455.4 [IV Piggyback:455.4] Out: -   Total I/O In: 250 [IV Piggyback:250] Out: 38 [Urine:600]     Consultants:   Nephrology  Procedures: None  Antimicrobials: Cefepime  Subjective: Patient still complaining of significant abdominal pain, no nausea vomiting.  Constipated, no bowel movement for the last few days. Denies any short of breath or cough. No fever or chills. No dysuria or hematuria.  Objective: Vitals:   03/05/20 2031 03/06/20 0153 03/06/20 0803 03/06/20 0857  BP: 137/75 121/77 119/74 128/65  Pulse: 75 67 74 (!) 56  Resp: '17 15 16 18  '$ Temp:   98.8 F (37.1 C) 99.4 F (37.4 C)  TempSrc:   Oral Oral  SpO2: 94% 96% 97% 99%  Weight:      Height:        Intake/Output Summary (Last 24 hours) at 03/06/2020 1118 Last data filed at 03/06/2020 0804 Gross per 24 hour  Intake 705.41 ml  Output 600 ml  Net 105.41 ml   Filed Weights   03/05/20 1222  Weight: 76.2 kg    Examination:  General exam: Appears calm and comfortable  Respiratory system: Clear to auscultation. Respiratory effort normal. Cardiovascular system: S1 & S2 heard, RRR. No JVD, murmurs, rubs, gallops or clicks. No pedal edema. Gastrointestinal system: Abdomen is distended, soft and tender.  Also has rebound tenderness. Central nervous system: Alert and oriented. No focal neurological deficits. Extremities: Symmetric  Skin: No rashes, lesions or ulcers  Psychiatry: Judgement and insight appear normal. Mood & affect appropriate.     Data Reviewed: I have personally reviewed following labs and imaging studies  CBC: Recent Labs  Lab 03/05/20 1236 03/06/20 0715  WBC 7.3 6.1  HGB 13.7 10.3*  HCT 38.9* 31.0*  MCV 93.7 90.9  PLT 234 [id number]   Basic Metabolic Panel: Recent Labs  Lab 03/05/20 1236 03/06/20 0407  NA 140 137  K 3.6 4.0  CL 102 106  CO2 26 20*  GLUCOSE 87 95  BUN 12 35*  CREATININE 1.07 2.94*  CALCIUM 9.6 8.7*   GFR: Estimated Creatinine Clearance: 34.6 mL/min (A) (by C-G formula based on SCr of 2.94  mg/dL (H)). Liver Function Tests: Recent Labs  Lab 03/05/20 1236  AST 20  ALT 12  ALKPHOS 62  BILITOT 0.9  PROT 7.8  ALBUMIN 4.5   Recent Labs  Lab 03/05/20 1236  LIPASE 26   No results for input(s): AMMONIA in the last 168 hours. Coagulation Profile: No results for input(s): INR, PROTIME in the last 168 hours. Cardiac Enzymes: No results for input(s): CKTOTAL, CKMB, CKMBINDEX, TROPONINI in the last 168 hours. BNP (last 3 results) No results for input(s): PROBNP in the last 8760 hours. HbA1C: No results for input(s): HGBA1C in the last 72 hours. CBG: No results for input(s): GLUCAP in the last 168 hours. Lipid Profile: No results for input(s): CHOL, HDL, LDLCALC, TRIG, CHOLHDL, LDLDIRECT in the last 72 hours. Thyroid Function Tests: No results for input(s): TSH, T4TOTAL, FREET4, T3FREE, THYROIDAB in the last 72 hours. Anemia Panel: No results for input(s): VITAMINB12, FOLATE, FERRITIN, TIBC, IRON, RETICCTPCT in the last 72 hours. Sepsis Labs: No results for input(s): PROCALCITON, LATICACIDVEN in the last 168 hours.  Recent Results (from the past 240 hour(s))  Blood culture (routine x 2)     Status: None (Preliminary result)   Collection Time: 03/05/20  3:45 PM   Specimen: BLOOD  Result Value Ref Range Status   Specimen Description BLOOD RIGHT ANTECUBITAL  Final   Special Requests   Final    BOTTLES DRAWN AEROBIC AND ANAEROBIC Blood Culture adequate volume   Culture   Final    NO GROWTH < 24 HOURS Performed at [facility], [address], [place], [place] [address]    Report Status PENDING  Incomplete  Blood culture (routine x 2)     Status: None (Preliminary result)   Collection Time: 03/05/20  3:45 PM   Specimen: BLOOD  Result Value Ref Range Status   Specimen Description BLOOD LEFT ANTECUBITAL  Final   Special Requests   Final    BOTTLES DRAWN AEROBIC AND ANAEROBIC Blood Culture adequate volume   Culture   Final    NO GROWTH < 24 HOURS Performed  at [facility], [address], [place], [place] [address]    Report Status PENDING  Incomplete  Resp Panel by RT-PCR (Flu A&B, Covid) Nasopharyngeal Swab     Status: None   Collection Time: 03/05/20  3:45 PM   Specimen: Nasopharyngeal Swab; Nasopharyngeal(NP) swabs in vial transport medium  Result Value Ref Range Status   SARS Coronavirus 2 by RT PCR NEGATIVE NEGATIVE Final    Comment: (NOTE) SARS-CoV-2 target nucleic acids are NOT DETECTED.  The SARS-CoV-2 RNA is generally detectable in upper respiratory specimens during the acute phase of infection. The lowest concentration of SARS-CoV-2 viral copies this assay can detect is 138 copies/mL. A negative result does not preclude SARS-Cov-2 infection and should  not be used as the sole basis for treatment or other patient management decisions. A negative result may occur with  improper specimen collection/handling, submission of specimen other than nasopharyngeal swab, presence of viral mutation(s) within the areas targeted by this assay, and inadequate number of viral copies(<138 copies/mL). A negative result must be combined with clinical observations, patient history, and epidemiological information. The expected result is Negative.  Fact Sheet for Patients:  EntrepreneurPulse.com.au  Fact Sheet for Healthcare Providers:  IncredibleEmployment.be  This test is no t yet approved or cleared by the [place] FDA and  has been authorized for detection and/or diagnosis of SARS-CoV-2 by FDA under an Emergency Use Authorization (EUA). This EUA will remain  in effect (meaning this test can be used) for the duration of the COVID-19 declaration under Section 564(b)(1) of the Act, 21 U.S.C.section 360bbb-3(b)(1), unless the authorization is terminated  or revoked sooner.       Influenza A by PCR NEGATIVE NEGATIVE Final   Influenza B by PCR NEGATIVE NEGATIVE Final    Comment: (NOTE) The  Xpert Xpress SARS-CoV-2/FLU/RSV plus assay is intended as an aid in the diagnosis of influenza from Nasopharyngeal swab specimens and should not be used as a sole basis for treatment. Nasal washings and aspirates are unacceptable for Xpert Xpress SARS-CoV-2/FLU/RSV testing.  Fact Sheet for Patients: EntrepreneurPulse.com.au  Fact Sheet for Healthcare Providers: IncredibleEmployment.be  This test is not yet approved or cleared by the [place] FDA and has been authorized for detection and/or diagnosis of SARS-CoV-2 by FDA under an Emergency Use Authorization (EUA). This EUA will remain in effect (meaning this test can be used) for the duration of the COVID-19 declaration under Section 564(b)(1) of the Act, 21 U.S.C. section 360bbb-3(b)(1), unless the authorization is terminated or revoked.  Performed at [facility], [address], [place], [place] [address]          Radiology Studies: CT ABDOMEN PELVIS W CONTRAST  Result Date: 03/05/2020 CLINICAL DATA:  35 year old male with history of generalized abdominal pain with nausea and vomiting. Suspected bowel obstruction. EXAM: CT ABDOMEN AND PELVIS WITH CONTRAST TECHNIQUE: Multidetector CT imaging of the abdomen and pelvis was performed using the standard protocol following bolus administration of intravenous contrast. CONTRAST:  164m OMNIPAQUE IOHEXOL 300 MG/ML  SOLN COMPARISON:  CT the abdomen and pelvis 07/31/2014. FINDINGS: Lower chest: Unremarkable. Hepatobiliary: No suspicious cystic or solid hepatic lesions. No intra or extrahepatic biliary ductal dilatation. Gallbladder is normal in appearance. Pancreas: No pancreatic mass. No pancreatic ductal dilatation. No pancreatic or peripancreatic fluid collections or inflammatory changes. Spleen: Unremarkable. Adrenals/Urinary Tract: Bilateral kidneys and adrenal glands are normal in appearance. No hydroureteronephrosis. Urinary bladder is  nearly completely decompressed, but otherwise unremarkable in appearance. Stomach/Bowel: The appearance of the stomach is normal. Proximal small bowel is decompressed. The mid small bowel is dilated with several air-fluid levels, measuring up to 4.6 cm in diameter. Distal small bowel is also completely decompressed. Small amount of gas and stool noted throughout the colon and rectum. Appendix is normal in diameter, without surrounding inflammatory changes. In the tip of the appendix is a small 4 mm appendicoliths. Vascular/Lymphatic: No significant atherosclerotic disease, aneurysm or dissection noted in the abdominal or pelvic vasculature. No lymphadenopathy noted in the abdomen or pelvis. Reproductive: Prostate gland and seminal vesicles are unremarkable in appearance. Other: Tenckhoff dialysis catheter with tip in the right lower quadrant. Small volume of fluid in the low anatomic pelvis, presumably peritoneal dialysate. There does appear to  be some thickening and mild enhancement of peritoneal membranes, which may suggest peritonitis. Slight haziness is also noted in the small bowel mesentery, most evident associated with the dilated loops of small bowel. No pneumoperitoneum. Musculoskeletal: There are no aggressive appearing lytic or blastic lesions noted in the visualized portions of the skeleton. IMPRESSION: 1. Dilatation of mid small bowel loops, which could be indicative of early or partial small bowel obstruction. However, given the presence of some peritoneal thickening and enhancement and haziness in the fat of the small bowel mesentery associated with these dilated loops of bowel, the possibility of peritonitis and regional small bowel ileus also warrants consideration. 2. Additional incidental findings, as above. Electronically Signed   By: Vinnie Langton M.D.   On: 03/05/2020 13:28        Scheduled Meds: . amLODipine  10 mg Oral Daily  . carvedilol  25 mg Oral BID WC  . enoxaparin  (LOVENOX) injection  40 mg Subcutaneous Q24H  . furosemide  20 mg Oral Daily  . irbesartan  150 mg Oral QPM  . montelukast  10 mg Oral Daily  . sodium chloride flush  3 mL Intravenous Q12H  . vancomycin variable dose per unstable renal function (pharmacist dosing)   Does not apply See admin instructions   Continuous Infusions: . sodium chloride 75 mL/hr at 03/06/20 1044  . ceFEPime (MAXIPIME) IV       LOS: 1 day    Time spent: 27 minutes    Sharen Hones, MD [facility]   To contact the attending provider between 7A-7P or the covering provider during after hours 7P-7A, please log into the web site [email or web address] and access using universal Cache password for that web site. If you do not have the password, please call the hospital operator.  03/06/2020, 11:18 AM

## 2020-03-07 ENCOUNTER — Encounter: Payer: Self-pay | Admitting: Internal Medicine

## 2020-03-07 ENCOUNTER — Inpatient Hospital Stay: Payer: 59 | Admitting: Anesthesiology

## 2020-03-07 ENCOUNTER — Encounter
Admission: EM | Disposition: A | Discharge status: Home / Self Care | Payer: Self-pay | Source: Home / Self Care | Attending: Family Medicine

## 2020-03-07 ENCOUNTER — Ambulatory Visit: Payer: 59 | Admitting: Surgery

## 2020-03-07 DIAGNOSIS — T8571XA Infection and inflammatory reaction due to peritoneal dialysis catheter, initial encounter: Principal | ICD-10-CM

## 2020-03-07 DIAGNOSIS — R109 Unspecified abdominal pain: Secondary | ICD-10-CM

## 2020-03-07 DIAGNOSIS — T85611A Breakdown (mechanical) of intraperitoneal dialysis catheter, initial encounter: Secondary | ICD-10-CM

## 2020-03-07 DIAGNOSIS — N184 Chronic kidney disease, stage 4 (severe): Secondary | ICD-10-CM

## 2020-03-07 DIAGNOSIS — B9561 Methicillin susceptible Staphylococcus aureus infection as the cause of diseases classified elsewhere: Secondary | ICD-10-CM

## 2020-03-07 DIAGNOSIS — K56600 Partial intestinal obstruction, unspecified as to cause: Secondary | ICD-10-CM

## 2020-03-07 DIAGNOSIS — N179 Acute kidney failure, unspecified: Secondary | ICD-10-CM

## 2020-03-07 DIAGNOSIS — R1033 Periumbilical pain: Secondary | ICD-10-CM

## 2020-03-07 DIAGNOSIS — I129 Hypertensive chronic kidney disease with stage 1 through stage 4 chronic kidney disease, or unspecified chronic kidney disease: Secondary | ICD-10-CM

## 2020-03-07 HISTORY — PX: XI ROBOT ASSISTED DIAGNOSTIC LAPAROSCOPY: SHX6815

## 2020-03-07 LAB — CBC WITH DIFFERENTIAL/PLATELET
Abs Immature Granulocytes: 0.02 10*3/uL (ref 0.00–0.07)
Basophils Absolute: 0 10*3/uL (ref 0.0–0.1)
Basophils Relative: 1 %
Eosinophils Absolute: 0.2 10*3/uL (ref 0.0–0.5)
Eosinophils Relative: 3 %
HCT: 28 % — ABNORMAL LOW (ref 39.0–52.0)
Hemoglobin: 9.1 g/dL — ABNORMAL LOW (ref 13.0–17.0)
Immature Granulocytes: 0 %
Lymphocytes Relative: 26 %
Lymphs Abs: 1.3 10*3/uL (ref 0.7–4.0)
MCH: 29.9 pg (ref 26.0–34.0)
MCHC: 32.5 g/dL (ref 30.0–36.0)
MCV: 92.1 fL (ref 80.0–100.0)
Monocytes Absolute: 0.6 10*3/uL (ref 0.1–1.0)
Monocytes Relative: 12 %
Neutro Abs: 2.9 10*3/uL (ref 1.7–7.7)
Neutrophils Relative %: 58 %
Platelets: 250 10*3/uL (ref 150–400)
RBC: 3.04 MIL/uL — ABNORMAL LOW (ref 4.22–5.81)
RDW: 12.4 % (ref 11.5–15.5)
WBC: 5.1 10*3/uL (ref 4.0–10.5)
nRBC: 0 % (ref 0.0–0.2)

## 2020-03-07 LAB — BASIC METABOLIC PANEL
Anion gap: 13 (ref 5–15)
BUN: 39 mg/dL — ABNORMAL HIGH (ref 6–20)
CO2: 18 mmol/L — ABNORMAL LOW (ref 22–32)
Calcium: 8.9 mg/dL (ref 8.9–10.3)
Chloride: 106 mmol/L (ref 98–111)
Creatinine, Ser: 3.14 mg/dL — ABNORMAL HIGH (ref 0.61–1.24)
GFR, Estimated: 26 mL/min — ABNORMAL LOW (ref >60)
Glucose, Bld: 84 mg/dL (ref 70–99)
Potassium: 3.8 mmol/L (ref 3.5–5.1)
Sodium: 137 mmol/L (ref 135–145)

## 2020-03-07 LAB — MAGNESIUM: Magnesium: 2 mg/dL (ref 1.7–2.4)

## 2020-03-07 SURGERY — LAPAROSCOPY, DIAGNOSTIC, ROBOT-ASSISTED
Anesthesia: General

## 2020-03-07 MED ORDER — FENTANYL CITRATE (PF) 100 MCG/2ML IJ SOLN
25.0000 ug | INTRAMUSCULAR | Status: DC | PRN
  Administered 2020-03-07: 25 ug via INTRAVENOUS

## 2020-03-07 MED ORDER — SEVOFLURANE IN SOLN
RESPIRATORY_TRACT | Status: AC
  Filled 2020-03-07: qty 250

## 2020-03-07 MED ORDER — LIDOCAINE HCL (PF) 2 % IJ SOLN
INTRAMUSCULAR | Status: AC
  Filled 2020-03-07: qty 5

## 2020-03-07 MED ORDER — LIDOCAINE HCL (CARDIAC) PF 100 MG/5ML IV SOSY
PREFILLED_SYRINGE | INTRAVENOUS | Status: DC | PRN
  Administered 2020-03-07: 80 mg via INTRAVENOUS

## 2020-03-07 MED ORDER — PROPOFOL 10 MG/ML IV BOLUS
INTRAVENOUS | Status: AC
  Filled 2020-03-07: qty 20

## 2020-03-07 MED ORDER — DEXAMETHASONE SODIUM PHOSPHATE 10 MG/ML IJ SOLN
INTRAMUSCULAR | Status: DC | PRN
  Administered 2020-03-07: 5 mg via INTRAVENOUS

## 2020-03-07 MED ORDER — MIDAZOLAM HCL 2 MG/2ML IJ SOLN
INTRAMUSCULAR | Status: DC | PRN
  Administered 2020-03-07: 2 mg via INTRAVENOUS

## 2020-03-07 MED ORDER — FENTANYL CITRATE (PF) 100 MCG/2ML IJ SOLN
INTRAMUSCULAR | Status: AC
  Filled 2020-03-07: qty 2

## 2020-03-07 MED ORDER — BUPIVACAINE-EPINEPHRINE 0.25% -1:200000 IJ SOLN
INTRAMUSCULAR | Status: DC | PRN
  Administered 2020-03-07: 30 mL

## 2020-03-07 MED ORDER — PROPOFOL 10 MG/ML IV BOLUS
INTRAVENOUS | Status: DC | PRN
  Administered 2020-03-07: 150 mg via INTRAVENOUS

## 2020-03-07 MED ORDER — BUPIVACAINE LIPOSOME 1.3 % IJ SUSP
INTRAMUSCULAR | Status: DC | PRN
  Administered 2020-03-07: 20 mL

## 2020-03-07 MED ORDER — SODIUM CHLORIDE 0.9 % IV SOLN: INTRAVENOUS | Status: DC | PRN

## 2020-03-07 MED ORDER — SUGAMMADEX SODIUM 200 MG/2ML IV SOLN
INTRAVENOUS | Status: DC | PRN
  Administered 2020-03-07: 200 mg via INTRAVENOUS

## 2020-03-07 MED ORDER — MIDAZOLAM HCL 2 MG/2ML IJ SOLN
INTRAMUSCULAR | Status: AC
  Filled 2020-03-07: qty 2

## 2020-03-07 MED ORDER — ACETAMINOPHEN 10 MG/ML IV SOLN
INTRAVENOUS | Status: AC
  Filled 2020-03-07: qty 100

## 2020-03-07 MED ORDER — ONDANSETRON HCL 4 MG/2ML IJ SOLN: 4.0000 mg | Freq: Once | INTRAMUSCULAR | Status: DC | PRN

## 2020-03-07 MED ORDER — ONDANSETRON HCL 4 MG/2ML IJ SOLN
INTRAMUSCULAR | Status: DC | PRN
  Administered 2020-03-07: 4 mg via INTRAVENOUS

## 2020-03-07 MED ORDER — CEFAZOLIN SODIUM-DEXTROSE 2-4 GM/100ML-% IV SOLN
2.0000 g | INTRAVENOUS | Status: AC
  Administered 2020-03-07: 2 g via INTRAVENOUS
  Filled 2020-03-07: qty 100

## 2020-03-07 MED ORDER — SUCCINYLCHOLINE CHLORIDE 20 MG/ML IJ SOLN
INTRAMUSCULAR | Status: DC | PRN
  Administered 2020-03-07: 140 mg via INTRAVENOUS

## 2020-03-07 MED ORDER — DEXMEDETOMIDINE (PRECEDEX) IN NS 20 MCG/5ML (4 MCG/ML) IV SYRINGE
PREFILLED_SYRINGE | INTRAVENOUS | Status: DC | PRN
  Administered 2020-03-07: 8 ug via INTRAVENOUS

## 2020-03-07 MED ORDER — FENTANYL CITRATE (PF) 100 MCG/2ML IJ SOLN
INTRAMUSCULAR | Status: DC | PRN
  Administered 2020-03-07 (×2): 25 ug via INTRAVENOUS
  Administered 2020-03-07: 50 ug via INTRAVENOUS

## 2020-03-07 MED ORDER — ROCURONIUM BROMIDE 100 MG/10ML IV SOLN
INTRAVENOUS | Status: DC | PRN
  Administered 2020-03-07 (×2): 10 mg via INTRAVENOUS
  Administered 2020-03-07: 50 mg via INTRAVENOUS

## 2020-03-07 MED ORDER — GLYCOPYRROLATE 0.2 MG/ML IJ SOLN
INTRAMUSCULAR | Status: AC
  Filled 2020-03-07: qty 1

## 2020-03-07 MED ORDER — GLYCOPYRROLATE 0.2 MG/ML IJ SOLN
INTRAMUSCULAR | Status: DC | PRN
  Administered 2020-03-07: .2 mg via INTRAVENOUS

## 2020-03-07 MED ORDER — PHENYLEPHRINE HCL (PRESSORS) 10 MG/ML IV SOLN
INTRAVENOUS | Status: DC | PRN
  Administered 2020-03-07: 150 ug via INTRAVENOUS
  Administered 2020-03-07 (×2): 100 ug via INTRAVENOUS

## 2020-03-07 MED ORDER — ROCURONIUM BROMIDE 10 MG/ML (PF) SYRINGE
PREFILLED_SYRINGE | INTRAVENOUS | Status: AC
  Filled 2020-03-07: qty 10

## 2020-03-07 MED ORDER — ACETAMINOPHEN 10 MG/ML IV SOLN
INTRAVENOUS | Status: DC | PRN
  Administered 2020-03-07: 1000 mg via INTRAVENOUS

## 2020-03-07 SURGICAL SUPPLY — 67 items
CANISTER SUCT 1200ML W/VALVE (MISCELLANEOUS) ×2 IMPLANT
CANNULA REDUC XI 12-8 STAPL (CANNULA) ×1
CANNULA REDUCER 12-8 DVNC XI (CANNULA) ×1 IMPLANT
CHLORAPREP W/TINT 26 (MISCELLANEOUS) ×2 IMPLANT
CNTNR SPEC 2.5X3XGRAD LEK (MISCELLANEOUS) ×1
CONT SPEC 4OZ STER OR WHT (MISCELLANEOUS) ×1
CONTAINER SPEC 2.5X3XGRAD LEK (MISCELLANEOUS) ×1 IMPLANT
COVER TIP SHEARS 8 DVNC (MISCELLANEOUS) ×1 IMPLANT
COVER TIP SHEARS 8MM DA VINCI (MISCELLANEOUS) ×1
COVER WAND RF STERILE (DRAPES) ×4 IMPLANT
DEFOGGER SCOPE WARMER CLEARIFY (MISCELLANEOUS) ×2 IMPLANT
DERMABOND ADVANCED (GAUZE/BANDAGES/DRESSINGS) ×1
DERMABOND ADVANCED .7 DNX12 (GAUZE/BANDAGES/DRESSINGS) ×1 IMPLANT
DRAPE ARM DVNC X/XI (DISPOSABLE) ×3 IMPLANT
DRAPE COLUMN DVNC XI (DISPOSABLE) ×1 IMPLANT
DRAPE DA VINCI XI ARM (DISPOSABLE) ×3
DRAPE DA VINCI XI COLUMN (DISPOSABLE) ×1
DRSG TEGADERM 4X4.75 (GAUZE/BANDAGES/DRESSINGS) ×2 IMPLANT
ELECT CAUTERY BLADE 6.4 (BLADE) ×2 IMPLANT
ELECT REM PT RETURN 9FT ADLT (ELECTROSURGICAL) ×2
ELECTRODE REM PT RTRN 9FT ADLT (ELECTROSURGICAL) ×1 IMPLANT
GAUZE SPONGE 4X4 12PLY STRL (GAUZE/BANDAGES/DRESSINGS) ×2 IMPLANT
GLOVE SURG ENC MOIS LTX SZ7 (GLOVE) ×4 IMPLANT
GOWN STRL REUS W/ TWL LRG LVL3 (GOWN DISPOSABLE) ×3 IMPLANT
GOWN STRL REUS W/TWL LRG LVL3 (GOWN DISPOSABLE) ×3
IRRIGATION STRYKERFLOW (MISCELLANEOUS) IMPLANT
IRRIGATOR STRYKERFLOW (MISCELLANEOUS)
KIT PINK PAD W/HEAD ARE REST (MISCELLANEOUS) ×2
KIT PINK PAD W/HEAD ARM REST (MISCELLANEOUS) ×1 IMPLANT
LABEL OR SOLS (LABEL) ×2 IMPLANT
MANIFOLD NEPTUNE II (INSTRUMENTS) ×2 IMPLANT
NEEDLE HYPO 22GX1.5 SAFETY (NEEDLE) ×2 IMPLANT
NEEDLE INSUFFLATION 14GA 120MM (NEEDLE) ×2 IMPLANT
OBTURATOR OPTICAL STANDARD 8MM (TROCAR) ×1
OBTURATOR OPTICAL STND 8 DVNC (TROCAR) ×1
OBTURATOR OPTICALSTD 8 DVNC (TROCAR) ×1 IMPLANT
PACK LAP CHOLECYSTECTOMY (MISCELLANEOUS) ×2 IMPLANT
PENCIL ELECTRO HAND CTR (MISCELLANEOUS) ×2 IMPLANT
SEAL CANN UNIV 5-8 DVNC XI (MISCELLANEOUS) ×3 IMPLANT
SEAL XI 5MM-8MM UNIVERSAL (MISCELLANEOUS) ×3
SEALER VESSEL DA VINCI XI (MISCELLANEOUS)
SEALER VESSEL EXT DVNC XI (MISCELLANEOUS) IMPLANT
SET TUBE SMOKE EVAC HIGH FLOW (TUBING) ×2 IMPLANT
SOLUTION ELECTROLUBE (MISCELLANEOUS) ×2 IMPLANT
SPONGE DRAIN TRACH 4X4 STRL 2S (GAUZE/BANDAGES/DRESSINGS) ×2 IMPLANT
SPONGE LAP 18X18 RF (DISPOSABLE) ×2 IMPLANT
SPONGE LAP 4X18 RFD (DISPOSABLE) ×2 IMPLANT
STAPLER CANNULA SEAL DVNC XI (STAPLE) ×1 IMPLANT
STAPLER CANNULA SEAL XI (STAPLE) ×1
SUT ETHILON 3-0 FS-10 30 BLK (SUTURE) ×2
SUT MNCRL 4-0 (SUTURE) ×1
SUT MNCRL 4-0 27XMFL (SUTURE) ×1
SUT V-LOC 90 ABS 3-0 VLT  V-20 (SUTURE) ×1
SUT V-LOC 90 ABS 3-0 VLT V-20 (SUTURE) ×1 IMPLANT
SUT VICRYL 0 AB UR-6 (SUTURE) ×4 IMPLANT
SUT VLOC 180 2-0 9IN GS21 (SUTURE) ×2 IMPLANT
SUT VLOC 90 S/L VL9 GS22 (SUTURE) ×2 IMPLANT
SUTURE EHLN 3-0 FS-10 30 BLK (SUTURE) ×1 IMPLANT
SUTURE MNCRL 4-0 27XMF (SUTURE) ×1 IMPLANT
SWAB CULTURE AMIES ANAERIB BLU (MISCELLANEOUS) ×2 IMPLANT
SYR 20ML LL LF (SYRINGE) ×2 IMPLANT
SYR 30ML LL (SYRINGE) ×2 IMPLANT
TROCAR BALLN GELPORT 12X130M (ENDOMECHANICALS) ×2 IMPLANT
TUBE GASTRO 14F 5C (TUBING) IMPLANT
TUBE JEJUNO 16X14 (TUBING) IMPLANT
TUBE JEJUNO 18X14 (TUBING) IMPLANT
TUBE JEJUNO 22X14 (TUBING) IMPLANT

## 2020-03-07 NOTE — Progress Notes (Signed)
PROGRESS NOTE    RION SLAYDEN  [id number] DOB: April 06, 1985 DOA: 03/05/2020 PCP: Pcp, No   Brief Narrative: This 35 years old male with PMH significant for hypertension, ESRD on peritoneal dialysis for about 8 months with improvement in his renal functions and patient has been off of dialysis for over a month who presents to the emergency room for evaluation of abdominal pain mostly at the site of peritoneal dialysis catheter. Patient had planned appointment for possible removal of catheter on the date of admission.  CT abdomen consistent with small bowel obstruction with acute peritonitis.  Patient was started on vancomycin and cefepime. Nephrology and general surgery consult was obtained.   Vancomycin discontinued.  Patient remained afebrile on cefepime.  General surgery is planning to do robotic assisted laparoscopy and removal of PD catheter today.   Assessment & Plan:   Principal Problem:   Acute peritonitis ([place]) Active Problems:   AKI (acute kidney injury) ([place])   Essential hypertension   Ileus ([facility])   Periumbilical abdominal pain   PD catheter dysfunction ([place])   Acute abdominal pain secondary to acute peritonitis: Patient presented with abdominal pain around the site of peritoneal catheter. CT abdomen consistent with partial small bowel obstruction with possible peritonitis. General surgery consulted, scheduled to have robotic assisted laparoscopy with removal of PD catheter on 01/21. Blood cultures : No growth so far. Nephrology could not drain any fluid from the PD catheter.   Patient reports slight improvement in his abdominal pain. Continue adequate pain medications with morphine. Initiated antibiotics with vancomycin and cefepime.   Nephrology was consulted, agreed with removal of the PD catheter. Vancomycin discontinued, renal functions worsening, MRSA Nares pending.  Acute kidney injury on CKD stage IV: Patient was on peritoneal dialysis for 8  months. Renal functions has improved,  He was scheduled to have removal of PD on the day of admission. He was initiated on vancomycin and cefepime which increased his serum creatinine. Vancomycin discontinued, continue IV hydration.  Ileus, partial small bowel obstruction: We will keep n.p.o., IV fluids. Patient has significant constipation, no bowel movement for the last few days.   Will give a dose of lactulose and scheduled senna. General surgery is planning to do laparoscopic removal of PD catheter today  Hypertension: Continue amlodipine, Coreg and Avapro.    DVT prophylaxis: Lovenox  code Status: Full code Family Communication: No Family at bedside. Disposition Plan:   Status is: Inpatient  Remains inpatient appropriate because:Inpatient level of care appropriate due to severity of illness   Dispo: The patient is from: Home              Anticipated d/c is to: Home              Anticipated d/c date is: 3 days              Patient currently is not medically stable to d/c.   Consultants:   General surgery  Nephrology  Procedures: Scheduled robotic assisted diagnostic laparoscopy and removal of his PD catheter on 01/21.  Antimicrobials:   Anti-infectives (From admission, onward)   Start     Dose/Rate Route Frequency Ordered Stop   03/07/20 0945  [MAR Hold]  ceFAZolin (ANCEF) IVPB 2g/100 mL premix        (MAR Hold since Fri 03/07/2020 at 1040.Hold Reason: Transfer to a Procedural area.)   2 g 200 mL/hr over 30 Minutes Intravenous On call to O.R. 03/07/20 [id number] 03/08/20 0559   03/06/20 1400  [  MAR Hold]  ceFEPIme (MAXIPIME) 2 g in sodium chloride 0.9 % 100 mL IVPB        (MAR Hold since Fri 03/07/2020 at 1040.Hold Reason: Transfer to a Procedural area.)   2 g 200 mL/hr over 30 Minutes Intravenous Every 12 hours 03/06/20 0842     03/06/20 0839  vancomycin variable dose per unstable renal function (pharmacist dosing)  Status:  Discontinued         Does not apply See  admin instructions 03/06/20 0840 03/06/20 1122   03/06/20 0600  vancomycin (VANCOREADY) IVPB 1250 mg/250 mL  Status:  Discontinued        1,250 mg 166.7 mL/hr over 90 Minutes Intravenous Every 12 hours 03/05/20 1657 03/06/20 0840   03/06/20 0100  vancomycin (VANCOREADY) IVPB 750 mg/150 mL  Status:  Discontinued        750 mg 150 mL/hr over 60 Minutes Intravenous Every 8 hours 03/05/20 1627 03/05/20 1655   03/05/20 1800  vancomycin (VANCOREADY) IVPB 1750 mg/350 mL        1,750 mg 175 mL/hr over 120 Minutes Intravenous  Once 03/05/20 1657 03/05/20 2259   03/05/20 1700  ceFEPIme (MAXIPIME) 2 g in sodium chloride 0.9 % 100 mL IVPB  Status:  Discontinued        2 g 200 mL/hr over 30 Minutes Intravenous Every 8 hours 03/05/20 1627 03/06/20 0842   03/05/20 1700  vancomycin (VANCOREADY) IVPB 2000 mg/400 mL  Status:  Discontinued        2,000 mg 200 mL/hr over 120 Minutes Intravenous  Once 03/05/20 1627 03/05/20 1654      Subjective: Patient was seen and examined at bedside.  Overnight events noted.  Patient reports abdominal pain is better.  He is scheduled to have robotic assisted laparoscopy with removal of PD today.  Objective: Vitals:   03/07/20 0026 03/07/20 0445 03/07/20 0741 03/07/20 1031  BP: 106/63 113/65 131/74 128/67  Pulse: (!) 55 (!) 49 70 (!) 51  Resp: '16 16 16 18  '$ Temp: 97.6 F (36.4 C) (!) 97.5 F (36.4 C) 98.1 F (36.7 C) 98.8 F (37.1 C)  TempSrc:    Oral  SpO2: 97% 99% 100% 100%  Weight:    76.7 kg  Height:    '5\' 8"'$  (1.727 m)    Intake/Output Summary (Last 24 hours) at 03/07/2020 1055 Last data filed at 03/07/2020 0755 Gross per 24 hour  Intake 1247.3 ml  Output 800 ml  Net 447.3 ml   Filed Weights   03/05/20 1222 03/07/20 1031  Weight: 76.2 kg 76.7 kg    Examination:  General exam: Appears calm and comfortable , appears in significant pain. Respiratory system: Clear to auscultation. Respiratory effort normal. Cardiovascular system: S1 & S2 heard, RRR.  No JVD, murmurs, rubs, gallops or clicks. No pedal edema. Gastrointestinal system: Abdomen is nondistended, soft and nontender. No organomegaly or masses felt. Normal bowel sounds heard.  PD catheter noted on left side of lower abdomen Central nervous system: Alert and oriented. No focal neurological deficits. Extremities:, No edema, no cyanosis, no clubbing. Skin: No rashes, lesions or ulcers Psychiatry: Judgement and insight appear normal. Mood & affect appropriate.     Data Reviewed: I have personally reviewed following labs and imaging studies  CBC: Recent Labs  Lab 03/05/20 1236 03/06/20 0715 03/07/20 0435  WBC 7.3 6.1 5.1  NEUTROABS  --   --  2.9  HGB 13.7 10.3* 9.1*  HCT 38.9* 31.0* 28.0*  MCV 93.7 90.9 92.1  PLT 234 270 [id number]   Basic Metabolic Panel: Recent Labs  Lab 03/05/20 1236 03/06/20 0407 03/07/20 0435  NA 140 137 137  K 3.6 4.0 3.8  CL 102 106 106  CO2 26 20* 18*  GLUCOSE 87 95 84  BUN 12 35* 39*  CREATININE 1.07 2.94* 3.14*  CALCIUM 9.6 8.7* 8.9  MG  --   --  2.0   GFR: Estimated Creatinine Clearance: 32.4 mL/min (A) (by C-G formula based on SCr of 3.14 mg/dL (H)). Liver Function Tests: Recent Labs  Lab 03/05/20 1236  AST 20  ALT 12  ALKPHOS 62  BILITOT 0.9  PROT 7.8  ALBUMIN 4.5   Recent Labs  Lab 03/05/20 1236  LIPASE 26   No results for input(s): AMMONIA in the last 168 hours. Coagulation Profile: No results for input(s): INR, PROTIME in the last 168 hours. Cardiac Enzymes: No results for input(s): CKTOTAL, CKMB, CKMBINDEX, TROPONINI in the last 168 hours. BNP (last 3 results) No results for input(s): PROBNP in the last 8760 hours. HbA1C: No results for input(s): HGBA1C in the last 72 hours. CBG: No results for input(s): GLUCAP in the last 168 hours. Lipid Profile: No results for input(s): CHOL, HDL, LDLCALC, TRIG, CHOLHDL, LDLDIRECT in the last 72 hours. Thyroid Function Tests: No results for input(s): TSH, T4TOTAL, FREET4,  T3FREE, THYROIDAB in the last 72 hours. Anemia Panel: No results for input(s): VITAMINB12, FOLATE, FERRITIN, TIBC, IRON, RETICCTPCT in the last 72 hours. Sepsis Labs: No results for input(s): PROCALCITON, LATICACIDVEN in the last 168 hours.  Recent Results (from the past 240 hour(s))  Blood culture (routine x 2)     Status: None (Preliminary result)   Collection Time: 03/05/20  3:45 PM   Specimen: BLOOD  Result Value Ref Range Status   Specimen Description BLOOD RIGHT ANTECUBITAL  Final   Special Requests   Final    BOTTLES DRAWN AEROBIC AND ANAEROBIC Blood Culture adequate volume   Culture   Final    NO GROWTH 2 DAYS Performed at [facility], [address] [place], [place] [address]    Report Status PENDING  Incomplete  Blood culture (routine x 2)     Status: None (Preliminary result)   Collection Time: 03/05/20  3:45 PM   Specimen: BLOOD  Result Value Ref Range Status   Specimen Description BLOOD LEFT ANTECUBITAL  Final   Special Requests   Final    BOTTLES DRAWN AEROBIC AND ANAEROBIC Blood Culture adequate volume   Culture   Final    NO GROWTH 2 DAYS Performed at [facility], [address] [place], [place] [address]    Report Status PENDING  Incomplete  Resp Panel by RT-PCR (Flu A&B, Covid) Nasopharyngeal Swab     Status: None   Collection Time: 03/05/20  3:45 PM   Specimen: Nasopharyngeal Swab; Nasopharyngeal(NP) swabs in vial transport medium  Result Value Ref Range Status   SARS Coronavirus 2 by RT PCR NEGATIVE NEGATIVE Final    Comment: (NOTE) SARS-CoV-2 target nucleic acids are NOT DETECTED.  The SARS-CoV-2 RNA is generally detectable in upper respiratory specimens during the acute phase of infection. The lowest concentration of SARS-CoV-2 viral copies this assay can detect is 138 copies/mL. A negative result does not preclude SARS-Cov-2 infection and should not be used as the sole basis for treatment or other patient management decisions.  A negative result may occur with  improper specimen collection/handling, submission of specimen other than nasopharyngeal swab, presence of viral  mutation(s) within the areas targeted by this assay, and inadequate number of viral copies(<138 copies/mL). A negative result must be combined with clinical observations, patient history, and epidemiological information. The expected result is Negative.  Fact Sheet for Patients:  EntrepreneurPulse.com.au  Fact Sheet for Healthcare Providers:  IncredibleEmployment.be  This test is no t yet approved or cleared by the [place] FDA and  has been authorized for detection and/or diagnosis of SARS-CoV-2 by FDA under an Emergency Use Authorization (EUA). This EUA will remain  in effect (meaning this test can be used) for the duration of the COVID-19 declaration under Section 564(b)(1) of the Act, 21 U.S.C.section 360bbb-3(b)(1), unless the authorization is terminated  or revoked sooner.       Influenza A by PCR NEGATIVE NEGATIVE Final   Influenza B by PCR NEGATIVE NEGATIVE Final    Comment: (NOTE) The Xpert Xpress SARS-CoV-2/FLU/RSV plus assay is intended as an aid in the diagnosis of influenza from Nasopharyngeal swab specimens and should not be used as a sole basis for treatment. Nasal washings and aspirates are unacceptable for Xpert Xpress SARS-CoV-2/FLU/RSV testing.  Fact Sheet for Patients: EntrepreneurPulse.com.au  Fact Sheet for Healthcare Providers: IncredibleEmployment.be  This test is not yet approved or cleared by the [place] FDA and has been authorized for detection and/or diagnosis of SARS-CoV-2 by FDA under an Emergency Use Authorization (EUA). This EUA will remain in effect (meaning this test can be used) for the duration of the COVID-19 declaration under Section 564(b)(1) of the Act, 21 U.S.C. section 360bbb-3(b)(1), unless the authorization  is terminated or revoked.  Performed at [facility], [address] [place], [place] [address]     Radiology Studies: CT ABDOMEN PELVIS W CONTRAST  Result Date: 03/05/2020 CLINICAL DATA:  35 year old male with history of generalized abdominal pain with nausea and vomiting. Suspected bowel obstruction. EXAM: CT ABDOMEN AND PELVIS WITH CONTRAST TECHNIQUE: Multidetector CT imaging of the abdomen and pelvis was performed using the standard protocol following bolus administration of intravenous contrast. CONTRAST:  119m OMNIPAQUE IOHEXOL 300 MG/ML  SOLN COMPARISON:  CT the abdomen and pelvis 07/31/2014. FINDINGS: Lower chest: Unremarkable. Hepatobiliary: No suspicious cystic or solid hepatic lesions. No intra or extrahepatic biliary ductal dilatation. Gallbladder is normal in appearance. Pancreas: No pancreatic mass. No pancreatic ductal dilatation. No pancreatic or peripancreatic fluid collections or inflammatory changes. Spleen: Unremarkable. Adrenals/Urinary Tract: Bilateral kidneys and adrenal glands are normal in appearance. No hydroureteronephrosis. Urinary bladder is nearly completely decompressed, but otherwise unremarkable in appearance. Stomach/Bowel: The appearance of the stomach is normal. Proximal small bowel is decompressed. The mid small bowel is dilated with several air-fluid levels, measuring up to 4.6 cm in diameter. Distal small bowel is also completely decompressed. Small amount of gas and stool noted throughout the colon and rectum. Appendix is normal in diameter, without surrounding inflammatory changes. In the tip of the appendix is a small 4 mm appendicoliths. Vascular/Lymphatic: No significant atherosclerotic disease, aneurysm or dissection noted in the abdominal or pelvic vasculature. No lymphadenopathy noted in the abdomen or pelvis. Reproductive: Prostate gland and seminal vesicles are unremarkable in appearance. Other: Tenckhoff dialysis catheter with tip in the right  lower quadrant. Small volume of fluid in the low anatomic pelvis, presumably peritoneal dialysate. There does appear to be some thickening and mild enhancement of peritoneal membranes, which may suggest peritonitis. Slight haziness is also noted in the small bowel mesentery, most evident associated with the dilated loops of small bowel. No pneumoperitoneum. Musculoskeletal: There are no  aggressive appearing lytic or blastic lesions noted in the visualized portions of the skeleton. IMPRESSION: 1. Dilatation of mid small bowel loops, which could be indicative of early or partial small bowel obstruction. However, given the presence of some peritoneal thickening and enhancement and haziness in the fat of the small bowel mesentery associated with these dilated loops of bowel, the possibility of peritonitis and regional small bowel ileus also warrants consideration. 2. Additional incidental findings, as above. Electronically Signed   By: Vinnie Langton M.D.   On: 03/05/2020 13:28   Scheduled Meds: . [MAR Hold] amLODipine  10 mg Oral Daily  . [MAR Hold] carvedilol  25 mg Oral BID WC  . [MAR Hold] enoxaparin (LOVENOX) injection  40 mg Subcutaneous Q24H  . [MAR Hold] furosemide  20 mg Oral Daily  . [MAR Hold] irbesartan  150 mg Oral QPM  . [MAR Hold] montelukast  10 mg Oral Daily  . [MAR Hold] senna-docusate  2 tablet Oral BID  . [MAR Hold] sodium chloride flush  3 mL Intravenous Q12H   Continuous Infusions: . sodium chloride 75 mL/hr at 03/07/20 0113  . [MAR Hold]  ceFAZolin (ANCEF) IV    . [MAR Hold] ceFEPime (MAXIPIME) IV 2 g (03/07/20 0116)     LOS: 2 days    Time spent: 35 mins.    Shawna Clamp, MD [facility]   If 7PM-7AM, please contact night-coverage

## 2020-03-07 NOTE — Progress Notes (Signed)
Preoperative Review   Patient is met in the preoperatively. The history is reviewed in the chart and with the patient. I personally reviewed the options and rationale as well as the risks of this procedure that have been previously discussed with the patient. All questions asked by the patient and/or family were answered to their satisfaction. Procedure d/w pt in detail, risks, benefits and possible complications including but not limited to bleeding, infection, bowel injuries. He understands and agrees to proceed with this procedure at this time.  Caroleen Hamman M.D. FACS

## 2020-03-07 NOTE — Anesthesia Preprocedure Evaluation (Signed)
Anesthesia Evaluation  Patient identified by MRN, date of birth, ID band Patient awake    Reviewed: Allergy & Precautions, H&P , NPO status , reviewed documented beta blocker date and time   Airway Mallampati: II  TM Distance: >3 FB Neck ROM: full    Dental  (+) Teeth Intact   Pulmonary neg pulmonary ROS,    Pulmonary exam normal        Cardiovascular hypertension, Normal cardiovascular exam  Recent ECHO IMPRESSIONS  1. Left ventricular ejection fraction, by estimation, is 55 to 60%. The  left ventricle has normal function. The left ventricle has no regional  wall motion abnormalities. There is moderate left ventricular hypertrophy.  Left ventricular diastolic  parameters are consistent with Grade II diastolic dysfunction  (pseudonormalization).  2. Right ventricular systolic function is normal. The right ventricular  size is normal.  3. Left atrial size was mildly dilated.  4. The mitral valve is normal in structure. Mild mitral valve  regurgitation. No evidence of mitral stenosis.  5. The aortic valve is tricuspid. Aortic valve regurgitation is not  visualized. No aortic stenosis is present.  6. The inferior vena cava is normal in size with greater than 50%  respiratory variability, suggesting right atrial pressure of 3 mmHg.   Hospitalist aware of increased troponin and T wave changes on EKG, both felt 2 to HTN crisis/Renal failure. Cleared to proceed w surgery    Neuro/Psych negative neurological ROS  negative psych ROS   GI/Hepatic Neg liver ROS, neg GERD  ,  Endo/Other  negative endocrine ROS  Renal/GU ARF, CRF and DialysisRenal disease  negative genitourinary   Musculoskeletal negative musculoskeletal ROS (+)   Abdominal   Peds negative pediatric ROS (+)  Hematology negative hematology ROS (+)   Anesthesia Other Findings Past Medical History: No date: Hypertension - severe with demand  ischemia  History reviewed. No pertinent surgical history. BMI    Body Mass Index: 27.62 kg/m     Reproductive/Obstetrics                             Anesthesia Physical  Anesthesia Plan  ASA: III  Anesthesia Plan: General   Post-op Pain Management:    Induction: Intravenous  PONV Risk Score and Plan: 2 and Ondansetron, Treatment may vary due to age or medical condition, Midazolam and Dexamethasone  Airway Management Planned: Oral ETT  Additional Equipment:   Intra-op Plan:   Post-operative Plan: Extubation in OR  Informed Consent: I have reviewed the patients History and Physical, chart, labs and discussed the procedure including the risks, benefits and alternatives for the proposed anesthesia with the patient or authorized representative who has indicated his/her understanding and acceptance.     Dental Advisory Given  Plan Discussed with: CRNA  Anesthesia Plan Comments:         Anesthesia Quick Evaluation

## 2020-03-07 NOTE — Progress Notes (Signed)
Came to see patient today. Currently in the OR to remove PD catheter. We will return tomorrow for follow-up.

## 2020-03-07 NOTE — Consult Note (Signed)
Pharmacy Antibiotic Note  Jeffrey Maynard is a 35 y.o. male admitted on 03/05/2020 with peritonitis. Pt presented with generalized abdominal pain, nausea, and vomiting. Pt states LBM Sunday. Pt was seen in ED with similar complaints of lower abdominal pain and rectal pain on 12/28. Pharmacy has been consulted for vancomycin and cefepime dosing.  12/24 abdominal Xray: Multiple mildly dilated loops of small bowel with air-fluid levels, could reflect enteritis, ileus, or partial small bowel obstruction.  1/19 Abdominal CT: Dilatation of mid small bowel loops, which could be indicative of early or partial small bowel obstruction. However, given the presence of some peritoneal thickening and enhancement and haziness in the fat of the small bowel mesentery associated with these dilated loops of bowel, the possibility of peritonitis and regional small bowel ileus also warrants consideration.  1/20 Patient with history of ESRD on PD. Has not used PD catheter in ~ 1 month due to improvement of renal function. Had planned appointment for possible removal of catheter on date of admission. Unable to collect fluid from PD catheter. Plan to remove PD catheter during this admission. Vancomycin discontinued. WBC WNL, afebrile   Scr 1.07 > 2.94>> 3.14  Plan: Day 3 antibiotics. Continue Cefepime 2g IV every 12 hours   Height: '5\' 8"'$  (172.7 cm) Weight: 76.2 kg (168 lb) IBW/kg (Calculated) : 68.4  Temp (24hrs), Avg:97.8 F (36.6 C), Min:97.5 F (36.4 C), Max:98.1 F (36.7 C)  Recent Labs  Lab 03/05/20 1236 03/06/20 0407 03/06/20 0715 03/07/20 0435  WBC 7.3  --  6.1 5.1  CREATININE 1.07 2.94*  --  3.14*    Estimated Creatinine Clearance: 32.4 mL/min (A) (by C-G formula based on SCr of 3.14 mg/dL (H)).    No Known Allergies  Antimicrobials this admission: 1/19 cefepime >> 1/19 vancomycin >> 1/20   Microbiology results: 1/19 peritoneal fluid culture: pending 1/19 BCx: NGTD 1/19 resp. Panel:  pending  Thank you for allowing pharmacy to be a part of this patient's care.  Pernell Dupre, PharmD, BCPS Clinical Pharmacist  03/07/2020 9:18 AM

## 2020-03-07 NOTE — Op Note (Addendum)
Removal of Peritoneal dialysis catheter Laparoscopic enterolysis   Pre-operative Diagnosis:  Tonight is with PD catheter infection   Post-operative Diagnosis: same     Surgeon: Caroleen Hamman, MD FACS   Anesthesia: Gen. with endotracheal tube      Findings: peritoneal catheter with purulent fluid within its lumen Significant and extensive adhesions from the small bowel to the abdominal wall.   Given that the catheter was causing significant inflammatory response the small bowel was wrapped around the catheter itself.   Estimated Blood Loss: 5cc              Complications: none     Procedure Details  The patient was seen again in the Holding Room. The benefits, complications, treatment options, and expected outcomes were discussed with the patient. The risks of bleeding, infection, recurrence of symptoms, failure to resolve symptoms, catheter malfunction bowel injury, any of which could require further surgery were reviewed with the patient. The likelihood of improving the patient's symptoms with return to their baseline status is good.  The patient and/or family concurred with the proposed plan, giving informed consent.  The patient was taken to Operating Room, identified and the procedure verified. A Time Out was held and the above information confirmed.   Prior to the induction of general anesthesia, antibiotic prophylaxis was administered. VTE prophylaxis was in place. General endotracheal anesthesia was then administered and tolerated well. After the induction, the abdomen was prepped with Chloraprep and draped in the sterile fashion. The patient was positioned in the supine position.   I Identified the exit site of the catheter and perform an incision over one of the coughs.  The cuffs where divided with cautery from adjacent structures on till we were able to free up the subcutaneous portion of the catheter.  Similar technique was used at the exit site.  Once we had freed the  soupiness component then we started with the laparoscopic portion.  Attention was turned to the left upper quadrant and I inserted a varies needle over Palmer's point.  Test was good and were able to obtain appropriate pressures. Pneumoperitoneum was obtained w/o hemodynamic changes. We placed two additional laparoscopic ports under direct visualization.  One of them was done as an Optiview in the left upper quadrant under direct visualization.  There is no evidence of any bowel injuries.  There was no evidence of purulent ascites however there was evidence of a rim of infected peritoneal fluid around the catheter.  Herbie Baltimore was brought to the field and was docked in the standard fashion.  We made sure there was no evidence of external or internal collisions and we inserted all instrumentation under direct visualization.  Significant adhesions from the small bowel to the abdominal wall.  Lysis of adhesions was performed with scissors.  Please note that the lysis of adhesions was the main component of the operation.  Catheter was wrapped around the small bowel.  There was a significant inflammatory response due to the catheter being infected.  We were able to very gently dissect the small bowel from the catheter.  There was no evidence of any erosion into the bowel and there was no evidence of any injuries.  The peritoneal dialysis catheter was removed and was sent for culture.  We will also sent for culture the fluid inside  PD catheter that had gross purulence.  Can look laparoscopic views show no evidence of any bowel injuries.  There was no evidence of any other undrained collections inside the  abdominal cavity. There was evidence of mild dilation of the small bowel consistent with reactive ileus secondary to peritoneal catheter infection  All skin incisions  were infiltrated with a liposomal Marcaine. 4-0 subcuticular Monocryl was used to close the skin. Dermabond was  applied. I decided to leave open the  exit wound site of the PD catheter and where the cuff of the PD catheter was due to infection.  This was to the left side of the abdomen.  Sponge, lap, and needle counts were correct at closure and at the conclusion of the case.  Plan He will need antibiotics given purulent peritonitis. Wet /dry daily to open wounds.              Caroleen Hamman, MD, FACS

## 2020-03-07 NOTE — Anesthesia Procedure Notes (Addendum)
Procedure Name: Intubation Date/Time: 03/07/2020 11:52 AM Performed by: Lily Peer, Tywan Siever, CRNA Pre-anesthesia Checklist: Patient identified, Emergency Drugs available, Suction available and Patient being monitored Patient Re-evaluated:Patient Re-evaluated prior to induction Oxygen Delivery Method: Circle system utilized Preoxygenation: Pre-oxygenation with 100% oxygen Induction Type: IV induction and Rapid sequence Laryngoscope Size: McGraph and 4 Grade View: Grade I Tube type: Oral Tube size: 7.5 mm Number of attempts: 1 Airway Equipment and Method: Stylet Placement Confirmation: ETT inserted through vocal cords under direct vision,  positive ETCO2 and breath sounds checked- equal and bilateral Secured at: 22 cm Tube secured with: Tape Dental Injury: Teeth and Oropharynx as per pre-operative assessment

## 2020-03-07 NOTE — Progress Notes (Signed)
03/07/2020  Subjective: No acute events overnight.  Patient had two more bowel movements after I spoke with him.  However, continues having the same pain in the mid abdomen.  WBC remains normal at 5.1 this morning, but he's on Cefepime.  Vital signs: Temp:  [97.5 F (36.4 C)-99.4 F (37.4 C)] 98.1 F (36.7 C) (01/21 0741) Pulse Rate:  [48-70] 70 (01/21 0741) Resp:  [16-18] 16 (01/21 0741) BP: (106-141)/(63-78) 131/74 (01/21 0741) SpO2:  [97 %-100 %] 100 % (01/21 0741)   Intake/Output: 01/20 0701 - 01/21 0700 In: 1497.3 [I.V.:1147.3; IV Piggyback:350] Out: 1150 [Urine:1150] Last BM Date: 03/06/20  Physical Exam: Constitutional: No acute distress Abdomen:  Soft, mildly distended, with tenderness to palpation in the mid abdomen at the level of the umbilicus.  Labs:  Recent Labs    03/06/20 0715 03/07/20 0435  WBC 6.1 5.1  HGB 10.3* 9.1*  HCT 31.0* 28.0*  PLT 270 250   Recent Labs    03/06/20 0407 03/07/20 0435  NA 137 137  K 4.0 3.8  CL 106 106  CO2 20* 18*  GLUCOSE 95 84  BUN 35* 39*  CREATININE 2.94* 3.14*  CALCIUM 8.7* 8.9   No results for input(s): LABPROT, INR in the last 72 hours.  Imaging: No results found.  Assessment/Plan: This is a 35 y.o. male with malfunctioning PD catheter and abdominal pain.  --Discussed with him again plans for today for robotic assisted diagnostic laparoscopy and then removal of his PD catheter.  Discussed this with Dr. Dahlia Byes who has graciously offered to do this case.  As such, we're able to do his surgery sooner this morning. --Keep NPO with IV fluid hydration and IV antibiotic   Melvyn Neth, [place]

## 2020-03-07 NOTE — Progress Notes (Signed)
Patient off floor for surgical procedure. Consent signed and in chart. Cefazolin sent to OR

## 2020-03-07 NOTE — Plan of Care (Signed)

## 2020-03-07 NOTE — Transfer of Care (Signed)
Immediate Anesthesia Transfer of Care Note  Patient: Jeffrey Maynard  Procedure(s) Performed: XI ROBOT ASSISTED DIAGNOSTIC LAPAROSCOPY-Catheter Removal (N/A )  Patient Location: PACU  Anesthesia Type:General  Level of Consciousness: drowsy  Airway & Oxygen Therapy: Patient Spontanous Breathing and Patient connected to face mask oxygen  Post-op Assessment: Report given to RN and Post -op Vital signs reviewed and stable  Post vital signs: Reviewed and stable  Last Vitals:  Vitals Value Taken Time  BP 110/72   Temp    Pulse 62 03/07/20 1320  Resp 19 03/07/20 1320  SpO2 100 % 03/07/20 1320  Vitals shown include unvalidated device data.  Last Pain:  Vitals:   03/07/20 1031  TempSrc: Oral  PainSc: 0-No pain         Complications: No complications documented.

## 2020-03-08 LAB — CBC
HCT: 27.1 % — ABNORMAL LOW (ref 39.0–52.0)
Hemoglobin: 9.2 g/dL — ABNORMAL LOW (ref 13.0–17.0)
MCH: 30.7 pg (ref 26.0–34.0)
MCHC: 33.9 g/dL (ref 30.0–36.0)
MCV: 90.3 fL (ref 80.0–100.0)
Platelets: 268 10*3/uL (ref 150–400)
RBC: 3 MIL/uL — ABNORMAL LOW (ref 4.22–5.81)
RDW: 12.3 % (ref 11.5–15.5)
WBC: 8.6 10*3/uL (ref 4.0–10.5)
nRBC: 0 % (ref 0.0–0.2)

## 2020-03-08 LAB — MAGNESIUM: Magnesium: 1.8 mg/dL (ref 1.7–2.4)

## 2020-03-08 LAB — BASIC METABOLIC PANEL
Anion gap: 10 (ref 5–15)
BUN: 37 mg/dL — ABNORMAL HIGH (ref 6–20)
CO2: 21 mmol/L — ABNORMAL LOW (ref 22–32)
Calcium: 8.4 mg/dL — ABNORMAL LOW (ref 8.9–10.3)
Chloride: 105 mmol/L (ref 98–111)
Creatinine, Ser: 3.07 mg/dL — ABNORMAL HIGH (ref 0.61–1.24)
GFR, Estimated: 27 mL/min — ABNORMAL LOW (ref >60)
Glucose, Bld: 120 mg/dL — ABNORMAL HIGH (ref 70–99)
Potassium: 3.9 mmol/L (ref 3.5–5.1)
Sodium: 136 mmol/L (ref 135–145)

## 2020-03-08 LAB — GRAM STAIN

## 2020-03-08 LAB — PHOSPHORUS: Phosphorus: 2.5 mg/dL (ref 2.5–4.6)

## 2020-03-08 MED ORDER — BISACODYL 10 MG RE SUPP
10.0000 mg | Freq: Once | RECTAL | Status: AC
  Administered 2020-03-08: 10 mg via RECTAL
  Filled 2020-03-08: qty 1

## 2020-03-08 MED ORDER — DOCUSATE SODIUM 100 MG PO CAPS
100.0000 mg | ORAL_CAPSULE | Freq: Two times a day (BID) | ORAL | Status: DC
  Administered 2020-03-08 – 2020-03-11 (×7): 100 mg via ORAL
  Filled 2020-03-08 (×7): qty 1

## 2020-03-08 MED ORDER — SIMETHICONE 80 MG PO CHEW
80.0000 mg | CHEWABLE_TABLET | Freq: Four times a day (QID) | ORAL | Status: DC | PRN
  Filled 2020-03-08 (×2): qty 1

## 2020-03-08 NOTE — Progress Notes (Signed)
Subjective:  CC: Jeffrey Maynard is a 35 y.o. male  Hospital stay day 3, 1 Day Post-Op PD cath removal.  HPI: Complain of some bloating and difficulty having a bowel movement patient states she has an urge to have a bowel movement but unable to strain.  He has had issues with constipation in the past.  No complaints of nausea or vomiting, but states he does not want to try any solid foods  ROS:  General: Denies weight loss, weight gain, fatigue, fevers, chills, and night sweats. Heart: Denies chest pain, palpitations, racing heart, irregular heartbeat, leg pain or swelling, and decreased activity tolerance. Respiratory: Denies breathing difficulty, shortness of breath, wheezing, cough, and sputum. GI: Denies change in appetite, heartburn, nausea, vomiting, diarrhea, and blood in stool. GU: Denies difficulty urinating, pain with urinating, urgency, frequency, blood in urine.   Objective:   Temp:  [98.4 F (36.9 C)-98.7 F (37.1 C)] 98.7 F (37.1 C) (01/22 0800) Pulse Rate:  [55-61] 61 (01/22 0800) Resp:  [15-18] 18 (01/22 0800) BP: (121-130)/(72-76) 130/76 (01/22 0800) SpO2:  [95 %-98 %] 98 % (01/22 0800)     Height: '5\' 8"'$  (172.7 cm) Weight: 76.7 kg BMI (Calculated): 25.7   Intake/Output this shift:   Intake/Output Summary (Last 24 hours) at 03/08/2020 1634 Last data filed at 03/08/2020 1020 Gross per 24 hour  Intake 1503.21 ml  Output 1000 ml  Net 503.21 ml    Constitutional :  alert, cooperative, appears stated age and no distress  Respiratory:  clear to auscultation bilaterally  Cardiovascular:  regular rate and rhythm  Gastrointestinal: soft, no guarding, distended with PD site dressing intact, some discharge.   Skin: Cool and moist.   Psychiatric: Normal affect, non-agitated, not confused       LABS:  CMP Latest Ref Rng & Units 03/08/2020 03/07/2020 03/06/2020  Glucose 70 - 99 mg/dL 120(H) 84 95  BUN 6 - 20 mg/dL 37(H) 39(H) 35(H)  Creatinine 0.61 - 1.24 mg/dL  3.07(H) 3.14(H) 2.94(H)  Sodium 135 - 145 mmol/L 136 137 137  Potassium 3.5 - 5.1 mmol/L 3.9 3.8 4.0  Chloride 98 - 111 mmol/L 105 106 106  CO2 22 - 32 mmol/L 21(L) 18(L) 20(L)  Calcium 8.9 - 10.3 mg/dL 8.4(L) 8.9 8.7(L)  Total Protein 6.5 - 8.1 g/dL - - -  Total Bilirubin 0.3 - 1.2 mg/dL - - -  Alkaline Phos 38 - 126 U/L - - -  AST 15 - 41 U/L - - -  ALT 0 - 44 U/L - - -   CBC Latest Ref Rng & Units 03/08/2020 03/07/2020 03/06/2020  WBC 4.0 - 10.5 K/uL 8.6 5.1 6.1  Hemoglobin 13.0 - 17.0 g/dL 9.2(L) 9.1(L) 10.3(L)  Hematocrit 39.0 - 52.0 % 27.1(L) 28.0(L) 31.0(L)  Platelets 150 - 400 K/uL 268 250 270    RADS: n/a Assessment:   S/p PD cath removal.  Complaining of some constipation, no nausea, bloating sensation.  Maybe more fecal impaction vs ileus given history of constipation in the past as well as sense of urgency, but unable to pass stool.  Will try suppository x1, gum/hard candy, ambulation, gas-x to if any relief.  Switched back to clear liquid diet just in case this maybe ileus type issue as well.  Will continue to monitor closely.

## 2020-03-08 NOTE — Progress Notes (Signed)
[facility]  ROUNDING NOTE   Subjective:   PD catheter removed yesterday. Adhesions were lysed. Significant amount of small bowel wrapped around the catheter.   Patient states he had a bowel movement this morning. Patient states he is having pain but he has eaten some apple sauce.   Objective:  Vital signs in last 24 hours:  Temp:  [98.4 F (36.9 C)-98.7 F (37.1 C)] 98.7 F (37.1 C) (01/22 0800) Pulse Rate:  [55-61] 61 (01/22 0800) Resp:  [15-18] 18 (01/22 0800) BP: (121-130)/(72-76) 130/76 (01/22 0800) SpO2:  [95 %-98 %] 98 % (01/22 0800)  Weight change:  Filed Weights   03/05/20 1222 03/07/20 1031  Weight: 76.2 kg 76.7 kg    Intake/Output: I/O last 3 completed shifts: In: 1753.2 [I.V.:1553.2; IV Piggyback:200] Out: [id number] [Urine:1750; Blood:10]   Intake/Output this shift:  No intake/output data recorded.  Physical Exam: General:  No acute distress  Head:  Normocephalic, atraumatic. Moist oral mucosal membranes  Eyes:  Anicteric  Neck:  Supple  Lungs:   Clear to auscultation, normal effort  Heart: regular  Abdomen:  +tender to palpation, dressings with blood.   Extremities:  No peripheral edema.  Neurologic:  Awake, alert, following commands  Skin:  No lesions  Access:  PD catheter in place.  No drainage.    Basic Metabolic Panel: Recent Labs  Lab 03/05/20 1236 03/06/20 0407 03/07/20 0435 03/08/20 0435  NA 140 137 137 136  K 3.6 4.0 3.8 3.9  CL 102 106 106 105  CO2 26 20* 18* 21*  GLUCOSE 87 95 84 120*  BUN 12 35* 39* 37*  CREATININE 1.07 2.94* 3.14* 3.07*  CALCIUM 9.6 8.7* 8.9 8.4*  MG  --   --  2.0 1.8  PHOS  --   --   --  2.5    Liver Function Tests: Recent Labs  Lab 03/05/20 1236  AST 20  ALT 12  ALKPHOS 62  BILITOT 0.9  PROT 7.8  ALBUMIN 4.5   Recent Labs  Lab 03/05/20 1236  LIPASE 26   No results for input(s): AMMONIA in the last 168 hours.  CBC: Recent Labs  Lab 03/05/20 1236 03/06/20 0715 03/07/20 0435  03/08/20 0435  WBC 7.3 6.1 5.1 8.6  NEUTROABS  --   --  2.9  --   HGB 13.7 10.3* 9.1* 9.2*  HCT 38.9* 31.0* 28.0* 27.1*  MCV 93.7 90.9 92.1 90.3  PLT 234 270 250 268    Cardiac Enzymes: No results for input(s): CKTOTAL, CKMB, CKMBINDEX, TROPONINI in the last 168 hours.  BNP: Invalid input(s): POCBNP  CBG: No results for input(s): GLUCAP in the last 168 hours.  Microbiology: Results for orders placed or performed during the hospital encounter of 03/05/20  Blood culture (routine x 2)     Status: None (Preliminary result)   Collection Time: 03/05/20  3:45 PM   Specimen: BLOOD  Result Value Ref Range Status   Specimen Description BLOOD RIGHT ANTECUBITAL  Final   Special Requests   Final    BOTTLES DRAWN AEROBIC AND ANAEROBIC Blood Culture adequate volume   Culture   Final    NO GROWTH 3 DAYS Performed at [facility], [address] [place], [place] [address]    Report Status PENDING  Incomplete  Blood culture (routine x 2)     Status: None (Preliminary result)   Collection Time: 03/05/20  3:45 PM   Specimen: BLOOD  Result Value Ref Range Status   Specimen  Description BLOOD LEFT ANTECUBITAL  Final   Special Requests   Final    BOTTLES DRAWN AEROBIC AND ANAEROBIC Blood Culture adequate volume   Culture   Final    NO GROWTH 3 DAYS Performed at [facility], [address] [place], [place] [address]    Report Status PENDING  Incomplete  Resp Panel by RT-PCR (Flu A&B, Covid) Nasopharyngeal Swab     Status: None   Collection Time: 03/05/20  3:45 PM   Specimen: Nasopharyngeal Swab; Nasopharyngeal(NP) swabs in vial transport medium  Result Value Ref Range Status   SARS Coronavirus 2 by RT PCR NEGATIVE NEGATIVE Final    Comment: (NOTE) SARS-CoV-2 target nucleic acids are NOT DETECTED.  The SARS-CoV-2 RNA is generally detectable in upper respiratory specimens during the acute phase of infection. The lowest concentration of SARS-CoV-2 viral copies this  assay can detect is 138 copies/mL. A negative result does not preclude SARS-Cov-2 infection and should not be used as the sole basis for treatment or other patient management decisions. A negative result may occur with  improper specimen collection/handling, submission of specimen other than nasopharyngeal swab, presence of viral mutation(s) within the areas targeted by this assay, and inadequate number of viral copies(<138 copies/mL). A negative result must be combined with clinical observations, patient history, and epidemiological information. The expected result is Negative.  Fact Sheet for Patients:  EntrepreneurPulse.com.au  Fact Sheet for Healthcare Providers:  IncredibleEmployment.be  This test is no t yet approved or cleared by the [place] FDA and  has been authorized for detection and/or diagnosis of SARS-CoV-2 by FDA under an Emergency Use Authorization (EUA). This EUA will remain  in effect (meaning this test can be used) for the duration of the COVID-19 declaration under Section 564(b)(1) of the Act, 21 U.S.C.section 360bbb-3(b)(1), unless the authorization is terminated  or revoked sooner.       Influenza A by PCR NEGATIVE NEGATIVE Final   Influenza B by PCR NEGATIVE NEGATIVE Final    Comment: (NOTE) The Xpert Xpress SARS-CoV-2/FLU/RSV plus assay is intended as an aid in the diagnosis of influenza from Nasopharyngeal swab specimens and should not be used as a sole basis for treatment. Nasal washings and aspirates are unacceptable for Xpert Xpress SARS-CoV-2/FLU/RSV testing.  Fact Sheet for Patients: EntrepreneurPulse.com.au  Fact Sheet for Healthcare Providers: IncredibleEmployment.be  This test is not yet approved or cleared by the [place] FDA and has been authorized for detection and/or diagnosis of SARS-CoV-2 by FDA under an Emergency Use Authorization (EUA). This EUA will  remain in effect (meaning this test can be used) for the duration of the COVID-19 declaration under Section 564(b)(1) of the Act, 21 U.S.C. section 360bbb-3(b)(1), unless the authorization is terminated or revoked.  Performed at [facility], [place], [place] [address]   Gram stain     Status: None   Collection Time: 03/07/20 12:55 PM   Specimen: Catheter Tip  Result Value Ref Range Status   Specimen Description CATH TIP  Final   Special Requests NONE  Final   Gram Stain   Final    RARE WBC PRESENT, PREDOMINANTLY PMN MODERATE GRAM POSITIVE COCCI Performed at [facility], [place] [address], [place], [place] [address]    Report Status 03/08/2020 FINAL  Final  Aerobic/Anaerobic Culture (surgical/deep wound)     Status: None (Preliminary result)   Collection Time: 03/07/20 12:56 PM   Specimen: Wound; Tissue  Result Value Ref Range Status  Specimen Description   Final    WOUND Performed at [facility], [address] [place], [place] [address]    Special Requests   Final    [facility] FLUID Performed at [facility], [address] [place], [place] [address]    Gram Stain   Final    RARE WBC PRESENT, PREDOMINANTLY PMN MODERATE GRAM POSITIVE COCCI    Culture   Final    CULTURE REINCUBATED FOR BETTER GROWTH Performed at [facility], [place] [address] [place],  [address]    Report Status PENDING  Incomplete    Coagulation Studies: No results for input(s): LABPROT, INR in the last 72 hours.  Urinalysis: No results for input(s): COLORURINE, LABSPEC, PHURINE, GLUCOSEU, HGBUR, BILIRUBINUR, KETONESUR, PROTEINUR, UROBILINOGEN, NITRITE, LEUKOCYTESUR in the last 72 hours.  Invalid input(s): APPERANCEUR    Imaging: No results found.   Medications:   . sodium chloride Stopped (03/07/20 1149)  . ceFEPime (MAXIPIME) IV 2 g (03/08/20 1304)   . amLODipine  10 mg Oral Daily  . carvedilol  25 mg Oral BID WC   . docusate sodium  100 mg Oral BID  . enoxaparin (LOVENOX) injection  40 mg Subcutaneous Q24H  . furosemide  20 mg Oral Daily  . montelukast  10 mg Oral Daily  . senna-docusate  2 tablet Oral BID  . sodium chloride flush  3 mL Intravenous Q12H   acetaminophen, morphine injection, ondansetron **OR** ondansetron (ZOFRAN) IV  Assessment/ Plan:  35 y.o. male  Mr. Jeffrey Maynard is 35 y.o. black male with hypertension, prior ESRD with renal recovery now with chronic kidney disease stage IV, proteinuria who presents on 03/05/2020 for Acute peritonitis [facility]) [K65.0] Patient underwent PD catheter removal on 1/21 by Dr. Dahlia Byes.   1. Chronic kidney disease stage IV: with nephrotic range proteinuria.  Patient previously had ESRD but has experienced renal recovery.  No indication for dialysis at this time.   2.  Hypertension.  Continue amlodipine, carvedilol, furosemide. Currently holding irbesartan.   4.  Anemia of chronic kidney disease.  Hemoglobin 9.2  - monitor for ESA requirements   LOS: 3 Janessa Mickle 1/22/20223:19 PM

## 2020-03-08 NOTE — Progress Notes (Signed)
Pt's girlfriend at bedside. States patient was constipated before surgery too.  Pt ambulated with myself in hall after doctor's orders to ambulate. Tolerated well despite being in moderate abdominal pain.

## 2020-03-08 NOTE — Progress Notes (Signed)
PROGRESS NOTE    Jeffrey Maynard  [id number] DOB: 05/09/1985 DOA: 03/05/2020 PCP: Pcp, No   Brief Narrative: This 35 years old male with PMH significant for hypertension, ESRD on peritoneal dialysis for about 8 months with improvement in his renal functions and patient has been off of dialysis for over a month who presents to the emergency room for evaluation of abdominal pain mostly at the site of peritoneal dialysis catheter. Patient had planned appointment for possible removal of catheter on the date of admission.  CT abdomen consistent with small bowel obstruction with acute peritonitis.  Patient was started on vancomycin and cefepime. Nephrology and general surgery consult was obtained.   Vancomycin discontinued.  Patient remained afebrile on cefepime. Patient underwent robotic assisted laparoscopy and removal of PD catheter on 01/21.   Assessment & Plan:   Principal Problem:   Acute peritonitis ([place]) Active Problems:   AKI (acute kidney injury) ([place])   Essential hypertension   Ileus ([facility])   Periumbilical abdominal pain   PD catheter dysfunction ([place])   Acute abdominal pain sec. to acute peritonitis: >>> Improving. Patient presented with abdominal pain around the site of peritoneal catheter. CT abdomen consistent with partial small bowel obstruction with possible peritonitis. General surgery consulted, underwent robotic assisted laparoscopy with removal of PD catheter on 01/21. Blood cultures : No growth so far. Nephrology could not drain any fluid from the PD catheter.   Patient reports slight improvement in his abdominal pain. Continue adequate pain medications with morphine. Initiated antibiotics with vancomycin and cefepime.   Nephrology was consulted, agreed with removal of the PD catheter. Vancomycin discontinued, renal functions worsening, MRSA Nares pending.  Acute kidney injury on CKD stage IV: Patient was on peritoneal dialysis for 8 months. Renal functions  has improved,  He was scheduled to have removal of PD on the day of admission. He was initiated on vancomycin and cefepime which increased his serum creatinine. Vancomycin discontinued, continue IV hydration.  Ileus, partial small bowel obstruction: Continue  NPO, IV fluids. Patient has significant constipation, no bowel movement for the last few days.   Will give a dose of lactulose and scheduled senna. Patient underwent laparoscopic enterolysis, removal of peritoneal dialysis catheter.  Hypertension: Continue amlodipine, Coreg. Avapro discontinued.    DVT prophylaxis: Lovenox  code Status: Full code Family Communication: No Family at bedside. Disposition Plan:   Status is: Inpatient  Remains inpatient appropriate because:Inpatient level of care appropriate due to severity of illness   Dispo: The patient is from: Home              Anticipated d/c is to: Home              Anticipated d/c date is: 3 days              Patient currently is not medically stable to d/c.   Consultants:   General surgery  Nephrology  Procedures: Robotic assisted diagnostic laparoscopy and removal of his PD catheter on 01/21.  Antimicrobials:   Anti-infectives (From admission, onward)   Start     Dose/Rate Route Frequency Ordered Stop   03/07/20 0945  ceFAZolin (ANCEF) IVPB 2g/100 mL premix        2 g 200 mL/hr over 30 Minutes Intravenous On call to O.R. 03/07/20 0846 03/07/20 1209   03/06/20 1400  ceFEPIme (MAXIPIME) 2 g in sodium chloride 0.9 % 100 mL IVPB        2 g 200 mL/hr over 30 Minutes Intravenous  Every 12 hours 03/06/20 0842     03/06/20 0839  vancomycin variable dose per unstable renal function (pharmacist dosing)  Status:  Discontinued         Does not apply See admin instructions 03/06/20 0840 03/06/20 1122   03/06/20 0600  vancomycin (VANCOREADY) IVPB 1250 mg/250 mL  Status:  Discontinued        1,250 mg 166.7 mL/hr over 90 Minutes Intravenous Every 12 hours 03/05/20 1657  03/06/20 0840   03/06/20 0100  vancomycin (VANCOREADY) IVPB 750 mg/150 mL  Status:  Discontinued        750 mg 150 mL/hr over 60 Minutes Intravenous Every 8 hours 03/05/20 1627 03/05/20 1655   03/05/20 1800  vancomycin (VANCOREADY) IVPB 1750 mg/350 mL        1,750 mg 175 mL/hr over 120 Minutes Intravenous  Once 03/05/20 1657 03/05/20 2259   03/05/20 1700  ceFEPIme (MAXIPIME) 2 g in sodium chloride 0.9 % 100 mL IVPB  Status:  Discontinued        2 g 200 mL/hr over 30 Minutes Intravenous Every 8 hours 03/05/20 1627 03/06/20 0842   03/05/20 1700  vancomycin (VANCOREADY) IVPB 2000 mg/400 mL  Status:  Discontinued        2,000 mg 200 mL/hr over 120 Minutes Intravenous  Once 03/05/20 1627 03/05/20 1654      Subjective: Patient was seen and examined at bedside.  Overnight events noted.   Patient reports abdominal pain is better.  He underwent laparoscopic enterolysis and removal of PD catheter. Postoperative day 1.  He reports abdominal soreness.  He also reports having constipation.  Objective: Vitals:   03/07/20 1518 03/07/20 2356 03/08/20 0516 03/08/20 0800  BP: 114/65 128/72 121/74 130/76  Pulse: 60 (!) 55 (!) 58 61  Resp: '15 16 15 18  '$ Temp: 97.6 F (36.4 C) 98.5 F (36.9 C) 98.4 F (36.9 C) 98.7 F (37.1 C)  TempSrc:    Oral  SpO2: 95% 98% 95% 98%  Weight:      Height:        Intake/Output Summary (Last 24 hours) at 03/08/2020 1124 Last data filed at 03/08/2020 1020 Gross per 24 hour  Intake 1753.21 ml  Output 1210 ml  Net 543.21 ml   Filed Weights   03/05/20 1222 03/07/20 1031  Weight: 76.2 kg 76.7 kg    Examination:  General exam: Appears calm and comfortable , appears in significant pain. Respiratory system: Clear to auscultation. Respiratory effort normal. Cardiovascular system: S1 & S2 heard, RRR. No JVD, murmurs, rubs, gallops or clicks. No pedal edema. Gastrointestinal system: Abdomen is nondistended, soft and nontender. No organomegaly or masses felt. Normal  bowel sounds heard.  Surgical scar noted. Central nervous system: Alert and oriented. No focal neurological deficits. Extremities:, No edema, no cyanosis, no clubbing. Skin: No rashes, lesions or ulcers Psychiatry: Judgement and insight appear normal. Mood & affect appropriate.     Data Reviewed: I have personally reviewed following labs and imaging studies  CBC: Recent Labs  Lab 03/05/20 1236 03/06/20 0715 03/07/20 0435 03/08/20 0435  WBC 7.3 6.1 5.1 8.6  NEUTROABS  --   --  2.9  --   HGB 13.7 10.3* 9.1* 9.2*  HCT 38.9* 31.0* 28.0* 27.1*  MCV 93.7 90.9 92.1 90.3  PLT 234 270 250 [id number]   Basic Metabolic Panel: Recent Labs  Lab 03/05/20 1236 03/06/20 0407 03/07/20 0435 03/08/20 0435  NA 140 137 137 136  K 3.6 4.0 3.8 3.9  CL  102 106 106 105  CO2 26 20* 18* 21*  GLUCOSE 87 95 84 120*  BUN 12 35* 39* 37*  CREATININE 1.07 2.94* 3.14* 3.07*  CALCIUM 9.6 8.7* 8.9 8.4*  MG  --   --  2.0 1.8  PHOS  --   --   --  2.5   GFR: Estimated Creatinine Clearance: 33.1 mL/min (A) (by C-G formula based on SCr of 3.07 mg/dL (H)). Liver Function Tests: Recent Labs  Lab 03/05/20 1236  AST 20  ALT 12  ALKPHOS 62  BILITOT 0.9  PROT 7.8  ALBUMIN 4.5   Recent Labs  Lab 03/05/20 1236  LIPASE 26   No results for input(s): AMMONIA in the last 168 hours. Coagulation Profile: No results for input(s): INR, PROTIME in the last 168 hours. Cardiac Enzymes: No results for input(s): CKTOTAL, CKMB, CKMBINDEX, TROPONINI in the last 168 hours. BNP (last 3 results) No results for input(s): PROBNP in the last 8760 hours. HbA1C: No results for input(s): HGBA1C in the last 72 hours. CBG: No results for input(s): GLUCAP in the last 168 hours. Lipid Profile: No results for input(s): CHOL, HDL, LDLCALC, TRIG, CHOLHDL, LDLDIRECT in the last 72 hours. Thyroid Function Tests: No results for input(s): TSH, T4TOTAL, FREET4, T3FREE, THYROIDAB in the last 72 hours. Anemia Panel: No results for  input(s): VITAMINB12, FOLATE, FERRITIN, TIBC, IRON, RETICCTPCT in the last 72 hours. Sepsis Labs: No results for input(s): PROCALCITON, LATICACIDVEN in the last 168 hours.  Recent Results (from the past 240 hour(s))  Blood culture (routine x 2)     Status: None (Preliminary result)   Collection Time: 03/05/20  3:45 PM   Specimen: BLOOD  Result Value Ref Range Status   Specimen Description BLOOD RIGHT ANTECUBITAL  Final   Special Requests   Final    BOTTLES DRAWN AEROBIC AND ANAEROBIC Blood Culture adequate volume   Culture   Final    NO GROWTH 3 DAYS Performed at [facility], [address] [place], [place] [address]    Report Status PENDING  Incomplete  Blood culture (routine x 2)     Status: None (Preliminary result)   Collection Time: 03/05/20  3:45 PM   Specimen: BLOOD  Result Value Ref Range Status   Specimen Description BLOOD LEFT ANTECUBITAL  Final   Special Requests   Final    BOTTLES DRAWN AEROBIC AND ANAEROBIC Blood Culture adequate volume   Culture   Final    NO GROWTH 3 DAYS Performed at [facility], [address] [place], [place] [address]    Report Status PENDING  Incomplete  Resp Panel by RT-PCR (Flu A&B, Covid) Nasopharyngeal Swab     Status: None   Collection Time: 03/05/20  3:45 PM   Specimen: Nasopharyngeal Swab; Nasopharyngeal(NP) swabs in vial transport medium  Result Value Ref Range Status   SARS Coronavirus 2 by RT PCR NEGATIVE NEGATIVE Final    Comment: (NOTE) SARS-CoV-2 target nucleic acids are NOT DETECTED.  The SARS-CoV-2 RNA is generally detectable in upper respiratory specimens during the acute phase of infection. The lowest concentration of SARS-CoV-2 viral copies this assay can detect is 138 copies/mL. A negative result does not preclude SARS-Cov-2 infection and should not be used as the sole basis for treatment or other patient management decisions. A negative result may occur with  improper specimen  collection/handling, submission of specimen other than nasopharyngeal swab, presence of viral mutation(s) within the areas targeted by this assay, and inadequate number of  viral copies(<138 copies/mL). A negative result must be combined with clinical observations, patient history, and epidemiological information. The expected result is Negative.  Fact Sheet for Patients:  EntrepreneurPulse.com.au  Fact Sheet for Healthcare Providers:  IncredibleEmployment.be  This test is no t yet approved or cleared by the [place] FDA and  has been authorized for detection and/or diagnosis of SARS-CoV-2 by FDA under an Emergency Use Authorization (EUA). This EUA will remain  in effect (meaning this test can be used) for the duration of the COVID-19 declaration under Section 564(b)(1) of the Act, 21 U.S.C.section 360bbb-3(b)(1), unless the authorization is terminated  or revoked sooner.       Influenza A by PCR NEGATIVE NEGATIVE Final   Influenza B by PCR NEGATIVE NEGATIVE Final    Comment: (NOTE) The Xpert Xpress SARS-CoV-2/FLU/RSV plus assay is intended as an aid in the diagnosis of influenza from Nasopharyngeal swab specimens and should not be used as a sole basis for treatment. Nasal washings and aspirates are unacceptable for Xpert Xpress SARS-CoV-2/FLU/RSV testing.  Fact Sheet for Patients: EntrepreneurPulse.com.au  Fact Sheet for Healthcare Providers: IncredibleEmployment.be  This test is not yet approved or cleared by the [place] FDA and has been authorized for detection and/or diagnosis of SARS-CoV-2 by FDA under an Emergency Use Authorization (EUA). This EUA will remain in effect (meaning this test can be used) for the duration of the COVID-19 declaration under Section 564(b)(1) of the Act, 21 U.S.C. section 360bbb-3(b)(1), unless the authorization is terminated or revoked.  Performed at [facility], [place], [place] [address]   Gram stain     Status: None   Collection Time: 03/07/20 12:55 PM   Specimen: Catheter Tip  Result Value Ref Range Status   Specimen Description CATH TIP  Final   Special Requests NONE  Final   Gram Stain   Final    RARE WBC PRESENT, PREDOMINANTLY PMN MODERATE GRAM POSITIVE COCCI Performed at [facility], [place] [address] [place], [place] [address]    Report Status 03/08/2020 FINAL  Final  Aerobic/Anaerobic Culture (surgical/deep wound)     Status: None (Preliminary result)   Collection Time: 03/07/20 12:56 PM   Specimen: Wound; Tissue  Result Value Ref Range Status   Specimen Description   Final    WOUND Performed at [facility], [place] [place], [place] [address]    Special Requests   Final    [facility] FLUID Performed at [facility], [address] [place], [place] [address]    Gram Stain   Final    RARE WBC PRESENT, PREDOMINANTLY PMN MODERATE GRAM POSITIVE COCCI    Culture   Final    CULTURE REINCUBATED FOR BETTER GROWTH Performed at [facility], [place] [address] [place], [place] [address]    Report Status PENDING  Incomplete    Radiology Studies: No results found. Scheduled Meds: . amLODipine  10 mg Oral Daily  . carvedilol  25 mg Oral BID WC  . enoxaparin (LOVENOX) injection  40 mg Subcutaneous Q24H  . furosemide  20 mg Oral Daily  . montelukast  10 mg Oral Daily  . senna-docusate  2 tablet Oral BID  . sodium chloride flush  3 mL Intravenous Q12H   Continuous Infusions: . sodium chloride Stopped (03/07/20 1149)  . ceFEPime (MAXIPIME) IV Stopped (03/08/20 0231)     LOS: 3 days    Time spent: 25 mins.    Shawna Clamp, MD  [facility]   If 7PM-7AM, please contact night-coverage

## 2020-03-09 LAB — BASIC METABOLIC PANEL
Anion gap: 11 (ref 5–15)
BUN: 28 mg/dL — ABNORMAL HIGH (ref 6–20)
CO2: 22 mmol/L (ref 22–32)
Calcium: 8.8 mg/dL — ABNORMAL LOW (ref 8.9–10.3)
Chloride: 101 mmol/L (ref 98–111)
Creatinine, Ser: 2.63 mg/dL — ABNORMAL HIGH (ref 0.61–1.24)
GFR, Estimated: 32 mL/min — ABNORMAL LOW (ref >60)
Glucose, Bld: 91 mg/dL (ref 70–99)
Potassium: 3.7 mmol/L (ref 3.5–5.1)
Sodium: 134 mmol/L — ABNORMAL LOW (ref 135–145)

## 2020-03-09 LAB — CBC
HCT: 27.4 % — ABNORMAL LOW (ref 39.0–52.0)
Hemoglobin: 9.1 g/dL — ABNORMAL LOW (ref 13.0–17.0)
MCH: 30 pg (ref 26.0–34.0)
MCHC: 33.2 g/dL (ref 30.0–36.0)
MCV: 90.4 fL (ref 80.0–100.0)
Platelets: 298 10*3/uL (ref 150–400)
RBC: 3.03 MIL/uL — ABNORMAL LOW (ref 4.22–5.81)
RDW: 12.4 % (ref 11.5–15.5)
WBC: 7.7 10*3/uL (ref 4.0–10.5)
nRBC: 0 % (ref 0.0–0.2)

## 2020-03-09 LAB — MAGNESIUM: Magnesium: 1.7 mg/dL (ref 1.7–2.4)

## 2020-03-09 LAB — PHOSPHORUS: Phosphorus: 2.7 mg/dL (ref 2.5–4.6)

## 2020-03-09 MED ORDER — VANCOMYCIN HCL 1500 MG/300ML IV SOLN
1500.0000 mg | Freq: Once | INTRAVENOUS | Status: AC
  Administered 2020-03-10: 1500 mg via INTRAVENOUS
  Filled 2020-03-09: qty 300

## 2020-03-09 MED ORDER — VANCOMYCIN HCL IN DEXTROSE 1-5 GM/200ML-% IV SOLN
1000.0000 mg | INTRAVENOUS | Status: DC
  Filled 2020-03-09: qty 200

## 2020-03-09 MED ORDER — VANCOMYCIN HCL IN DEXTROSE 1-5 GM/200ML-% IV SOLN
1000.0000 mg | INTRAVENOUS | Status: DC
  Administered 2020-03-10: 1000 mg via INTRAVENOUS
  Filled 2020-03-09 (×3): qty 200

## 2020-03-09 NOTE — Consult Note (Signed)
Pharmacy Antibiotic Note  Jeffrey Maynard is a 35 y.o. male admitted on 03/05/2020 with peritonitis. Pt presented with generalized abdominal pain, nausea, and vomiting. Pt states LBM Sunday. Pt was seen in ED with similar complaints of lower abdominal pain and rectal pain on 12/28. Pharmacy has been consulted for vancomycin and cefepime dosing.  12/24 abdominal Xray: Multiple mildly dilated loops of small bowel with air-fluid levels, could reflect enteritis, ileus, or partial small bowel obstruction.  1/19 Abdominal CT: Dilatation of mid small bowel loops, which could be indicative of early or partial small bowel obstruction. However, given the presence of some peritoneal thickening and enhancement and haziness in the fat of the small bowel mesentery associated with these dilated loops of bowel, the possibility of peritonitis and regional small bowel ileus also warrants consideration.  1/20 Patient with history of ESRD on PD. Has not used PD catheter in ~ 1 month due to improvement of renal function. Had planned appointment for possible removal of catheter on date of admission. Unable to collect fluid from PD catheter. Plan to remove PD catheter during this admission. Vancomycin discontinued. WBC WNL, afebrile  1/23 PD catheter tip culture and surgical/deep wound of intraabdominal fluid growing staph aureus. Susceptibilities pending. Pharmacy consulted to restart vancomycin. WBC WNL, afebrile  Scr 1.07 >> 2.94>> 3.14 >> 2.63  Plan: Day 6 antibiotics Vancomycin 1500 mg IV now, then 1000 mg IV q24 hr (est AUC 448.5 based on SCr 2.63; Vd 0.72) Measure vancomycin AUC at steady state as indicated Monitor clinical picture, renal function F/U C&S, abx deescalation / LOT Continue Cefepime 2g IV every 12 hours    Height: '5\' 8"'$  (172.7 cm) Weight: 76.7 kg (169 lb) IBW/kg (Calculated) : 68.4  Temp (24hrs), Avg:98.5 F (36.9 C), Min:98.2 F (36.8 C), Max:98.7 F (37.1 C)  Recent Labs  Lab  03/05/20 1236 03/06/20 0407 03/06/20 0715 03/07/20 0435 03/08/20 0435 03/09/20 0438  WBC 7.3  --  6.1 5.1 8.6 7.7  CREATININE 1.07 2.94*  --  3.14* 3.07* 2.63*    Estimated Creatinine Clearance: 38.7 mL/min (A) (by C-G formula based on SCr of 2.63 mg/dL (H)).    No Known Allergies  Antimicrobials this admission: 1/19 cefepime >> 1/19 vancomycin >> 1/20  1/23 vancomycin >>  Microbiology results: 1/19 BCx: NGTD 1/19 resp. Panel: negative 1/21 peritoneal fluid culture: staph aureus 1/21 catheter tip culture: staph aureus  Thank you for allowing pharmacy to be a part of this patient's care.  Darnelle Bos, PharmD Clinical Pharmacist  03/09/2020 8:54 PM

## 2020-03-09 NOTE — Progress Notes (Addendum)
PROGRESS NOTE    Jeffrey Maynard  [id number] DOB: September 05, 1985 DOA: 03/05/2020 PCP: Pcp, No   Brief Narrative: This 35 years old male with PMH significant for hypertension, ESRD on peritoneal dialysis for about 8 months with improvement in his renal functions and patient has been off of dialysis for over a month who presents to the emergency room for evaluation of abdominal pain mostly at the site of peritoneal dialysis catheter. Patient had planned appointment for possible removal of catheter on the date of admission.  CT abdomen consistent with small bowel obstruction with acute peritonitis.  Patient was started on vancomycin and cefepime. Nephrology and general surgery consult was obtained.   Vancomycin discontinued.  Patient remained afebrile on cefepime. Patient underwent robotic assisted laparoscopy and removal of PD catheter on 01/21.  Postoperative day 2.  Started on clear liquid diet.   Assessment & Plan:   Principal Problem:   Acute peritonitis ([place]) Active Problems:   AKI (acute kidney injury) ([place])   Essential hypertension   Ileus ([facility])   Periumbilical abdominal pain   PD catheter dysfunction ([place])   Acute abdominal pain sec. to acute peritonitis: >>> Improving. Patient presented with abdominal pain around the site of peritoneal catheter. CT abdomen consistent with partial small bowel obstruction with possible peritonitis. General surgery consulted, underwent robotic assisted laparoscopy with removal of PD catheter on 01/21. Blood cultures : No growth so far. Nephrology could not drain any fluid from the PD catheter.   Patient reports slight improvement in his abdominal pain. Continue adequate pain medications with morphine. Initiated antibiotics with vancomycin and cefepime.   Nephrology was consulted, agreed with removal of the PD catheter. Vancomycin discontinued, renal functions worsening. Postoperative day 2, started on clear liquid diet.  Acute kidney injury  on CKD stage IV: Patient was on peritoneal dialysis for 8 months. Renal functions has improved,  He was scheduled to have removal of PD on the day of admission. He was initiated on vancomycin and cefepime which increased his serum creatinine. Vancomycin discontinued, continue IV hydration. Renal functions are improving, no need for hemodialysis at this time per nephro.  Ileus, partial small bowel obstruction: Improving, continue to monitor clinical status. Started on clear liquid diet will advance as tolerated. Patient has significant constipation, no bowel movement for the last few days.   Added Colace, will consider suppository. Patient underwent laparoscopic enterolysis, removal of peritoneal dialysis catheter. Postoperative day 2.  Hypertension: Continue amlodipine, Coreg. Avapro discontinued.    DVT prophylaxis: Lovenox  code Status: Full code Family Communication: No Family at bedside. Disposition Plan:   Status is: Inpatient  Remains inpatient appropriate because:Inpatient level of care appropriate due to severity of illness   Dispo: The patient is from: Home              Anticipated d/c is to: Home              Anticipated d/c date is: 3 days              Patient currently is not medically stable to d/c.   Consultants:   General surgery  Nephrology  Procedures: Robotic assisted diagnostic laparoscopy and removal of his PD catheter on 01/21.  Antimicrobials:   Anti-infectives (From admission, onward)   Start     Dose/Rate Route Frequency Ordered Stop   03/07/20 0945  ceFAZolin (ANCEF) IVPB 2g/100 mL premix        2 g 200 mL/hr over 30 Minutes Intravenous On call to  O.R. 03/07/20 [id number] 03/07/20 1209   03/06/20 1400  ceFEPIme (MAXIPIME) 2 g in sodium chloride 0.9 % 100 mL IVPB        2 g 200 mL/hr over 30 Minutes Intravenous Every 12 hours 03/06/20 0842     03/06/20 0839  vancomycin variable dose per unstable renal function (pharmacist dosing)  Status:   Discontinued         Does not apply See admin instructions 03/06/20 0840 03/06/20 1122   03/06/20 0600  vancomycin (VANCOREADY) IVPB 1250 mg/250 mL  Status:  Discontinued        1,250 mg 166.7 mL/hr over 90 Minutes Intravenous Every 12 hours 03/05/20 1657 03/06/20 0840   03/06/20 0100  vancomycin (VANCOREADY) IVPB 750 mg/150 mL  Status:  Discontinued        750 mg 150 mL/hr over 60 Minutes Intravenous Every 8 hours 03/05/20 1627 03/05/20 1655   03/05/20 1800  vancomycin (VANCOREADY) IVPB 1750 mg/350 mL        1,750 mg 175 mL/hr over 120 Minutes Intravenous  Once 03/05/20 1657 03/05/20 2259   03/05/20 1700  ceFEPIme (MAXIPIME) 2 g in sodium chloride 0.9 % 100 mL IVPB  Status:  Discontinued        2 g 200 mL/hr over 30 Minutes Intravenous Every 8 hours 03/05/20 1627 03/06/20 0842   03/05/20 1700  vancomycin (VANCOREADY) IVPB 2000 mg/400 mL  Status:  Discontinued        2,000 mg 200 mL/hr over 120 Minutes Intravenous  Once 03/05/20 1627 03/05/20 1654      Subjective: Patient was seen and examined at bedside.  Overnight events noted.   Patient reports abdominal pain is better. He underwent laparoscopic enterolysis and removal of PD catheter. Postoperative day 2.  He reports having constipation,  has not had a bowel movement yet.  Objective: Vitals:   03/08/20 2001 03/08/20 2338 03/09/20 0534 03/09/20 0806  BP: 118/70 116/69 130/86 129/87  Pulse: (!) 56 (!) 54 74 (!) 55  Resp: '16 16 17 15  '$ Temp: 98.5 F (36.9 C) 98.5 F (36.9 C) 98.7 F (37.1 C) 98.2 F (36.8 C)  TempSrc:      SpO2: 99% 99% 99% 100%  Weight:      Height:        Intake/Output Summary (Last 24 hours) at 03/09/2020 0959 Last data filed at 03/09/2020 0648 Gross per 24 hour  Intake 789.96 ml  Output 1400 ml  Net -610.04 ml   Filed Weights   03/05/20 1222 03/07/20 1031  Weight: 76.2 kg 76.7 kg    Examination:  General exam: Appears calm and comfortable , appears in significant pain. Respiratory system:  Clear to auscultation. Respiratory effort normal. Cardiovascular system: S1 & S2 heard, RRR. No JVD, murmurs, rubs, gallops or clicks. No pedal edema. Gastrointestinal system: Abdomen is nondistended, soft and nontender. No organomegaly or masses felt.  Normal bowel sounds heard.  Surgical scar noted. Central nervous system: Alert and oriented. No focal neurological deficits. Extremities:, No edema, no cyanosis, no clubbing. Skin: No rashes, lesions or ulcers Psychiatry: Judgement and insight appear normal. Mood & affect appropriate.     Data Reviewed: I have personally reviewed following labs and imaging studies  CBC: Recent Labs  Lab 03/05/20 1236 03/06/20 0715 03/07/20 0435 03/08/20 0435 03/09/20 0438  WBC 7.3 6.1 5.1 8.6 7.7  NEUTROABS  --   --  2.9  --   --   HGB 13.7 10.3* 9.1* 9.2* 9.1*  HCT  38.9* 31.0* 28.0* 27.1* 27.4*  MCV 93.7 90.9 92.1 90.3 90.4  PLT 234 270 250 268 [id number]   Basic Metabolic Panel: Recent Labs  Lab 03/05/20 1236 03/06/20 0407 03/07/20 0435 03/08/20 0435 03/09/20 0438  NA 140 137 137 136 134*  K 3.6 4.0 3.8 3.9 3.7  CL 102 106 106 105 101  CO2 26 20* 18* 21* 22  GLUCOSE 87 95 84 120* 91  BUN 12 35* 39* 37* 28*  CREATININE 1.07 2.94* 3.14* 3.07* 2.63*  CALCIUM 9.6 8.7* 8.9 8.4* 8.8*  MG  --   --  2.0 1.8 1.7  PHOS  --   --   --  2.5 2.7   GFR: Estimated Creatinine Clearance: 38.7 mL/min (A) (by C-G formula based on SCr of 2.63 mg/dL (H)). Liver Function Tests: Recent Labs  Lab 03/05/20 1236  AST 20  ALT 12  ALKPHOS 62  BILITOT 0.9  PROT 7.8  ALBUMIN 4.5   Recent Labs  Lab 03/05/20 1236  LIPASE 26   No results for input(s): AMMONIA in the last 168 hours. Coagulation Profile: No results for input(s): INR, PROTIME in the last 168 hours. Cardiac Enzymes: No results for input(s): CKTOTAL, CKMB, CKMBINDEX, TROPONINI in the last 168 hours. BNP (last 3 results) No results for input(s): PROBNP in the last 8760 hours. HbA1C: No  results for input(s): HGBA1C in the last 72 hours. CBG: No results for input(s): GLUCAP in the last 168 hours. Lipid Profile: No results for input(s): CHOL, HDL, LDLCALC, TRIG, CHOLHDL, LDLDIRECT in the last 72 hours. Thyroid Function Tests: No results for input(s): TSH, T4TOTAL, FREET4, T3FREE, THYROIDAB in the last 72 hours. Anemia Panel: No results for input(s): VITAMINB12, FOLATE, FERRITIN, TIBC, IRON, RETICCTPCT in the last 72 hours. Sepsis Labs: No results for input(s): PROCALCITON, LATICACIDVEN in the last 168 hours.  Recent Results (from the past 240 hour(s))  Blood culture (routine x 2)     Status: None (Preliminary result)   Collection Time: 03/05/20  3:45 PM   Specimen: BLOOD  Result Value Ref Range Status   Specimen Description BLOOD RIGHT ANTECUBITAL  Final   Special Requests   Final    BOTTLES DRAWN AEROBIC AND ANAEROBIC Blood Culture adequate volume   Culture   Final    NO GROWTH 4 DAYS Performed at [facility], [address] [place], [place] [address]    Report Status PENDING  Incomplete  Blood culture (routine x 2)     Status: None (Preliminary result)   Collection Time: 03/05/20  3:45 PM   Specimen: BLOOD  Result Value Ref Range Status   Specimen Description BLOOD LEFT ANTECUBITAL  Final   Special Requests   Final    BOTTLES DRAWN AEROBIC AND ANAEROBIC Blood Culture adequate volume   Culture   Final    NO GROWTH 4 DAYS Performed at [facility], [address], [place], [place] [address]    Report Status PENDING  Incomplete  Resp Panel by RT-PCR (Flu A&B, Covid) Nasopharyngeal Swab     Status: None   Collection Time: 03/05/20  3:45 PM   Specimen: Nasopharyngeal Swab; Nasopharyngeal(NP) swabs in vial transport medium  Result Value Ref Range Status   SARS Coronavirus 2 by RT PCR NEGATIVE NEGATIVE Final    Comment: (NOTE) SARS-CoV-2 target nucleic acids are NOT DETECTED.  The SARS-CoV-2 RNA is generally detectable in upper  respiratory specimens during the acute phase of infection. The lowest concentration of SARS-CoV-2 viral copies this  assay can detect is 138 copies/mL. A negative result does not preclude SARS-Cov-2 infection and should not be used as the sole basis for treatment or other patient management decisions. A negative result may occur with  improper specimen collection/handling, submission of specimen other than nasopharyngeal swab, presence of viral mutation(s) within the areas targeted by this assay, and inadequate number of viral copies(<138 copies/mL). A negative result must be combined with clinical observations, patient history, and epidemiological information. The expected result is Negative.  Fact Sheet for Patients:  EntrepreneurPulse.com.au  Fact Sheet for Healthcare Providers:  IncredibleEmployment.be  This test is no t yet approved or cleared by the [place] FDA and  has been authorized for detection and/or diagnosis of SARS-CoV-2 by FDA under an Emergency Use Authorization (EUA). This EUA will remain  in effect (meaning this test can be used) for the duration of the COVID-19 declaration under Section 564(b)(1) of the Act, 21 U.S.C.section 360bbb-3(b)(1), unless the authorization is terminated  or revoked sooner.       Influenza A by PCR NEGATIVE NEGATIVE Final   Influenza B by PCR NEGATIVE NEGATIVE Final    Comment: (NOTE) The Xpert Xpress SARS-CoV-2/FLU/RSV plus assay is intended as an aid in the diagnosis of influenza from Nasopharyngeal swab specimens and should not be used as a sole basis for treatment. Nasal washings and aspirates are unacceptable for Xpert Xpress SARS-CoV-2/FLU/RSV testing.  Fact Sheet for Patients: EntrepreneurPulse.com.au  Fact Sheet for Healthcare Providers: IncredibleEmployment.be  This test is not yet approved or cleared by the [place] FDA and has been  authorized for detection and/or diagnosis of SARS-CoV-2 by FDA under an Emergency Use Authorization (EUA). This EUA will remain in effect (meaning this test can be used) for the duration of the COVID-19 declaration under Section 564(b)(1) of the Act, 21 U.S.C. section 360bbb-3(b)(1), unless the authorization is terminated or revoked.  Performed at [facility], [address] [place], [place] [address]   Cath Tip Culture     Status: Abnormal (Preliminary result)   Collection Time: 03/07/20 12:55 PM   Specimen: Catheter Tip  Result Value Ref Range Status   Specimen Description CATH TIP  Final   Special Requests NONE  Final   Culture (A)  Final    >=100,000 COLONIES/mL STAPHYLOCOCCUS AUREUS SUSCEPTIBILITIES TO FOLLOW Performed at [facility], [place] [address] [place], [place] [address]    Report Status PENDING  Incomplete  Gram stain     Status: None   Collection Time: 03/07/20 12:55 PM   Specimen: Catheter Tip  Result Value Ref Range Status   Specimen Description CATH TIP  Final   Special Requests NONE  Final   Gram Stain   Final    RARE WBC PRESENT, PREDOMINANTLY PMN MODERATE GRAM POSITIVE COCCI Performed at [facility], [place] [address] [place], [place] [address]    Report Status 03/08/2020 FINAL  Final  Aerobic/Anaerobic Culture (surgical/deep wound)     Status: None (Preliminary result)   Collection Time: 03/07/20 12:56 PM   Specimen: Wound; Tissue  Result Value Ref Range Status   Specimen Description   Final    WOUND Performed at [facility], [address], [place], [place] [address]    Special Requests   Final    [facility] FLUID Performed at [facility], [address] [place], [place] [address]    Gram Stain   Final    RARE WBC PRESENT, PREDOMINANTLY PMN MODERATE GRAM POSITIVE COCCI  Culture   Final    CULTURE REINCUBATED FOR BETTER GROWTH Performed at [facility], [place] [address], [place],  [place] [address]    Report Status PENDING  Incomplete    Radiology Studies: No results found. Scheduled Meds: . amLODipine  10 mg Oral Daily  . carvedilol  25 mg Oral BID WC  . docusate sodium  100 mg Oral BID  . enoxaparin (LOVENOX) injection  40 mg Subcutaneous Q24H  . furosemide  20 mg Oral Daily  . montelukast  10 mg Oral Daily  . senna-docusate  2 tablet Oral BID  . sodium chloride flush  3 mL Intravenous Q12H   Continuous Infusions: . sodium chloride 75 mL/hr at 03/09/20 0648  . ceFEPime (MAXIPIME) IV Stopped (03/09/20 0325)     LOS: 4 days    Time spent: 25 mins.    Shawna Clamp, MD [facility]   If 7PM-7AM, please contact night-coverage

## 2020-03-09 NOTE — Progress Notes (Signed)
[facility]  ROUNDING NOTE   Subjective:   Patient is feeling better today.   Objective:  Vital signs in last 24 hours:  Temp:  [98.1 F (36.7 C)-98.7 F (37.1 C)] 98.7 F (37.1 C) (01/23 1632) Pulse Rate:  [53-74] 53 (01/23 1632) Resp:  [15-17] 16 (01/23 1632) BP: (116-133)/(69-87) 133/76 (01/23 1632) SpO2:  [99 %-100 %] 100 % (01/23 1632)  Weight change:  Filed Weights   03/05/20 1222 03/07/20 1031  Weight: 76.2 kg 76.7 kg    Intake/Output: I/O last 3 completed shifts: In: 2193.2 [I.V.:1896.2; IV Piggyback:297] Out: 2400 [Urine:2400]   Intake/Output this shift:  Total I/O In: 710 [P.O.:510; IV Piggyback:200] Out: -   Physical Exam: General:  No acute distress  Head:  Normocephalic, atraumatic. Moist oral mucosal membranes  Eyes:  Anicteric  Neck:  Supple  Lungs:   Clear to auscultation, normal effort  Heart: regular  Abdomen:  +tender to palpation, dressings with blood.   Extremities:  No peripheral edema.  Neurologic:  Awake, alert, following commands  Skin:  No lesions        Basic Metabolic Panel: Recent Labs  Lab 03/05/20 1236 03/06/20 0407 03/07/20 0435 03/08/20 0435 03/09/20 0438  NA 140 137 137 136 134*  K 3.6 4.0 3.8 3.9 3.7  CL 102 106 106 105 101  CO2 26 20* 18* 21* 22  GLUCOSE 87 95 84 120* 91  BUN 12 35* 39* 37* 28*  CREATININE 1.07 2.94* 3.14* 3.07* 2.63*  CALCIUM 9.6 8.7* 8.9 8.4* 8.8*  MG  --   --  2.0 1.8 1.7  PHOS  --   --   --  2.5 2.7    Liver Function Tests: Recent Labs  Lab 03/05/20 1236  AST 20  ALT 12  ALKPHOS 62  BILITOT 0.9  PROT 7.8  ALBUMIN 4.5   Recent Labs  Lab 03/05/20 1236  LIPASE 26   No results for input(s): AMMONIA in the last 168 hours.  CBC: Recent Labs  Lab 03/05/20 1236 03/06/20 0715 03/07/20 0435 03/08/20 0435 03/09/20 0438  WBC 7.3 6.1 5.1 8.6 7.7  NEUTROABS  --   --  2.9  --   --   HGB 13.7 10.3* 9.1* 9.2* 9.1*  HCT 38.9* 31.0* 28.0* 27.1* 27.4*  MCV 93.7 90.9  92.1 90.3 90.4  PLT 234 270 250 268 298    Cardiac Enzymes: No results for input(s): CKTOTAL, CKMB, CKMBINDEX, TROPONINI in the last 168 hours.  BNP: Invalid input(s): POCBNP  CBG: No results for input(s): GLUCAP in the last 168 hours.  Microbiology: Results for orders placed or performed during the hospital encounter of 03/05/20  Blood culture (routine x 2)     Status: None (Preliminary result)   Collection Time: 03/05/20  3:45 PM   Specimen: BLOOD  Result Value Ref Range Status   Specimen Description BLOOD RIGHT ANTECUBITAL  Final   Special Requests   Final    BOTTLES DRAWN AEROBIC AND ANAEROBIC Blood Culture adequate volume   Culture   Final    NO GROWTH 4 DAYS Performed at [facility], [address] [place], [place] [address]    Report Status PENDING  Incomplete  Blood culture (routine x 2)     Status: None (Preliminary result)   Collection Time: 03/05/20  3:45 PM   Specimen: BLOOD  Result Value Ref Range Status   Specimen Description BLOOD LEFT ANTECUBITAL  Final   Special Requests   Final  BOTTLES DRAWN AEROBIC AND ANAEROBIC Blood Culture adequate volume   Culture   Final    NO GROWTH 4 DAYS Performed at [facility], [address] [place], [place] [address]    Report Status PENDING  Incomplete  Resp Panel by RT-PCR (Flu A&B, Covid) Nasopharyngeal Swab     Status: None   Collection Time: 03/05/20  3:45 PM   Specimen: Nasopharyngeal Swab; Nasopharyngeal(NP) swabs in vial transport medium  Result Value Ref Range Status   SARS Coronavirus 2 by RT PCR NEGATIVE NEGATIVE Final    Comment: (NOTE) SARS-CoV-2 target nucleic acids are NOT DETECTED.  The SARS-CoV-2 RNA is generally detectable in upper respiratory specimens during the acute phase of infection. The lowest concentration of SARS-CoV-2 viral copies this assay can detect is 138 copies/mL. A negative result does not preclude SARS-Cov-2 infection and should not be used as the sole  basis for treatment or other patient management decisions. A negative result may occur with  improper specimen collection/handling, submission of specimen other than nasopharyngeal swab, presence of viral mutation(s) within the areas targeted by this assay, and inadequate number of viral copies(<138 copies/mL). A negative result must be combined with clinical observations, patient history, and epidemiological information. The expected result is Negative.  Fact Sheet for Patients:  EntrepreneurPulse.com.au  Fact Sheet for Healthcare Providers:  IncredibleEmployment.be  This test is no t yet approved or cleared by the [place] FDA and  has been authorized for detection and/or diagnosis of SARS-CoV-2 by FDA under an Emergency Use Authorization (EUA). This EUA will remain  in effect (meaning this test can be used) for the duration of the COVID-19 declaration under Section 564(b)(1) of the Act, 21 U.S.C.section 360bbb-3(b)(1), unless the authorization is terminated  or revoked sooner.       Influenza A by PCR NEGATIVE NEGATIVE Final   Influenza B by PCR NEGATIVE NEGATIVE Final    Comment: (NOTE) The Xpert Xpress SARS-CoV-2/FLU/RSV plus assay is intended as an aid in the diagnosis of influenza from Nasopharyngeal swab specimens and should not be used as a sole basis for treatment. Nasal washings and aspirates are unacceptable for Xpert Xpress SARS-CoV-2/FLU/RSV testing.  Fact Sheet for Patients: EntrepreneurPulse.com.au  Fact Sheet for Healthcare Providers: IncredibleEmployment.be  This test is not yet approved or cleared by the [place] FDA and has been authorized for detection and/or diagnosis of SARS-CoV-2 by FDA under an Emergency Use Authorization (EUA). This EUA will remain in effect (meaning this test can be used) for the duration of the COVID-19 declaration under Section 564(b)(1) of the Act,  21 U.S.C. section 360bbb-3(b)(1), unless the authorization is terminated or revoked.  Performed at [facility], [address] [place], [place] [address]   Cath Tip Culture     Status: Abnormal (Preliminary result)   Collection Time: 03/07/20 12:55 PM   Specimen: Catheter Tip  Result Value Ref Range Status   Specimen Description CATH TIP  Final   Special Requests NONE  Final   Culture (A)  Final    >=100,000 COLONIES/mL STAPHYLOCOCCUS AUREUS SUSCEPTIBILITIES TO FOLLOW Performed at [facility], [place] [address] [place], [place] [address]    Report Status PENDING  Incomplete  Gram stain     Status: None   Collection Time: 03/07/20 12:55 PM   Specimen: Catheter Tip  Result Value Ref Range Status   Specimen Description CATH TIP  Final   Special Requests NONE  Final   Gram Stain   Final  RARE WBC PRESENT, PREDOMINANTLY PMN MODERATE GRAM POSITIVE COCCI Performed at [facility] [address], [place], [place] [address]    Report Status 03/08/2020 FINAL  Final  Aerobic/Anaerobic Culture (surgical/deep wound)     Status: None (Preliminary result)   Collection Time: 03/07/20 12:56 PM   Specimen: Wound; Tissue  Result Value Ref Range Status   Specimen Description   Final    WOUND Performed at [facility], [address], [place], [place] [address]    Special Requests   Final    [facility] FLUID Performed at [facility], [address] [place], [place] [address]    Gram Stain   Final    RARE WBC PRESENT, PREDOMINANTLY PMN MODERATE GRAM POSITIVE COCCI Performed at [facility], [place] [address], [place], [place] [address]    Culture   Final    ABUNDANT STAPHYLOCOCCUS AUREUS SUSCEPTIBILITIES TO FOLLOW NO ANAEROBES ISOLATED; CULTURE IN PROGRESS FOR 5 DAYS    Report Status PENDING  Incomplete    Coagulation Studies: No results for input(s): LABPROT, INR in the last 72 hours.  Urinalysis: No results for input(s):  COLORURINE, LABSPEC, PHURINE, GLUCOSEU, HGBUR, BILIRUBINUR, KETONESUR, PROTEINUR, UROBILINOGEN, NITRITE, LEUKOCYTESUR in the last 72 hours.  Invalid input(s): APPERANCEUR    Imaging: No results found.   Medications:   . sodium chloride Stopped (03/09/20 1220)  . ceFEPime (MAXIPIME) IV 2 g (03/09/20 1434)   . amLODipine  10 mg Oral Daily  . carvedilol  25 mg Oral BID WC  . docusate sodium  100 mg Oral BID  . enoxaparin (LOVENOX) injection  40 mg Subcutaneous Q24H  . furosemide  20 mg Oral Daily  . montelukast  10 mg Oral Daily  . senna-docusate  2 tablet Oral BID  . sodium chloride flush  3 mL Intravenous Q12H   acetaminophen, morphine injection, ondansetron **OR** ondansetron (ZOFRAN) IV, simethicone  Assessment/ Plan:   Jeffrey Maynard is 35 y.o. black male with hypertension, prior ESRD with renal recovery now with chronic kidney disease stage IV, proteinuria who presents on 03/05/2020 for Acute peritonitis [facility]) [K65.0] Patient underwent PD catheter removal on 1/21 by Dr. Dahlia Byes.   1. Chronic kidney disease stage IV: with nephrotic range proteinuria.  Patient previously had ESRD but has experienced renal recovery.  No indication for dialysis at this time.  - Discontinue IV fluids - Check urine studies  2.  Hypertension.  Continue amlodipine, carvedilol, furosemide  3.  Anemia of chronic kidney disease.  Hemoglobin 9.1 - monitor for ESA requirements   LOS: 4 Jeffrey Maynard 1/23/20224:34 PM

## 2020-03-09 NOTE — Plan of Care (Signed)
  Problem: Education: Goal: Knowledge of General Education information will improve Description: Including pain rating scale, medication(s)/side effects and non-pharmacologic comfort measures 03/09/2020 1443 by Cristela Blue, RN Outcome: Progressing 03/09/2020 1443 by Cristela Blue, RN Outcome: Progressing   Problem: Health Behavior/Discharge Planning: Goal: Ability to manage health-related needs will improve 03/09/2020 1443 by Cristela Blue, RN Outcome: Progressing 03/09/2020 1443 by Cristela Blue, RN Outcome: Progressing   Problem: Clinical Measurements: Goal: Ability to maintain clinical measurements within normal limits will improve 03/09/2020 1443 by Cristela Blue, RN Outcome: Progressing 03/09/2020 1443 by Cristela Blue, RN Outcome: Progressing Goal: Will remain free from infection 03/09/2020 1443 by Cristela Blue, RN Outcome: Progressing 03/09/2020 1443 by Cristela Blue, RN Outcome: Progressing Goal: Diagnostic test results will improve 03/09/2020 1443 by Cristela Blue, RN Outcome: Progressing 03/09/2020 1443 by Cristela Blue, RN Outcome: Progressing Goal: Respiratory complications will improve 03/09/2020 1443 by Cristela Blue, RN Outcome: Progressing 03/09/2020 1443 by Cristela Blue, RN Outcome: Progressing Goal: Cardiovascular complication will be avoided 03/09/2020 1443 by Cristela Blue, RN Outcome: Progressing 03/09/2020 1443 by Cristela Blue, RN Outcome: Progressing   Problem: Activity: Goal: Risk for activity intolerance will decrease 03/09/2020 1443 by Cristela Blue, RN Outcome: Progressing 03/09/2020 1443 by Cristela Blue, RN Outcome: Progressing   Problem: Nutrition: Goal: Adequate nutrition will be maintained 03/09/2020 1443 by Cristela Blue, RN Outcome: Progressing 03/09/2020 1443 by Cristela Blue, RN Outcome: Progressing   Problem: Coping: Goal: Level of anxiety will decrease 03/09/2020 1443 by Cristela Blue, RN Outcome:  Progressing 03/09/2020 1443 by Cristela Blue, RN Outcome: Progressing   Problem: Elimination: Goal: Will not experience complications related to bowel motility 03/09/2020 1443 by Cristela Blue, RN Outcome: Progressing 03/09/2020 1443 by Cristela Blue, RN Outcome: Progressing Goal: Will not experience complications related to urinary retention 03/09/2020 1443 by Cristela Blue, RN Outcome: Progressing 03/09/2020 1443 by Cristela Blue, RN Outcome: Progressing   Problem: Pain Managment: Goal: General experience of comfort will improve 03/09/2020 1443 by Cristela Blue, RN Outcome: Progressing 03/09/2020 1443 by Cristela Blue, RN Outcome: Progressing   Problem: Safety: Goal: Ability to remain free from injury will improve 03/09/2020 1443 by Cristela Blue, RN Outcome: Progressing 03/09/2020 1443 by Cristela Blue, RN Outcome: Progressing   Problem: Skin Integrity: Goal: Risk for impaired skin integrity will decrease 03/09/2020 1443 by Cristela Blue, RN Outcome: Progressing 03/09/2020 1443 by Cristela Blue, RN Outcome: Progressing

## 2020-03-09 NOTE — Progress Notes (Signed)
Subjective:  CC: Jeffrey Maynard is a 35 y.o. male  Hospital stay day 4, 2 Days Post-Op PD cath removal.  HPI: Feeling much better today.  Had a BM, less bloated, pain controlled  ROS:  General: Denies weight loss, weight gain, fatigue, fevers, chills, and night sweats. Heart: Denies chest pain, palpitations, racing heart, irregular heartbeat, leg pain or swelling, and decreased activity tolerance. Respiratory: Denies breathing difficulty, shortness of breath, wheezing, cough, and sputum. GI: Denies change in appetite, heartburn, nausea, vomiting, diarrhea, and blood in stool. GU: Denies difficulty urinating, pain with urinating, urgency, frequency, blood in urine.   Objective:   Temp:  [98.2 F (36.8 C)-98.7 F (37.1 C)] 98.6 F (37 C) (01/23 2041) Pulse Rate:  [53-74] 56 (01/23 2041) Resp:  [15-17] 16 (01/23 2041) BP: (116-133)/(69-87) 126/82 (01/23 2041) SpO2:  [99 %-100 %] 100 % (01/23 2041)     Height: '5\' 8"'$  (172.7 cm) Weight: 76.7 kg BMI (Calculated): 25.7   Intake/Output this shift:   Intake/Output Summary (Last 24 hours) at 03/09/2020 2056 Last data filed at 03/09/2020 1500 Gross per 24 hour  Intake 1499.96 ml  Output 1000 ml  Net 499.96 ml    Constitutional :  alert, cooperative, appears stated age and no distress  Respiratory:  clear to auscultation bilaterally  Cardiovascular:  regular rate and rhythm  Gastrointestinal: soft, no guarding, improved distention with PD sites with good granulation tissue. port incisions c/d/i   Skin: Cool and moist.   Psychiatric: Normal affect, non-agitated, not confused       LABS:  CMP Latest Ref Rng & Units 03/09/2020 03/08/2020 03/07/2020  Glucose 70 - 99 mg/dL 91 120(H) 84  BUN 6 - 20 mg/dL 28(H) 37(H) 39(H)  Creatinine 0.61 - 1.24 mg/dL 2.63(H) 3.07(H) 3.14(H)  Sodium 135 - 145 mmol/L 134(L) 136 137  Potassium 3.5 - 5.1 mmol/L 3.7 3.9 3.8  Chloride 98 - 111 mmol/L 101 105 106  CO2 22 - 32 mmol/L 22 21(L) 18(L)  Calcium  8.9 - 10.3 mg/dL 8.8(L) 8.4(L) 8.9  Total Protein 6.5 - 8.1 g/dL - - -  Total Bilirubin 0.3 - 1.2 mg/dL - - -  Alkaline Phos 38 - 126 U/L - - -  AST 15 - 41 U/L - - -  ALT 0 - 44 U/L - - -   CBC Latest Ref Rng & Units 03/09/2020 03/08/2020 03/07/2020  WBC 4.0 - 10.5 K/uL 7.7 8.6 5.1  Hemoglobin 13.0 - 17.0 g/dL 9.1(L) 9.2(L) 9.1(L)  Hematocrit 39.0 - 52.0 % 27.4(L) 27.1(L) 28.0(L)  Platelets 150 - 400 K/uL 298 268 250    RADS: n/a Assessment:   S/p PD cath removal.  Wounds healing well, had BM, distention resolving.  Advanced to renal diet.  Hopefully home in next day or so.

## 2020-03-10 ENCOUNTER — Encounter: Payer: Self-pay | Admitting: Surgery

## 2020-03-10 LAB — BASIC METABOLIC PANEL
Anion gap: 11 (ref 5–15)
BUN: 27 mg/dL — ABNORMAL HIGH (ref 6–20)
CO2: 21 mmol/L — ABNORMAL LOW (ref 22–32)
Calcium: 8.7 mg/dL — ABNORMAL LOW (ref 8.9–10.3)
Chloride: 101 mmol/L (ref 98–111)
Creatinine, Ser: 2.47 mg/dL — ABNORMAL HIGH (ref 0.61–1.24)
GFR, Estimated: 34 mL/min — ABNORMAL LOW (ref >60)
Glucose, Bld: 90 mg/dL (ref 70–99)
Potassium: 3.5 mmol/L (ref 3.5–5.1)
Sodium: 133 mmol/L — ABNORMAL LOW (ref 135–145)

## 2020-03-10 LAB — CULTURE, BLOOD (ROUTINE X 2)
Culture: NO GROWTH
Culture: NO GROWTH
Special Requests: ADEQUATE
Special Requests: ADEQUATE

## 2020-03-10 LAB — HEMOGLOBIN AND HEMATOCRIT, BLOOD
HCT: 27.8 % — ABNORMAL LOW (ref 39.0–52.0)
Hemoglobin: 9.1 g/dL — ABNORMAL LOW (ref 13.0–17.0)

## 2020-03-10 NOTE — Progress Notes (Signed)
PROGRESS NOTE    Jeffrey Maynard  [id number] DOB: 1985-09-18 DOA: 03/05/2020 PCP: Pcp, No   Brief Narrative: This 35 years old male with PMH significant for hypertension, ESRD on peritoneal dialysis for about 8 months with improvement in his renal functions and patient has been off of dialysis for over a month who presents to the emergency room for evaluation of abdominal pain mostly at the site of peritoneal dialysis catheter. Patient had planned appointment for possible removal of catheter on the date of admission.  CT abdomen consistent with small bowel obstruction with acute peritonitis.  Patient was started on vancomycin and cefepime. Nephrology and general surgery consult was obtained.   Vancomycin discontinued.  Patient remained afebrile on cefepime. Patient underwent robotic assisted laparoscopy and removal of PD catheter on 01/21. Postoperative day 3.  Started on renal diet.  Wound culture growing staph aureus, restarted on vancomycin.   Assessment & Plan:   Principal Problem:   Acute peritonitis ([place]) Active Problems:   AKI (acute kidney injury) ([place])   Essential hypertension   Ileus ([facility])   Periumbilical abdominal pain   PD catheter dysfunction ([place])   Acute abdominal pain sec. to acute peritonitis: >>> Improving. Patient presented with abdominal pain around the site of peritoneal catheter. CT abdomen consistent with partial small bowel obstruction with possible peritonitis. General surgery consulted, underwent robotic assisted laparoscopy with removal of PD catheter on 01/21. Blood cultures : No growth so far. Nephrology could not drain any fluid from the PD catheter.   Patient reports slight improvement in his abdominal pain. Continue adequate pain medications with morphine. Initiated antibiotics with vancomycin and cefepime.   Nephrology was consulted, agreed with removal of the PD catheter. Vancomycin discontinued, renal functions Improving Postoperative day 3,  started on clear liquid diet. Patient had a bowel movement, started on renal diet. Wound culture grew staph aureus,  resumed on vancomycin. Continue vancomycin and cefepime for now until sensitivity comes back.  Acute kidney injury on CKD stage IV: >>> Improving Patient was on peritoneal dialysis for 8 months. Renal functions has improved,  He was scheduled to have removal of PD on the day of admission. He was initiated on vancomycin and cefepime which increased his serum creatinine. Vancomycin discontinued, continue IV hydration. Renal functions are improving, no need for hemodialysis at this time per nephro. Vancomycin resumed as wound cultures positive for staph aureus.  Ileus, partial small bowel obstruction: Improving, continue to monitor clinical status. Started on clear liquid diet, advanced to renal diet. Patient had significant constipation, no bowel movement for the last few days.   Added Colace, will consider suppository. Patient underwent laparoscopic enterolysis, removal of peritoneal dialysis catheter. Postoperative day 3.  Patient had bowel movement twice,  able to ambulate well.  Hypertension: Continue amlodipine, Coreg. Avapro discontinued.    DVT prophylaxis: Lovenox  code Status: Full code Family Communication: No Family at bedside. Disposition Plan:   Status is: Inpatient  Remains inpatient appropriate because:Inpatient level of care appropriate due to severity of illness   Dispo: The patient is from: Home              Anticipated d/c is to: Home              Anticipated d/c date is:1 day              Patient currently is not medically stable to d/c.   Consultants:   General surgery  Nephrology  Procedures: Robotic assisted diagnostic laparoscopy and  removal of his PD catheter on 01/21.  Antimicrobials:   Anti-infectives (From admission, onward)   Start     Dose/Rate Route Frequency Ordered Stop   03/10/20 2200  vancomycin (VANCOCIN) IVPB  1000 mg/200 mL premix        1,000 mg 200 mL/hr over 60 Minutes Intravenous Every 24 hours 03/09/20 2127     03/09/20 2200  vancomycin (VANCOREADY) IVPB 1500 mg/300 mL        1,500 mg 150 mL/hr over 120 Minutes Intravenous  Once 03/09/20 2124 03/10/20 0328   03/09/20 2200  vancomycin (VANCOCIN) IVPB 1000 mg/200 mL premix  Status:  Discontinued        1,000 mg 200 mL/hr over 60 Minutes Intravenous Every 24 hours 03/09/20 2124 03/09/20 2127   03/07/20 0945  ceFAZolin (ANCEF) IVPB 2g/100 mL premix        2 g 200 mL/hr over 30 Minutes Intravenous On call to O.R. 03/07/20 0846 03/07/20 1209   03/06/20 1400  ceFEPIme (MAXIPIME) 2 g in sodium chloride 0.9 % 100 mL IVPB        2 g 200 mL/hr over 30 Minutes Intravenous Every 12 hours 03/06/20 0842     03/06/20 0839  vancomycin variable dose per unstable renal function (pharmacist dosing)  Status:  Discontinued         Does not apply See admin instructions 03/06/20 0840 03/06/20 1122   03/06/20 0600  vancomycin (VANCOREADY) IVPB 1250 mg/250 mL  Status:  Discontinued        1,250 mg 166.7 mL/hr over 90 Minutes Intravenous Every 12 hours 03/05/20 1657 03/06/20 0840   03/06/20 0100  vancomycin (VANCOREADY) IVPB 750 mg/150 mL  Status:  Discontinued        750 mg 150 mL/hr over 60 Minutes Intravenous Every 8 hours 03/05/20 1627 03/05/20 1655   03/05/20 1800  vancomycin (VANCOREADY) IVPB 1750 mg/350 mL        1,750 mg 175 mL/hr over 120 Minutes Intravenous  Once 03/05/20 1657 03/05/20 2259   03/05/20 1700  ceFEPIme (MAXIPIME) 2 g in sodium chloride 0.9 % 100 mL IVPB  Status:  Discontinued        2 g 200 mL/hr over 30 Minutes Intravenous Every 8 hours 03/05/20 1627 03/06/20 0842   03/05/20 1700  vancomycin (VANCOREADY) IVPB 2000 mg/400 mL  Status:  Discontinued        2,000 mg 200 mL/hr over 120 Minutes Intravenous  Once 03/05/20 1627 03/05/20 1654      Subjective: Patient was seen and examined at bedside.  Overnight events noted.   Patient  reports abdominal pain is better. He underwent laparoscopic enterolysis and removal of PD catheter. Postoperative day 3.  He had bowel movement twice, he is able to ambulate well.  Objective: Vitals:   03/09/20 2041 03/10/20 0033 03/10/20 0544 03/10/20 0818  BP: 126/82 122/78 124/79 133/85  Pulse: (!) 56 (!) 54 (!) 54 (!) 53  Resp: '16 16 16 18  '$ Temp: 98.6 F (37 C) 98.7 F (37.1 C) 98.5 F (36.9 C) 98.1 F (36.7 C)  TempSrc: Oral Oral Oral Oral  SpO2: 100% 98% 100% 99%  Weight:      Height:        Intake/Output Summary (Last 24 hours) at 03/10/2020 1112 Last data filed at 03/10/2020 1000 Gross per 24 hour  Intake 1056.32 ml  Output 350 ml  Net 706.32 ml   Filed Weights   03/05/20 1222 03/07/20 1031  Weight: 76.2 kg  76.7 kg    Examination:  General exam: Appears calm and comfortable , appears in significant pain. Respiratory system: Clear to auscultation. Respiratory effort normal. Cardiovascular system: S1 & S2 heard, RRR. No JVD, murmurs, rubs, gallops or clicks. No pedal edema. Gastrointestinal system: Abdomen is nondistended, soft and nontender. No organomegaly or masses felt.  Normal bowel sounds heard.  Surgical scar noted. Central nervous system: Alert and oriented. No focal neurological deficits. Extremities:, No edema, no cyanosis, no clubbing. Skin: No rashes, lesions or ulcers Psychiatry: Judgement and insight appear normal. Mood & affect appropriate.     Data Reviewed: I have personally reviewed following labs and imaging studies  CBC: Recent Labs  Lab 03/05/20 1236 03/06/20 0715 03/07/20 0435 03/08/20 0435 03/09/20 0438 03/10/20 0207  WBC 7.3 6.1 5.1 8.6 7.7  --   NEUTROABS  --   --  2.9  --   --   --   HGB 13.7 10.3* 9.1* 9.2* 9.1* 9.1*  HCT 38.9* 31.0* 28.0* 27.1* 27.4* 27.8*  MCV 93.7 90.9 92.1 90.3 90.4  --   PLT 234 270 250 268 298  --    Basic Metabolic Panel: Recent Labs  Lab 03/06/20 0407 03/07/20 0435 03/08/20 0435 03/09/20 0438  03/10/20 0207  NA 137 137 136 134* 133*  K 4.0 3.8 3.9 3.7 3.5  CL 106 106 105 101 101  CO2 20* 18* 21* 22 21*  GLUCOSE 95 84 120* 91 90  BUN 35* 39* 37* 28* 27*  CREATININE 2.94* 3.14* 3.07* 2.63* 2.47*  CALCIUM 8.7* 8.9 8.4* 8.8* 8.7*  MG  --  2.0 1.8 1.7  --   PHOS  --   --  2.5 2.7  --    GFR: Estimated Creatinine Clearance: 41.2 mL/min (A) (by C-G formula based on SCr of 2.47 mg/dL (H)). Liver Function Tests: Recent Labs  Lab 03/05/20 1236  AST 20  ALT 12  ALKPHOS 62  BILITOT 0.9  PROT 7.8  ALBUMIN 4.5   Recent Labs  Lab 03/05/20 1236  LIPASE 26   No results for input(s): AMMONIA in the last 168 hours. Coagulation Profile: No results for input(s): INR, PROTIME in the last 168 hours. Cardiac Enzymes: No results for input(s): CKTOTAL, CKMB, CKMBINDEX, TROPONINI in the last 168 hours. BNP (last 3 results) No results for input(s): PROBNP in the last 8760 hours. HbA1C: No results for input(s): HGBA1C in the last 72 hours. CBG: No results for input(s): GLUCAP in the last 168 hours. Lipid Profile: No results for input(s): CHOL, HDL, LDLCALC, TRIG, CHOLHDL, LDLDIRECT in the last 72 hours. Thyroid Function Tests: No results for input(s): TSH, T4TOTAL, FREET4, T3FREE, THYROIDAB in the last 72 hours. Anemia Panel: No results for input(s): VITAMINB12, FOLATE, FERRITIN, TIBC, IRON, RETICCTPCT in the last 72 hours. Sepsis Labs: No results for input(s): PROCALCITON, LATICACIDVEN in the last 168 hours.  Recent Results (from the past 240 hour(s))  Blood culture (routine x 2)     Status: None   Collection Time: 03/05/20  3:45 PM   Specimen: BLOOD  Result Value Ref Range Status   Specimen Description BLOOD RIGHT ANTECUBITAL  Final   Special Requests   Final    BOTTLES DRAWN AEROBIC AND ANAEROBIC Blood Culture adequate volume   Culture   Final    NO GROWTH 5 DAYS Performed at [facility], [address], [place], [place] [address]    Report Status 03/10/2020  FINAL  Final  Blood culture (routine x 2)  Status: None   Collection Time: 03/05/20  3:45 PM   Specimen: BLOOD  Result Value Ref Range Status   Specimen Description BLOOD LEFT ANTECUBITAL  Final   Special Requests   Final    BOTTLES DRAWN AEROBIC AND ANAEROBIC Blood Culture adequate volume   Culture   Final    NO GROWTH 5 DAYS Performed at [facility], [address] [place], [place] [address]    Report Status 03/10/2020 FINAL  Final  Resp Panel by RT-PCR (Flu A&B, Covid) Nasopharyngeal Swab     Status: None   Collection Time: 03/05/20  3:45 PM   Specimen: Nasopharyngeal Swab; Nasopharyngeal(NP) swabs in vial transport medium  Result Value Ref Range Status   SARS Coronavirus 2 by RT PCR NEGATIVE NEGATIVE Final    Comment: (NOTE) SARS-CoV-2 target nucleic acids are NOT DETECTED.  The SARS-CoV-2 RNA is generally detectable in upper respiratory specimens during the acute phase of infection. The lowest concentration of SARS-CoV-2 viral copies this assay can detect is 138 copies/mL. A negative result does not preclude SARS-Cov-2 infection and should not be used as the sole basis for treatment or other patient management decisions. A negative result may occur with  improper specimen collection/handling, submission of specimen other than nasopharyngeal swab, presence of viral mutation(s) within the areas targeted by this assay, and inadequate number of viral copies(<138 copies/mL). A negative result must be combined with clinical observations, patient history, and epidemiological information. The expected result is Negative.  Fact Sheet for Patients:  EntrepreneurPulse.com.au  Fact Sheet for Healthcare Providers:  IncredibleEmployment.be  This test is no t yet approved or cleared by the [place] FDA and  has been authorized for detection and/or diagnosis of SARS-CoV-2 by FDA under an Emergency Use Authorization (EUA). This EUA  will remain  in effect (meaning this test can be used) for the duration of the COVID-19 declaration under Section 564(b)(1) of the Act, 21 U.S.C.section 360bbb-3(b)(1), unless the authorization is terminated  or revoked sooner.       Influenza A by PCR NEGATIVE NEGATIVE Final   Influenza B by PCR NEGATIVE NEGATIVE Final    Comment: (NOTE) The Xpert Xpress SARS-CoV-2/FLU/RSV plus assay is intended as an aid in the diagnosis of influenza from Nasopharyngeal swab specimens and should not be used as a sole basis for treatment. Nasal washings and aspirates are unacceptable for Xpert Xpress SARS-CoV-2/FLU/RSV testing.  Fact Sheet for Patients: EntrepreneurPulse.com.au  Fact Sheet for Healthcare Providers: IncredibleEmployment.be  This test is not yet approved or cleared by the [place] FDA and has been authorized for detection and/or diagnosis of SARS-CoV-2 by FDA under an Emergency Use Authorization (EUA). This EUA will remain in effect (meaning this test can be used) for the duration of the COVID-19 declaration under Section 564(b)(1) of the Act, 21 U.S.C. section 360bbb-3(b)(1), unless the authorization is terminated or revoked.  Performed at [facility], [address] [place], [place] [address]   Cath Tip Culture     Status: Abnormal (Preliminary result)   Collection Time: 03/07/20 12:55 PM   Specimen: Catheter Tip  Result Value Ref Range Status   Specimen Description CATH TIP  Final   Special Requests NONE  Final   Culture (A)  Final    >=100,000 COLONIES/mL STAPHYLOCOCCUS AUREUS SUSCEPTIBILITIES TO FOLLOW Performed at [facility], [place] [address] [place], [place] [address]    Report Status PENDING  Incomplete  Gram stain     Status: None  Collection Time: 03/07/20 12:55 PM   Specimen: Catheter Tip  Result Value Ref Range Status   Specimen Description CATH TIP  Final   Special Requests NONE  Final   Gram  Stain   Final    RARE WBC PRESENT, PREDOMINANTLY PMN MODERATE GRAM POSITIVE COCCI Performed at [facility], [place] [address] [place], [place] [address]    Report Status 03/08/2020 FINAL  Final  Aerobic/Anaerobic Culture (surgical/deep wound)     Status: None (Preliminary result)   Collection Time: 03/07/20 12:56 PM   Specimen: Wound; Tissue  Result Value Ref Range Status   Specimen Description   Final    WOUND Performed at [facility], [address] [place], [place] [address]    Special Requests   Final    [facility] FLUID Performed at [facility], [address] [place], [place] [address]    Gram Stain   Final    RARE WBC PRESENT, PREDOMINANTLY PMN MODERATE GRAM POSITIVE COCCI Performed at [facility], [place] [address], [place], [place] [address]    Culture   Final    ABUNDANT STAPHYLOCOCCUS AUREUS SUSCEPTIBILITIES TO FOLLOW NO ANAEROBES ISOLATED; CULTURE IN PROGRESS FOR 5 DAYS    Report Status PENDING  Incomplete    Radiology Studies: No results found. Scheduled Meds: . amLODipine  10 mg Oral Daily  . carvedilol  25 mg Oral BID WC  . docusate sodium  100 mg Oral BID  . enoxaparin (LOVENOX) injection  40 mg Subcutaneous Q24H  . furosemide  20 mg Oral Daily  . montelukast  10 mg Oral Daily  . senna-docusate  2 tablet Oral BID  . sodium chloride flush  3 mL Intravenous Q12H   Continuous Infusions: . ceFEPime (MAXIPIME) IV Stopped (03/10/20 0426)  . vancomycin       LOS: 5 days    Time spent: 25 mins.    Shawna Clamp, MD [facility]   If 7PM-7AM, please contact night-coverage

## 2020-03-10 NOTE — Progress Notes (Signed)
[facility]  ROUNDING NOTE   Subjective:   Regular diet. Reports regular bowel movement  Pain 2/10  Objective:  Vital signs in last 24 hours:  Temp:  [98.1 F (36.7 C)-98.7 F (37.1 C)] 98.1 F (36.7 C) (01/24 1116) Pulse Rate:  [53-57] 57 (01/24 1116) Resp:  [16-18] 16 (01/24 1116) BP: (115-133)/(76-85) 115/80 (01/24 1116) SpO2:  [98 %-100 %] 100 % (01/24 1116)  Weight change:  Filed Weights   03/05/20 1222 03/07/20 1031  Weight: 76.2 kg 76.7 kg    Intake/Output: I/O last 3 completed shifts: In: 1816.3 [P.O.:510; I.V.:593; IV Piggyback:713.3] Out: [id number] [Urine:1750]   Intake/Output this shift:  Total I/O In: 360 [P.O.:360] Out: -   Physical Exam: General:  No acute distress  Head:  Normocephalic, atraumatic. Moist oral mucosal membranes  Eyes:  Anicteric  Neck:  Supple  Lungs:   Clear to auscultation, normal effort  Heart: regular  Abdomen:  +tender to palpation, dressings with blood.   Extremities:  No peripheral edema.  Neurologic:  Awake, alert, following commands  Skin:  No lesions        Basic Metabolic Panel: Recent Labs  Lab 03/06/20 0407 03/07/20 0435 03/08/20 0435 03/09/20 0438 03/10/20 0207  NA 137 137 136 134* 133*  K 4.0 3.8 3.9 3.7 3.5  CL 106 106 105 101 101  CO2 20* 18* 21* 22 21*  GLUCOSE 95 84 120* 91 90  BUN 35* 39* 37* 28* 27*  CREATININE 2.94* 3.14* 3.07* 2.63* 2.47*  CALCIUM 8.7* 8.9 8.4* 8.8* 8.7*  MG  --  2.0 1.8 1.7  --   PHOS  --   --  2.5 2.7  --     Liver Function Tests: Recent Labs  Lab 03/05/20 1236  AST 20  ALT 12  ALKPHOS 62  BILITOT 0.9  PROT 7.8  ALBUMIN 4.5   Recent Labs  Lab 03/05/20 1236  LIPASE 26   No results for input(s): AMMONIA in the last 168 hours.  CBC: Recent Labs  Lab 03/05/20 1236 03/06/20 0715 03/07/20 0435 03/08/20 0435 03/09/20 0438 03/10/20 0207  WBC 7.3 6.1 5.1 8.6 7.7  --   NEUTROABS  --   --  2.9  --   --   --   HGB 13.7 10.3* 9.1* 9.2* 9.1* 9.1*  HCT  38.9* 31.0* 28.0* 27.1* 27.4* 27.8*  MCV 93.7 90.9 92.1 90.3 90.4  --   PLT 234 270 250 268 298  --     Cardiac Enzymes: No results for input(s): CKTOTAL, CKMB, CKMBINDEX, TROPONINI in the last 168 hours.  BNP: Invalid input(s): POCBNP  CBG: No results for input(s): GLUCAP in the last 168 hours.  Microbiology: Results for orders placed or performed during the hospital encounter of 03/05/20  Blood culture (routine x 2)     Status: None   Collection Time: 03/05/20  3:45 PM   Specimen: BLOOD  Result Value Ref Range Status   Specimen Description BLOOD RIGHT ANTECUBITAL  Final   Special Requests   Final    BOTTLES DRAWN AEROBIC AND ANAEROBIC Blood Culture adequate volume   Culture   Final    NO GROWTH 5 DAYS Performed at [facility], [address] [place], [place] [address]    Report Status 03/10/2020 FINAL  Final  Blood culture (routine x 2)     Status: None   Collection Time: 03/05/20  3:45 PM   Specimen: BLOOD  Result Value Ref Range Status  Specimen Description BLOOD LEFT ANTECUBITAL  Final   Special Requests   Final    BOTTLES DRAWN AEROBIC AND ANAEROBIC Blood Culture adequate volume   Culture   Final    NO GROWTH 5 DAYS Performed at [facility], [address] [place], [place] [address]    Report Status 03/10/2020 FINAL  Final  Resp Panel by RT-PCR (Flu A&B, Covid) Nasopharyngeal Swab     Status: None   Collection Time: 03/05/20  3:45 PM   Specimen: Nasopharyngeal Swab; Nasopharyngeal(NP) swabs in vial transport medium  Result Value Ref Range Status   SARS Coronavirus 2 by RT PCR NEGATIVE NEGATIVE Final    Comment: (NOTE) SARS-CoV-2 target nucleic acids are NOT DETECTED.  The SARS-CoV-2 RNA is generally detectable in upper respiratory specimens during the acute phase of infection. The lowest concentration of SARS-CoV-2 viral copies this assay can detect is 138 copies/mL. A negative result does not preclude SARS-Cov-2 infection and should  not be used as the sole basis for treatment or other patient management decisions. A negative result may occur with  improper specimen collection/handling, submission of specimen other than nasopharyngeal swab, presence of viral mutation(s) within the areas targeted by this assay, and inadequate number of viral copies(<138 copies/mL). A negative result must be combined with clinical observations, patient history, and epidemiological information. The expected result is Negative.  Fact Sheet for Patients:  EntrepreneurPulse.com.au  Fact Sheet for Healthcare Providers:  IncredibleEmployment.be  This test is no t yet approved or cleared by the [place] FDA and  has been authorized for detection and/or diagnosis of SARS-CoV-2 by FDA under an Emergency Use Authorization (EUA). This EUA will remain  in effect (meaning this test can be used) for the duration of the COVID-19 declaration under Section 564(b)(1) of the Act, 21 U.S.C.section 360bbb-3(b)(1), unless the authorization is terminated  or revoked sooner.       Influenza A by PCR NEGATIVE NEGATIVE Final   Influenza B by PCR NEGATIVE NEGATIVE Final    Comment: (NOTE) The Xpert Xpress SARS-CoV-2/FLU/RSV plus assay is intended as an aid in the diagnosis of influenza from Nasopharyngeal swab specimens and should not be used as a sole basis for treatment. Nasal washings and aspirates are unacceptable for Xpert Xpress SARS-CoV-2/FLU/RSV testing.  Fact Sheet for Patients: EntrepreneurPulse.com.au  Fact Sheet for Healthcare Providers: IncredibleEmployment.be  This test is not yet approved or cleared by the [place] FDA and has been authorized for detection and/or diagnosis of SARS-CoV-2 by FDA under an Emergency Use Authorization (EUA). This EUA will remain in effect (meaning this test can be used) for the duration of the COVID-19 declaration under  Section 564(b)(1) of the Act, 21 U.S.C. section 360bbb-3(b)(1), unless the authorization is terminated or revoked.  Performed at [facility], [address] [place], [place] [address]   Cath Tip Culture     Status: Abnormal (Preliminary result)   Collection Time: 03/07/20 12:55 PM   Specimen: Catheter Tip  Result Value Ref Range Status   Specimen Description CATH TIP  Final   Special Requests NONE  Final   Culture (A)  Final    >=100,000 COLONIES/mL STAPHYLOCOCCUS AUREUS SUSCEPTIBILITIES TO FOLLOW CULTURE REINCUBATED FOR BETTER GROWTH Performed at [facility], [place] [address] [place], [place] [address]    Report Status PENDING  Incomplete  Gram stain     Status: None   Collection Time: 03/07/20 12:55 PM   Specimen: Catheter Tip  Result Value Ref Range Status  Specimen Description CATH TIP  Final   Special Requests NONE  Final   Gram Stain   Final    RARE WBC PRESENT, PREDOMINANTLY PMN MODERATE GRAM POSITIVE COCCI Performed at [facility], [place] [address], [place], [place] [address]    Report Status 03/08/2020 FINAL  Final  Aerobic/Anaerobic Culture (surgical/deep wound)     Status: None (Preliminary result)   Collection Time: 03/07/20 12:56 PM   Specimen: Wound; Tissue  Result Value Ref Range Status   Specimen Description   Final    WOUND Performed at [facility], [address] [place], [place] [address]    Special Requests   Final    [facility] FLUID Performed at [facility], [address] [place], [place] [address]    Gram Stain   Final    RARE WBC PRESENT, PREDOMINANTLY PMN MODERATE GRAM POSITIVE COCCI Performed at [facility], [place] [address] [place],  [address]    Culture   Final    ABUNDANT STAPHYLOCOCCUS AUREUS NO ANAEROBES ISOLATED; CULTURE IN PROGRESS FOR 5 DAYS    Report Status PENDING  Incomplete   Organism ID, Bacteria STAPHYLOCOCCUS AUREUS  Final      Susceptibility    Staphylococcus aureus - MIC*    CIPROFLOXACIN <=0.5 SENSITIVE Sensitive     ERYTHROMYCIN <=0.25 SENSITIVE Sensitive     GENTAMICIN <=0.5 SENSITIVE Sensitive     OXACILLIN 0.5 SENSITIVE Sensitive     TETRACYCLINE <=1 SENSITIVE Sensitive     VANCOMYCIN <=0.5 SENSITIVE Sensitive     TRIMETH/SULFA <=10 SENSITIVE Sensitive     CLINDAMYCIN <=0.25 SENSITIVE Sensitive     RIFAMPIN <=0.5 SENSITIVE Sensitive     Inducible Clindamycin NEGATIVE Sensitive     * ABUNDANT STAPHYLOCOCCUS AUREUS    Coagulation Studies: No results for input(s): LABPROT, INR in the last 72 hours.  Urinalysis: No results for input(s): COLORURINE, LABSPEC, PHURINE, GLUCOSEU, HGBUR, BILIRUBINUR, KETONESUR, PROTEINUR, UROBILINOGEN, NITRITE, LEUKOCYTESUR in the last 72 hours.  Invalid input(s): APPERANCEUR    Imaging: No results found.   Medications:   . ceFEPime (MAXIPIME) IV Stopped (03/10/20 0426)  . vancomycin     . amLODipine  10 mg Oral Daily  . carvedilol  25 mg Oral BID WC  . docusate sodium  100 mg Oral BID  . enoxaparin (LOVENOX) injection  40 mg Subcutaneous Q24H  . furosemide  20 mg Oral Daily  . montelukast  10 mg Oral Daily  . senna-docusate  2 tablet Oral BID  . sodium chloride flush  3 mL Intravenous Q12H   acetaminophen, morphine injection, ondansetron **OR** ondansetron (ZOFRAN) IV, simethicone  Assessment/ Plan:   Mr. Jeffrey Maynard is 35 y.o. black male with hypertension, prior ESRD with renal recovery now with chronic kidney disease stage IV, proteinuria who presents on 03/05/2020 for Acute peritonitis [facility]) [K65.0] Patient underwent PD catheter removal on 1/21 by Dr. Dahlia Byes.   1. Chronic kidney disease stage IV: with nephrotic range proteinuria.  Patient previously had ESRD but has experienced renal recovery.  No indication for dialysis at this time.   2.  Hypertension.  Continue amlodipine, carvedilol, furosemide  3.  Anemia of chronic kidney disease.  Hemoglobin 9.1 - monitor  for ESA requirements   LOS: 5 Jeffrey Maynard 1/24/20222:04 PM

## 2020-03-10 NOTE — Progress Notes (Signed)
[facility] Hospital Day(s): 5.   Post op day(s): 3 Days Post-Op.   Interval History:  Patient seen and examined No acute events or new complaints overnight.  Patient reports he continues to feel better this morning No fever, chills, nausea, emesis, or abdominal pain Cx from OR on 01/21 growing staph aureus; susceptibilities pending; he remains on vancomycin and cefepime  Hgb stable at 9.1 Renal function remains baseline and slightly improved, sCr - 2.47 Mild hyponatremia to 133, no other significant electrolyte derangements Advanced to renal diet without issues He continues to have bowel function; + flatus  Vital signs in last 24 hours: [min-max] current  Temp:  [98.2 F (36.8 C)-98.7 F (37.1 C)] 98.5 F (36.9 C) (01/24 0544) Pulse Rate:  [53-56] 54 (01/24 0544) Resp:  [15-16] 16 (01/24 0544) BP: (122-133)/(76-87) 124/79 (01/24 0544) SpO2:  [98 %-100 %] 100 % (01/24 0544)     Height: '5\' 8"'$  (172.7 cm) Weight: 76.7 kg BMI (Calculated): 25.7   Intake/Output last 2 shifts:  01/23 0701 - 01/24 0700 In: 1026.3 [P.O.:510; IV Piggyback:516.3] Out: 350 [Urine:350]   Physical Exam:  Constitutional: alert, cooperative and no distress  Respiratory: breathing non-labored at rest  Cardiovascular: regular rate and sinus rhythm  Gastrointestinal: Soft, non-tender, non-distended, no rebound/guarding Integumentary: laparoscopic incisions are CDI with dermabond. He has two previous laparoscopic incisions in the LLQ healing via secondary intention, no drainage or erythema, wound beds with granulation tissues present  Labs:  CBC Latest Ref Rng & Units 03/10/2020 03/09/2020 03/08/2020  WBC 4.0 - 10.5 K/uL - 7.7 8.6  Hemoglobin 13.0 - 17.0 g/dL 9.1(L) 9.1(L) 9.2(L)  Hematocrit 39.0 - 52.0 % 27.8(L) 27.4(L) 27.1(L)  Platelets 150 - 400 K/uL - 298 268   CMP Latest Ref Rng & Units 03/10/2020 03/09/2020 03/08/2020  Glucose 70 - 99 mg/dL 90 91 120(H)  BUN 6  - 20 mg/dL 27(H) 28(H) 37(H)  Creatinine 0.61 - 1.24 mg/dL 2.47(H) 2.63(H) 3.07(H)  Sodium 135 - 145 mmol/L 133(L) 134(L) 136  Potassium 3.5 - 5.1 mmol/L 3.5 3.7 3.9  Chloride 98 - 111 mmol/L 101 101 105  CO2 22 - 32 mmol/L 21(L) 22 21(L)  Calcium 8.9 - 10.3 mg/dL 8.7(L) 8.8(L) 8.4(L)  Total Protein 6.5 - 8.1 g/dL - - -  Total Bilirubin 0.3 - 1.2 mg/dL - - -  Alkaline Phos 38 - 126 U/L - - -  AST 15 - 41 U/L - - -  ALT 0 - 44 U/L - - -     Imaging studies: No new pertinent imaging studies   Assessment/Plan:  35 y.o. male with return of bowel function 3 Days Post-Op s/p removal of PD catheter for infected catheter   - Okay to continue renal diet as toelrated   - Monitor abdominal examination; on-going bowel function  - continue IV Abx (Vancomycin + Cefepime); narrow for home; Cx with staph aureus   - Continue superficial dressing to LLQ wounds; change daily; okay to shower at home; do not submerge wounds  - Continue bowel regimen prn  - Pain control prn (minimize narcotics as feasible); antiemetics prn  - Mobilization as tolerated    - Discharge Planning: Okay for discharge per medicine service; will need general surgery follow up in 2 weeks, we will be available    All of the above findings and recommendations were discussed with the patient, and the medical team, and all of patient's questions were answered to his  expressed satisfaction.  --  Edison Simon, PA-C  [facility] 03/10/2020, 7:17 AM [phone] M-F: 7am - 4pm

## 2020-03-11 LAB — BASIC METABOLIC PANEL
Anion gap: 13 (ref 5–15)
BUN: 31 mg/dL — ABNORMAL HIGH (ref 6–20)
CO2: 18 mmol/L — ABNORMAL LOW (ref 22–32)
Calcium: 9 mg/dL (ref 8.9–10.3)
Chloride: 102 mmol/L (ref 98–111)
Creatinine, Ser: 2.66 mg/dL — ABNORMAL HIGH (ref 0.61–1.24)
GFR, Estimated: 32 mL/min — ABNORMAL LOW (ref >60)
Glucose, Bld: 89 mg/dL (ref 70–99)
Potassium: 3.7 mmol/L (ref 3.5–5.1)
Sodium: 133 mmol/L — ABNORMAL LOW (ref 135–145)

## 2020-03-11 LAB — CBC
HCT: 30.6 % — ABNORMAL LOW (ref 39.0–52.0)
Hemoglobin: 10.4 g/dL — ABNORMAL LOW (ref 13.0–17.0)
MCH: 29.9 pg (ref 26.0–34.0)
MCHC: 34 g/dL (ref 30.0–36.0)
MCV: 87.9 fL (ref 80.0–100.0)
Platelets: 408 10*3/uL — ABNORMAL HIGH (ref 150–400)
RBC: 3.48 MIL/uL — ABNORMAL LOW (ref 4.22–5.81)
RDW: 12.3 % (ref 11.5–15.5)
WBC: 6.4 10*3/uL (ref 4.0–10.5)
nRBC: 0 % (ref 0.0–0.2)

## 2020-03-11 LAB — MAGNESIUM: Magnesium: 1.9 mg/dL (ref 1.7–2.4)

## 2020-03-11 LAB — PHOSPHORUS: Phosphorus: 3 mg/dL (ref 2.5–4.6)

## 2020-03-11 MED ORDER — LINEZOLID 600 MG PO TABS: 600.0000 mg | ORAL_TABLET | Freq: Two times a day (BID) | ORAL | 0 refills | Status: DC

## 2020-03-11 MED ORDER — SODIUM BICARBONATE 650 MG PO TABS
650.0000 mg | ORAL_TABLET | Freq: Three times a day (TID) | ORAL | Status: DC
  Administered 2020-03-11: 650 mg via ORAL
  Filled 2020-03-11 (×3): qty 1

## 2020-03-11 NOTE — Progress Notes (Signed)
[facility]  ROUNDING NOTE   Subjective:   Ready to go home  Objective:  Vital signs in last 24 hours:  Temp:  [98 F (36.7 C)-98.9 F (37.2 C)] 98.3 F (36.8 C) (01/25 0915) Pulse Rate:  [46-76] 56 (01/25 0915) Resp:  [15-18] 16 (01/25 0915) BP: (115-128)/(74-92) 128/92 (01/25 0915) SpO2:  [98 %-100 %] 98 % (01/25 0915)  Weight change:  Filed Weights   03/05/20 1222 03/07/20 1031  Weight: 76.2 kg 76.7 kg    Intake/Output: I/O last 3 completed shifts: In: 1456.3 [P.O.:840; IV Piggyback:616.3] Out: 1250 [Urine:1250]   Intake/Output this shift:  No intake/output data recorded.  Physical Exam: General:  No acute distress  Head:  Normocephalic, atraumatic. Moist oral mucosal membranes  Eyes:  Anicteric  Neck:  Supple  Lungs:   Clear to auscultation, normal effort  Heart: regular  Abdomen:  +tender to palpation, dressings with blood.   Extremities:  No peripheral edema.  Neurologic:  Awake, alert, following commands  Skin:  No lesions        Basic Metabolic Panel: Recent Labs  Lab 03/07/20 0435 03/08/20 0435 03/09/20 0438 03/10/20 0207 03/11/20 0438  NA 137 136 134* 133* 133*  K 3.8 3.9 3.7 3.5 3.7  CL 106 105 101 101 102  CO2 18* 21* 22 21* 18*  GLUCOSE 84 120* 91 90 89  BUN 39* 37* 28* 27* 31*  CREATININE 3.14* 3.07* 2.63* 2.47* 2.66*  CALCIUM 8.9 8.4* 8.8* 8.7* 9.0  MG 2.0 1.8 1.7  --  1.9  PHOS  --  2.5 2.7  --  3.0    Liver Function Tests: Recent Labs  Lab 03/05/20 1236  AST 20  ALT 12  ALKPHOS 62  BILITOT 0.9  PROT 7.8  ALBUMIN 4.5   Recent Labs  Lab 03/05/20 1236  LIPASE 26   No results for input(s): AMMONIA in the last 168 hours.  CBC: Recent Labs  Lab 03/06/20 0715 03/07/20 0435 03/08/20 0435 03/09/20 0438 03/10/20 0207 03/11/20 0438  WBC 6.1 5.1 8.6 7.7  --  6.4  NEUTROABS  --  2.9  --   --   --   --   HGB 10.3* 9.1* 9.2* 9.1* 9.1* 10.4*  HCT 31.0* 28.0* 27.1* 27.4* 27.8* 30.6*  MCV 90.9 92.1 90.3 90.4   --  87.9  PLT 270 250 268 298  --  408*    Cardiac Enzymes: No results for input(s): CKTOTAL, CKMB, CKMBINDEX, TROPONINI in the last 168 hours.  BNP: Invalid input(s): POCBNP  CBG: No results for input(s): GLUCAP in the last 168 hours.  Microbiology: Results for orders placed or performed during the hospital encounter of 03/05/20  Blood culture (routine x 2)     Status: None   Collection Time: 03/05/20  3:45 PM   Specimen: BLOOD  Result Value Ref Range Status   Specimen Description BLOOD RIGHT ANTECUBITAL  Final   Special Requests   Final    BOTTLES DRAWN AEROBIC AND ANAEROBIC Blood Culture adequate volume   Culture   Final    NO GROWTH 5 DAYS Performed at [facility], [address] [place], [place] [address]    Report Status 03/10/2020 FINAL  Final  Blood culture (routine x 2)     Status: None   Collection Time: 03/05/20  3:45 PM   Specimen: BLOOD  Result Value Ref Range Status   Specimen Description BLOOD LEFT ANTECUBITAL  Final   Special Requests   Final  BOTTLES DRAWN AEROBIC AND ANAEROBIC Blood Culture adequate volume   Culture   Final    NO GROWTH 5 DAYS Performed at [facility], [address] [place], [place] [address]    Report Status 03/10/2020 FINAL  Final  Resp Panel by RT-PCR (Flu A&B, Covid) Nasopharyngeal Swab     Status: None   Collection Time: 03/05/20  3:45 PM   Specimen: Nasopharyngeal Swab; Nasopharyngeal(NP) swabs in vial transport medium  Result Value Ref Range Status   SARS Coronavirus 2 by RT PCR NEGATIVE NEGATIVE Final    Comment: (NOTE) SARS-CoV-2 target nucleic acids are NOT DETECTED.  The SARS-CoV-2 RNA is generally detectable in upper respiratory specimens during the acute phase of infection. The lowest concentration of SARS-CoV-2 viral copies this assay can detect is 138 copies/mL. A negative result does not preclude SARS-Cov-2 infection and should not be used as the sole basis for treatment or other patient  management decisions. A negative result may occur with  improper specimen collection/handling, submission of specimen other than nasopharyngeal swab, presence of viral mutation(s) within the areas targeted by this assay, and inadequate number of viral copies(<138 copies/mL). A negative result must be combined with clinical observations, patient history, and epidemiological information. The expected result is Negative.  Fact Sheet for Patients:  EntrepreneurPulse.com.au  Fact Sheet for Healthcare Providers:  IncredibleEmployment.be  This test is no t yet approved or cleared by the [place] FDA and  has been authorized for detection and/or diagnosis of SARS-CoV-2 by FDA under an Emergency Use Authorization (EUA). This EUA will remain  in effect (meaning this test can be used) for the duration of the COVID-19 declaration under Section 564(b)(1) of the Act, 21 U.S.C.section 360bbb-3(b)(1), unless the authorization is terminated  or revoked sooner.       Influenza A by PCR NEGATIVE NEGATIVE Final   Influenza B by PCR NEGATIVE NEGATIVE Final    Comment: (NOTE) The Xpert Xpress SARS-CoV-2/FLU/RSV plus assay is intended as an aid in the diagnosis of influenza from Nasopharyngeal swab specimens and should not be used as a sole basis for treatment. Nasal washings and aspirates are unacceptable for Xpert Xpress SARS-CoV-2/FLU/RSV testing.  Fact Sheet for Patients: EntrepreneurPulse.com.au  Fact Sheet for Healthcare Providers: IncredibleEmployment.be  This test is not yet approved or cleared by the [place] FDA and has been authorized for detection and/or diagnosis of SARS-CoV-2 by FDA under an Emergency Use Authorization (EUA). This EUA will remain in effect (meaning this test can be used) for the duration of the COVID-19 declaration under Section 564(b)(1) of the Act, 21 U.S.C. section 360bbb-3(b)(1),  unless the authorization is terminated or revoked.  Performed at [facility], [address] [place], [place] [address]   Cath Tip Culture     Status: Abnormal (Preliminary result)   Collection Time: 03/07/20 12:55 PM   Specimen: Catheter Tip  Result Value Ref Range Status   Specimen Description CATH TIP  Final   Special Requests NONE  Final   Culture (A)  Final    >=100,000 COLONIES/mL STAPHYLOCOCCUS AUREUS SUSCEPTIBILITIES TO FOLLOW CULTURE REINCUBATED FOR BETTER GROWTH Performed at [facility], [place] [address] [place], [place] [address]    Report Status PENDING  Incomplete  Gram stain     Status: None   Collection Time: 03/07/20 12:55 PM   Specimen: Catheter Tip  Result Value Ref Range Status   Specimen Description CATH TIP  Final   Special Requests NONE  Final  Gram Stain   Final    RARE WBC PRESENT, PREDOMINANTLY PMN MODERATE GRAM POSITIVE COCCI Performed at [place] [address], [place], [place] [address]    Report Status 03/08/2020 FINAL  Final  Aerobic/Anaerobic Culture (surgical/deep wound)     Status: None (Preliminary result)   Collection Time: 03/07/20 12:56 PM   Specimen: Wound; Tissue  Result Value Ref Range Status   Specimen Description   Final    WOUND Performed at [facility], [address], [place], [place] [address]    Special Requests   Final    [facility] FLUID Performed at [facility], [address] [place], [place] [address]    Gram Stain   Final    RARE WBC PRESENT, PREDOMINANTLY PMN MODERATE GRAM POSITIVE COCCI Performed at [facility], [place] [address] [place], [place] [address]    Culture   Final    ABUNDANT STAPHYLOCOCCUS AUREUS NO ANAEROBES ISOLATED; CULTURE IN PROGRESS FOR 5 DAYS    Report Status PENDING  Incomplete   Organism ID, Bacteria STAPHYLOCOCCUS AUREUS  Final      Susceptibility   Staphylococcus aureus - MIC*    CIPROFLOXACIN <=0.5 SENSITIVE Sensitive      ERYTHROMYCIN <=0.25 SENSITIVE Sensitive     GENTAMICIN <=0.5 SENSITIVE Sensitive     OXACILLIN 0.5 SENSITIVE Sensitive     TETRACYCLINE <=1 SENSITIVE Sensitive     VANCOMYCIN <=0.5 SENSITIVE Sensitive     TRIMETH/SULFA <=10 SENSITIVE Sensitive     CLINDAMYCIN <=0.25 SENSITIVE Sensitive     RIFAMPIN <=0.5 SENSITIVE Sensitive     Inducible Clindamycin NEGATIVE Sensitive     * ABUNDANT STAPHYLOCOCCUS AUREUS    Coagulation Studies: No results for input(s): LABPROT, INR in the last 72 hours.  Urinalysis: No results for input(s): COLORURINE, LABSPEC, PHURINE, GLUCOSEU, HGBUR, BILIRUBINUR, KETONESUR, PROTEINUR, UROBILINOGEN, NITRITE, LEUKOCYTESUR in the last 72 hours.  Invalid input(s): APPERANCEUR    Imaging: No results found.   Medications:   . ceFEPime (MAXIPIME) IV 2 g (03/11/20 0230)  . vancomycin Stopped (03/10/20 2255)   . amLODipine  10 mg Oral Daily  . carvedilol  25 mg Oral BID WC  . docusate sodium  100 mg Oral BID  . enoxaparin (LOVENOX) injection  40 mg Subcutaneous Q24H  . furosemide  20 mg Oral Daily  . montelukast  10 mg Oral Daily  . senna-docusate  2 tablet Oral BID  . sodium bicarbonate  650 mg Oral TID  . sodium chloride flush  3 mL Intravenous Q12H   acetaminophen, morphine injection, ondansetron **OR** ondansetron (ZOFRAN) IV, simethicone  Assessment/ Plan:   Jeffrey Maynard is 35 y.o. black male with hypertension, prior ESRD with renal recovery now with chronic kidney disease stage IV, proteinuria who presents on 03/05/2020 for Acute peritonitis [facility]) [K65.0] Patient underwent PD catheter removal on 1/21 by Dr. Dahlia Byes.   1. Chronic kidney disease stage IV: with nephrotic range proteinuria.  Patient previously had ESRD but has experienced renal recovery.  No indication for dialysis at this time.  - needs outpatient follow up with Nephrology  2.  Hypertension.  Continue amlodipine, carvedilol, furosemide  3.  Anemia of chronic kidney disease.   Hemoglobin 9.1 - monitor for ESA requirements   LOS: 6 Laurren Lepkowski 1/25/202210:05 AM

## 2020-03-11 NOTE — Discharge Instructions (Signed)
Advised to follow-up with primary care physician in 1 week. Advised to follow-up with general surgery Dr. Dahlia Byes in 2 week. Advised to take linezolid 600 mg twice daily for 14 days for wound culture positive for MSSA.

## 2020-03-11 NOTE — Discharge Summary (Signed)
Physician Discharge Summary  Jeffrey Maynard [id number] DOB: 1985-06-01 DOA: 03/05/2020  PCP: Pcp, No  Admit date: 03/05/2020   Discharge date: 03/11/2020  Admitted From:  Home.  Disposition:  Home.  Recommendations for Outpatient Follow-up:  1. Follow up with PCP in 1-2 weeks. 2. Please obtain BMP/CBC in one week. 3. Advised to follow-up with General surgery Dr. Dahlia Byes in 2 week. 4. Advised to take linezolid 600 mg twice daily for 14 days for wound culture positive for MSSA. 5. Advised to follow-up with nephrology for CKD stage 4  Home Health: None Equipment/Devices: None  Discharge Condition: Stable CODE STATUS:Full code Diet recommendation: Heart Healthy   Brief [facility] course: This 35 years old male with PMH significant for hypertension, ESRD on peritoneal dialysis for about 8 months with improvement in his renal functions and patient has been off of dialysis for over a month who presents to the emergency room for evaluation of abdominal pain mostly at the site of peritoneal dialysis catheter. Patient had planned appointment for possible removal of catheter on the date of admission.  CT abdomen consistent with small bowel obstruction with acute peritonitis.  Patient was admitted for small bowel obstruction with acute peritonitis.  Started on vancomycin and cefepime. Nephrology and General surgery consult was obtained.   Vancomycin discontinued. Patient remained afebrile on cefepime. Patient underwent robotic assisted laparoscopy and removal of PD catheter on 01/21.  He was initially kept n.p.o. and then started on renal diet.    Blood cultures no growth, wound culture growing staph aureus, restarted on vancomycin.  Culture and sensitivity grew MSSA and Enterococcus.  Postoperatively Patient abdominal pain has improved, He had a bowel movement and patient has tolerated regular diet.  Infectious disease consulted,  recommended to discharge patient on linezolid 600 mg twice  daily for 14 days.  Patient is cleared from general surgery to be discharged.  Patient will follow up with Dr. Dahlia Byes in 2 weeks.  Patient will follow up with Dr. Juleen China for follow-up on chronic kidney disease.   He was managed for below problems.   Discharge Diagnoses:  Principal Problem:   Acute peritonitis ([place]) Active Problems:   AKI (acute kidney injury) ([place])   Essential hypertension   Ileus (H[facility])   Periumbilical abdominal pain   PD catheter dysfunction ([place])  Acute abdominal pain sec. to acute peritonitis: >>> Resolved. Patient presented with abdominal pain around the site of peritoneal catheter. CT abdomen consistent with partial small bowel obstruction with possible peritonitis. General surgery consulted, underwent robotic assisted laparoscopy with removal of PD catheter on 01/21. Blood cultures : No growth so far.Nephrology could not drain any fluid from the PD catheter. Patient reports slight improvement in his abdominal pain. Continue adequate pain medications with morphine. Initiated antibiotics with vancomycin and cefepime.   Nephrology was consulted, agreed with removal of the PD catheter. Vancomycin discontinued, renal functions Improving Postoperative day 3, started on clear liquid diet. Patient had a bowel movement, started on renal diet. Wound culture grew staph aureus,  resumed on vancomycin. Continue vancomycin and cefepime for now until sensitivity comes back.  Acute kidney injury on CKD stage IV: >>> Improving Patient was on peritoneal dialysis for 8 months. Renal functions has improved,  He was scheduled to have removal of PD on the day of admission. He was initiated on vancomycin and cefepime which increased his serum creatinine. Vancomycin discontinued, continue IV hydration. Renal functions are improving, no need for hemodialysis at this time per nephro. Vancomycin resumed  as wound cultures positive for staph aureus.  Ileus, partial small bowel  obstruction: Resolved. Improving, continue to monitor clinical status. Started on clear liquid diet, advanced to renal diet. Patient had significant constipation, no bowel movement for the last few days.  Added Colace, will consider suppository. Patient underwent laparoscopic enterolysis, removal of peritoneal dialysis catheter. Postoperative day 3.  Patient had bowel movement twice,  able to ambulate well.  Hypertension: Continue amlodipine, Coreg. Avapro discontinued.  Discharge Instructions  Discharge Instructions    Call MD for:  difficulty breathing, headache or visual disturbances   Complete by: As directed    Call MD for:  persistant dizziness or light-headedness   Complete by: As directed    Call MD for:  persistant nausea and vomiting   Complete by: As directed    Call MD for:  severe uncontrolled pain   Complete by: As directed    Call MD for:  temperature >100.4   Complete by: As directed    Diet - low sodium heart healthy   Complete by: As directed    Diet Carb Modified   Complete by: As directed    Discharge instructions   Complete by: As directed    Advised to follow-up with primary care physician in 1 week. Advised to follow-up with general surgery Dr. Dahlia Byes in 2 week. Advised to take linezolid 600 mg twice daily for 14 days for wound culture positive for MSSA.   Discharge wound care:   Complete by: As directed    Advised Daily dressing change.   Increase activity slowly   Complete by: As directed      Allergies as of 03/11/2020   No Known Allergies     Medication List    TAKE these medications   amLODipine 10 MG tablet Commonly known as: NORVASC Take 1 tablet (10 mg total) by mouth daily.   carvedilol 25 MG tablet Commonly known as: COREG Take 1 tablet (25 mg total) by mouth 2 (two) times daily with a meal.   furosemide 20 MG tablet Commonly known as: LASIX Take 20 mg by mouth daily.   irbesartan 150 MG tablet Commonly known as:  AVAPRO Take 150 mg by mouth every evening.   linezolid 600 MG tablet Commonly known as: Zyvox Take 1 tablet (600 mg total) by mouth 2 (two) times daily.   montelukast 10 MG tablet Commonly known as: SINGULAIR Take 10 mg by mouth daily.   traMADol 50 MG tablet Commonly known as: ULTRAM Take 1 tablet (50 mg total) by mouth every 12 (twelve) hours as needed for severe pain.            Discharge Care Instructions  (From admission, onward)         Start     Ordered   03/11/20 0000  Discharge wound care:       Comments: Advised Daily dressing change.   03/11/20 1409          Follow-up Information    Pabon, [place] F, MD. Schedule an appointment as soon as possible for a visit in 3 week(s).   Specialty: General Surgery Why: s/p lap PD catheter removal  Contact information: [address] [place] [address] [phone]        Lavonia Dana, MD Follow up in 1 week(s).   Specialty: Nephrology Contact information: [address] [place] [address] [phone]              No Known  Allergies  Consultations:  General Surgery.  Nephrology   Procedures/Studies: CT ABDOMEN PELVIS W CONTRAST  Result Date: 03/05/2020 CLINICAL DATA:  34 year old male with history of generalized abdominal pain with nausea and vomiting. Suspected bowel obstruction. EXAM: CT ABDOMEN AND PELVIS WITH CONTRAST TECHNIQUE: Multidetector CT imaging of the abdomen and pelvis was performed using the standard protocol following bolus administration of intravenous contrast. CONTRAST:  167m OMNIPAQUE IOHEXOL 300 MG/ML  SOLN COMPARISON:  CT the abdomen and pelvis 07/31/2014. FINDINGS: Lower chest: Unremarkable. Hepatobiliary: No suspicious cystic or solid hepatic lesions. No intra or extrahepatic biliary ductal dilatation. Gallbladder is normal in appearance. Pancreas: No pancreatic mass. No pancreatic ductal dilatation. No pancreatic or peripancreatic  fluid collections or inflammatory changes. Spleen: Unremarkable. Adrenals/Urinary Tract: Bilateral kidneys and adrenal glands are normal in appearance. No hydroureteronephrosis. Urinary bladder is nearly completely decompressed, but otherwise unremarkable in appearance. Stomach/Bowel: The appearance of the stomach is normal. Proximal small bowel is decompressed. The mid small bowel is dilated with several air-fluid levels, measuring up to 4.6 cm in diameter. Distal small bowel is also completely decompressed. Small amount of gas and stool noted throughout the colon and rectum. Appendix is normal in diameter, without surrounding inflammatory changes. In the tip of the appendix is a small 4 mm appendicoliths. Vascular/Lymphatic: No significant atherosclerotic disease, aneurysm or dissection noted in the abdominal or pelvic vasculature. No lymphadenopathy noted in the abdomen or pelvis. Reproductive: Prostate gland and seminal vesicles are unremarkable in appearance. Other: Tenckhoff dialysis catheter with tip in the right lower quadrant. Small volume of fluid in the low anatomic pelvis, presumably peritoneal dialysate. There does appear to be some thickening and mild enhancement of peritoneal membranes, which may suggest peritonitis. Slight haziness is also noted in the small bowel mesentery, most evident associated with the dilated loops of small bowel. No pneumoperitoneum. Musculoskeletal: There are no aggressive appearing lytic or blastic lesions noted in the visualized portions of the skeleton. IMPRESSION: 1. Dilatation of mid small bowel loops, which could be indicative of early or partial small bowel obstruction. However, given the presence of some peritoneal thickening and enhancement and haziness in the fat of the small bowel mesentery associated with these dilated loops of bowel, the possibility of peritonitis and regional small bowel ileus also warrants consideration. 2. Additional incidental findings, as  above. Electronically Signed   By: DVinnie LangtonM.D.   On: 03/05/2020 13:28    Removal of peritoneal dialysis catheter.   Subjective: Patient was seen and examined at bedside.  Patient reports feeling much better, Patient has bowel movement few times,  ambulated well. He is  cleared from surgery.  Discharge Exam: Vitals:   03/11/20 0445 03/11/20 0915  BP: 123/77 (!) 128/92  Pulse: 76 (!) 56  Resp: 17 16  Temp: 98.4 F (36.9 C) 98.3 F (36.8 C)  SpO2: 100% 98%   Vitals:   03/10/20 2045 03/11/20 0117 03/11/20 0445 03/11/20 0915  BP: 115/80 122/74 123/77 (!) 128/92  Pulse: (!) 47 (!) 56 76 (!) 56  Resp: '16 15 17 16  '$ Temp: 98.9 F (37.2 C) 98.6 F (37 C) 98.4 F (36.9 C) 98.3 F (36.8 C)  TempSrc: Oral Oral Oral Oral  SpO2: 100% 100% 100% 98%  Weight:      Height:        General: Pt is alert, awake, not in acute distress Cardiovascular: RRR, S1/S2 +, no rubs, no gallops Respiratory: CTA bilaterally, no wheezing, no rhonchi Abdominal: Soft, NT, ND, bowel sounds +  Extremities: no edema, no cyanosis    The results of significant diagnostics from this hospitalization (including imaging, microbiology, ancillary and laboratory) are listed below for reference.     Microbiology: Recent Results (from the past 240 hour(s))  Blood culture (routine x 2)     Status: None   Collection Time: 03/05/20  3:45 PM   Specimen: BLOOD  Result Value Ref Range Status   Specimen Description BLOOD RIGHT ANTECUBITAL  Final   Special Requests   Final    BOTTLES DRAWN AEROBIC AND ANAEROBIC Blood Culture adequate volume   Culture   Final    NO GROWTH 5 DAYS Performed at [facility], [address], [place], [place] [address]    Report Status 03/10/2020 FINAL  Final  Blood culture (routine x 2)     Status: None   Collection Time: 03/05/20  3:45 PM   Specimen: BLOOD  Result Value Ref Range Status   Specimen Description BLOOD LEFT ANTECUBITAL  Final   Special Requests    Final    BOTTLES DRAWN AEROBIC AND ANAEROBIC Blood Culture adequate volume   Culture   Final    NO GROWTH 5 DAYS Performed at [facility], [address] [place], [place] [address]    Report Status 03/10/2020 FINAL  Final  Resp Panel by RT-PCR (Flu A&B, Covid) Nasopharyngeal Swab     Status: None   Collection Time: 03/05/20  3:45 PM   Specimen: Nasopharyngeal Swab; Nasopharyngeal(NP) swabs in vial transport medium  Result Value Ref Range Status   SARS Coronavirus 2 by RT PCR NEGATIVE NEGATIVE Final    Comment: (NOTE) SARS-CoV-2 target nucleic acids are NOT DETECTED.  The SARS-CoV-2 RNA is generally detectable in upper respiratory specimens during the acute phase of infection. The lowest concentration of SARS-CoV-2 viral copies this assay can detect is 138 copies/mL. A negative result does not preclude SARS-Cov-2 infection and should not be used as the sole basis for treatment or other patient management decisions. A negative result may occur with  improper specimen collection/handling, submission of specimen other than nasopharyngeal swab, presence of viral mutation(s) within the areas targeted by this assay, and inadequate number of viral copies(<138 copies/mL). A negative result must be combined with clinical observations, patient history, and epidemiological information. The expected result is Negative.  Fact Sheet for Patients:  EntrepreneurPulse.com.au  Fact Sheet for Healthcare Providers:  IncredibleEmployment.be  This test is no t yet approved or cleared by the [place] FDA and  has been authorized for detection and/or diagnosis of SARS-CoV-2 by FDA under an Emergency Use Authorization (EUA). This EUA will remain  in effect (meaning this test can be used) for the duration of the COVID-19 declaration under Section 564(b)(1) of the Act, 21 U.S.C.section 360bbb-3(b)(1), unless the authorization is terminated  or revoked  sooner.       Influenza A by PCR NEGATIVE NEGATIVE Final   Influenza B by PCR NEGATIVE NEGATIVE Final    Comment: (NOTE) The Xpert Xpress SARS-CoV-2/FLU/RSV plus assay is intended as an aid in the diagnosis of influenza from Nasopharyngeal swab specimens and should not be used as a sole basis for treatment. Nasal washings and aspirates are unacceptable for Xpert Xpress SARS-CoV-2/FLU/RSV testing.  Fact Sheet for Patients: EntrepreneurPulse.com.au  Fact Sheet for Healthcare Providers: IncredibleEmployment.be  This test is not yet approved or cleared by the [place] FDA and has been authorized for detection and/or diagnosis of SARS-CoV-2 by FDA under an Emergency Use Authorization (EUA). This  EUA will remain in effect (meaning this test can be used) for the duration of the COVID-19 declaration under Section 564(b)(1) of the Act, 21 U.S.C. section 360bbb-3(b)(1), unless the authorization is terminated or revoked.  Performed at [facility], [address] [place], [place] [address]   Cath Tip Culture     Status: Abnormal (Preliminary result)   Collection Time: 03/07/20 12:55 PM   Specimen: Catheter Tip  Result Value Ref Range Status   Specimen Description CATH TIP  Final   Special Requests NONE  Final   Culture (A)  Final    >=100,000 COLONIES/mL STAPHYLOCOCCUS AUREUS 70,000 COLONIES/mL ENTEROCOCCUS FAECALIS SUSCEPTIBILITIES TO FOLLOW Performed at [facility], [place] [address], [place], [place] [address]    Report Status PENDING  Incomplete  Gram stain     Status: None   Collection Time: 03/07/20 12:55 PM   Specimen: Catheter Tip  Result Value Ref Range Status   Specimen Description CATH TIP  Final   Special Requests NONE  Final   Gram Stain   Final    RARE WBC PRESENT, PREDOMINANTLY PMN MODERATE GRAM POSITIVE COCCI Performed at [facility], [place] [address], [place], [place] [address]    Report Status  03/08/2020 FINAL  Final  Aerobic/Anaerobic Culture (surgical/deep wound)     Status: None (Preliminary result)   Collection Time: 03/07/20 12:56 PM   Specimen: Wound; Tissue  Result Value Ref Range Status   Specimen Description   Final    WOUND Performed at [facility], [address], [place], [place] [address]    Special Requests   Final    [facility] FLUID Performed at [facility], [address] [place], [place] [address]    Gram Stain   Final    RARE WBC PRESENT, PREDOMINANTLY PMN MODERATE GRAM POSITIVE COCCI Performed at [facility], [place] [address] [place], [place] [address]    Culture   Final    ABUNDANT STAPHYLOCOCCUS AUREUS NO ANAEROBES ISOLATED; CULTURE IN PROGRESS FOR 5 DAYS    Report Status PENDING  Incomplete   Organism ID, Bacteria STAPHYLOCOCCUS AUREUS  Final      Susceptibility   Staphylococcus aureus - MIC*    CIPROFLOXACIN <=0.5 SENSITIVE Sensitive     ERYTHROMYCIN <=0.25 SENSITIVE Sensitive     GENTAMICIN <=0.5 SENSITIVE Sensitive     OXACILLIN 0.5 SENSITIVE Sensitive     TETRACYCLINE <=1 SENSITIVE Sensitive     VANCOMYCIN <=0.5 SENSITIVE Sensitive     TRIMETH/SULFA <=10 SENSITIVE Sensitive     CLINDAMYCIN <=0.25 SENSITIVE Sensitive     RIFAMPIN <=0.5 SENSITIVE Sensitive     Inducible Clindamycin NEGATIVE Sensitive     * ABUNDANT STAPHYLOCOCCUS AUREUS     Labs: BNP (last 3 results) No results for input(s): BNP in the last 8760 hours. Basic Metabolic Panel: Recent Labs  Lab 03/07/20 0435 03/08/20 0435 03/09/20 0438 03/10/20 0207 03/11/20 0438  NA 137 136 134* 133* 133*  K 3.8 3.9 3.7 3.5 3.7  CL 106 105 101 101 102  CO2 18* 21* 22 21* 18*  GLUCOSE 84 120* 91 90 89  BUN 39* 37* 28* 27* 31*  CREATININE 3.14* 3.07* 2.63* 2.47* 2.66*  CALCIUM 8.9 8.4* 8.8* 8.7* 9.0  MG 2.0 1.8 1.7  --  1.9  PHOS  --  2.5 2.7  --  3.0   Liver Function Tests: Recent Labs  Lab 03/05/20 1236  AST 20  ALT 12  ALKPHOS  62   BILITOT 0.9  PROT 7.8  ALBUMIN 4.5   Recent Labs  Lab 03/05/20 1236  LIPASE 26   No results for input(s): AMMONIA in the last 168 hours. CBC: Recent Labs  Lab 03/06/20 0715 03/07/20 0435 03/08/20 0435 03/09/20 0438 03/10/20 0207 03/11/20 0438  WBC 6.1 5.1 8.6 7.7  --  6.4  NEUTROABS  --  2.9  --   --   --   --   HGB 10.3* 9.1* 9.2* 9.1* 9.1* 10.4*  HCT 31.0* 28.0* 27.1* 27.4* 27.8* 30.6*  MCV 90.9 92.1 90.3 90.4  --  87.9  PLT 270 250 268 298  --  408*   Cardiac Enzymes: No results for input(s): CKTOTAL, CKMB, CKMBINDEX, TROPONINI in the last 168 hours. BNP: Invalid input(s): POCBNP CBG: No results for input(s): GLUCAP in the last 168 hours. D-Dimer No results for input(s): DDIMER in the last 72 hours. Hgb A1c No results for input(s): HGBA1C in the last 72 hours. Lipid Profile No results for input(s): CHOL, HDL, LDLCALC, TRIG, CHOLHDL, LDLDIRECT in the last 72 hours. Thyroid function studies No results for input(s): TSH, T4TOTAL, T3FREE, THYROIDAB in the last 72 hours.  Invalid input(s): FREET3 Anemia work up No results for input(s): VITAMINB12, FOLATE, FERRITIN, TIBC, IRON, RETICCTPCT in the last 72 hours. Urinalysis    Component Value Date/Time   COLORURINE YELLOW (A) 03/05/2020 1229   APPEARANCEUR HAZY (A) 03/05/2020 1229   LABSPEC 1.029 03/05/2020 1229   PHURINE 5.0 03/05/2020 1229   GLUCOSEU NEGATIVE 03/05/2020 1229   HGBUR SMALL (A) 03/05/2020 1229   BILIRUBINUR NEGATIVE 03/05/2020 1229   KETONESUR 5 (A) 03/05/2020 1229   PROTEINUR 100 (A) 03/05/2020 1229   NITRITE NEGATIVE 03/05/2020 1229   LEUKOCYTESUR NEGATIVE 03/05/2020 1229   Sepsis Labs Invalid input(s): PROCALCITONIN,  WBC,  LACTICIDVEN Microbiology Recent Results (from the past 240 hour(s))  Blood culture (routine x 2)     Status: None   Collection Time: 03/05/20  3:45 PM   Specimen: BLOOD  Result Value Ref Range Status   Specimen Description BLOOD RIGHT ANTECUBITAL  Final   Special  Requests   Final    BOTTLES DRAWN AEROBIC AND ANAEROBIC Blood Culture adequate volume   Culture   Final    NO GROWTH 5 DAYS Performed at [facility], [address], [place], [place] [address]    Report Status 03/10/2020 FINAL  Final  Blood culture (routine x 2)     Status: None   Collection Time: 03/05/20  3:45 PM   Specimen: BLOOD  Result Value Ref Range Status   Specimen Description BLOOD LEFT ANTECUBITAL  Final   Special Requests   Final    BOTTLES DRAWN AEROBIC AND ANAEROBIC Blood Culture adequate volume   Culture   Final    NO GROWTH 5 DAYS Performed at [facility], [address] [place], [place] [address]    Report Status 03/10/2020 FINAL  Final  Resp Panel by RT-PCR (Flu A&B, Covid) Nasopharyngeal Swab     Status: None   Collection Time: 03/05/20  3:45 PM   Specimen: Nasopharyngeal Swab; Nasopharyngeal(NP) swabs in vial transport medium  Result Value Ref Range Status   SARS Coronavirus 2 by RT PCR NEGATIVE NEGATIVE Final    Comment: (NOTE) SARS-CoV-2 target nucleic acids are NOT DETECTED.  The SARS-CoV-2 RNA is generally detectable in upper respiratory specimens during the acute phase of infection. The lowest concentration of SARS-CoV-2 viral copies this assay can detect is  138 copies/mL. A negative result does not preclude SARS-Cov-2 infection and should not be used as the sole basis for treatment or other patient management decisions. A negative result may occur with  improper specimen collection/handling, submission of specimen other than nasopharyngeal swab, presence of viral mutation(s) within the areas targeted by this assay, and inadequate number of viral copies(<138 copies/mL). A negative result must be combined with clinical observations, patient history, and epidemiological information. The expected result is Negative.  Fact Sheet for Patients:  EntrepreneurPulse.com.au  Fact Sheet for Healthcare Providers:   IncredibleEmployment.be  This test is no t yet approved or cleared by the [place] FDA and  has been authorized for detection and/or diagnosis of SARS-CoV-2 by FDA under an Emergency Use Authorization (EUA). This EUA will remain  in effect (meaning this test can be used) for the duration of the COVID-19 declaration under Section 564(b)(1) of the Act, 21 U.S.C.section 360bbb-3(b)(1), unless the authorization is terminated  or revoked sooner.       Influenza A by PCR NEGATIVE NEGATIVE Final   Influenza B by PCR NEGATIVE NEGATIVE Final    Comment: (NOTE) The Xpert Xpress SARS-CoV-2/FLU/RSV plus assay is intended as an aid in the diagnosis of influenza from Nasopharyngeal swab specimens and should not be used as a sole basis for treatment. Nasal washings and aspirates are unacceptable for Xpert Xpress SARS-CoV-2/FLU/RSV testing.  Fact Sheet for Patients: EntrepreneurPulse.com.au  Fact Sheet for Healthcare Providers: IncredibleEmployment.be  This test is not yet approved or cleared by the [place] FDA and has been authorized for detection and/or diagnosis of SARS-CoV-2 by FDA under an Emergency Use Authorization (EUA). This EUA will remain in effect (meaning this test can be used) for the duration of the COVID-19 declaration under Section 564(b)(1) of the Act, 21 U.S.C. section 360bbb-3(b)(1), unless the authorization is terminated or revoked.  Performed at [facility], [address] [place], [place] [address]   Cath Tip Culture     Status: Abnormal (Preliminary result)   Collection Time: 03/07/20 12:55 PM   Specimen: Catheter Tip  Result Value Ref Range Status   Specimen Description CATH TIP  Final   Special Requests NONE  Final   Culture (A)  Final    >=100,000 COLONIES/mL STAPHYLOCOCCUS AUREUS 70,000 COLONIES/mL ENTEROCOCCUS FAECALIS SUSCEPTIBILITIES TO FOLLOW Performed at [place], [place] [address] [place], [place] [address]    Report Status PENDING  Incomplete  Gram stain     Status: None   Collection Time: 03/07/20 12:55 PM   Specimen: Catheter Tip  Result Value Ref Range Status   Specimen Description CATH TIP  Final   Special Requests NONE  Final   Gram Stain   Final    RARE WBC PRESENT, PREDOMINANTLY PMN MODERATE GRAM POSITIVE COCCI Performed at [facility], [place] [address], [place], [place] [address]    Report Status 03/08/2020 FINAL  Final  Aerobic/Anaerobic Culture (surgical/deep wound)     Status: None (Preliminary result)   Collection Time: 03/07/20 12:56 PM   Specimen: Wound; Tissue  Result Value Ref Range Status   Specimen Description   Final    WOUND Performed at [facility], [address] [place], [place] [address]    Special Requests   Final    [facility] FLUID Performed at [facility] [address] [place], [place] [address]    Gram Stain   Final    RARE WBC PRESENT, PREDOMINANTLY PMN MODERATE GRAM POSITIVE  [facility] Performed at [facility], [place] [address] [place], [place] [address]    Culture   Final    ABUNDANT STAPHYLOCOCCUS AUREUS NO ANAEROBES ISOLATED; CULTURE IN PROGRESS FOR 5 DAYS    Report Status PENDING  Incomplete   Organism ID, Bacteria STAPHYLOCOCCUS AUREUS  Final      Susceptibility   Staphylococcus aureus - MIC*    CIPROFLOXACIN <=0.5 SENSITIVE Sensitive     ERYTHROMYCIN <=0.25 SENSITIVE Sensitive     GENTAMICIN <=0.5 SENSITIVE Sensitive     OXACILLIN 0.5 SENSITIVE Sensitive     TETRACYCLINE <=1 SENSITIVE Sensitive     VANCOMYCIN <=0.5 SENSITIVE Sensitive     TRIMETH/SULFA <=10 SENSITIVE Sensitive     CLINDAMYCIN <=0.25 SENSITIVE Sensitive     RIFAMPIN <=0.5 SENSITIVE Sensitive     Inducible Clindamycin NEGATIVE Sensitive     * ABUNDANT STAPHYLOCOCCUS AUREUS     Time coordinating discharge: Over 30 minutes  SIGNED:   Shawna Clamp, MD  [facility] 03/11/2020,  2:10 PM Pager   If 7PM-7AM, please contact night-coverage [email or web address]

## 2020-03-12 LAB — CATH TIP CULTURE: Culture: >=100000 — AB

## 2020-03-12 NOTE — Anesthesia Postprocedure Evaluation (Signed)
Anesthesia Post Note  Patient: Jeffrey Maynard  Procedure(s) Performed: XI ROBOT ASSISTED DIAGNOSTIC LAPAROSCOPY-Catheter Removal (N/A )  Patient location during evaluation: PACU Anesthesia Type: General Level of consciousness: awake and alert and oriented Pain management: pain level controlled Vital Signs Assessment: post-procedure vital signs reviewed and stable Respiratory status: spontaneous breathing Cardiovascular status: blood pressure returned to baseline Anesthetic complications: no   No complications documented.   Last Vitals:  Vitals:   03/11/20 0445 03/11/20 0915  BP: 123/77 (!) 128/92  Pulse: 76 (!) 56  Resp: 17 16  Temp: 36.9 C 36.8 C  SpO2: 100% 98%    Last Pain:  Vitals:   03/11/20 0915  TempSrc: Oral  PainSc:                  [name]

## 2020-03-13 LAB — AEROBIC/ANAEROBIC CULTURE W GRAM STAIN (SURGICAL/DEEP WOUND)

## 2020-03-19 DIAGNOSIS — D631 Anemia in chronic kidney disease: Secondary | ICD-10-CM | POA: Diagnosis not present

## 2020-03-19 DIAGNOSIS — I120 Hypertensive chronic kidney disease with stage 5 chronic kidney disease or end stage renal disease: Secondary | ICD-10-CM | POA: Diagnosis not present

## 2020-03-19 DIAGNOSIS — N2581 Secondary hyperparathyroidism of renal origin: Secondary | ICD-10-CM | POA: Diagnosis not present

## 2020-03-19 DIAGNOSIS — N179 Acute kidney failure, unspecified: Secondary | ICD-10-CM | POA: Diagnosis not present

## 2020-03-26 ENCOUNTER — Telehealth: Payer: Self-pay

## 2020-03-26 NOTE — Telephone Encounter (Signed)
Patient had catheter removed 03/07/20.  Desiree from [facility] called to say patient called their office and stated the Antibiotic that DR.Pabon prescribed was making him sick so he was instructed to stop taking it- Desiree wanted to let us know.

## 2020-04-16 DIAGNOSIS — N184 Chronic kidney disease, stage 4 (severe): Secondary | ICD-10-CM | POA: Insufficient documentation

## 2020-04-16 DIAGNOSIS — I120 Hypertensive chronic kidney disease with stage 5 chronic kidney disease or end stage renal disease: Secondary | ICD-10-CM | POA: Diagnosis not present

## 2020-04-16 DIAGNOSIS — D631 Anemia in chronic kidney disease: Secondary | ICD-10-CM | POA: Diagnosis not present

## 2020-04-16 DIAGNOSIS — N2581 Secondary hyperparathyroidism of renal origin: Secondary | ICD-10-CM | POA: Diagnosis not present

## 2020-04-16 DIAGNOSIS — N179 Acute kidney failure, unspecified: Secondary | ICD-10-CM | POA: Diagnosis not present

## 2020-05-21 DIAGNOSIS — N184 Chronic kidney disease, stage 4 (severe): Secondary | ICD-10-CM | POA: Diagnosis not present

## 2020-05-21 DIAGNOSIS — D631 Anemia in chronic kidney disease: Secondary | ICD-10-CM | POA: Diagnosis not present

## 2020-05-21 DIAGNOSIS — N2581 Secondary hyperparathyroidism of renal origin: Secondary | ICD-10-CM | POA: Diagnosis not present

## 2020-05-21 DIAGNOSIS — I120 Hypertensive chronic kidney disease with stage 5 chronic kidney disease or end stage renal disease: Secondary | ICD-10-CM | POA: Diagnosis not present

## 2020-07-23 DIAGNOSIS — D631 Anemia in chronic kidney disease: Secondary | ICD-10-CM | POA: Diagnosis not present

## 2020-07-23 DIAGNOSIS — N184 Chronic kidney disease, stage 4 (severe): Secondary | ICD-10-CM | POA: Diagnosis not present

## 2020-07-23 DIAGNOSIS — N2581 Secondary hyperparathyroidism of renal origin: Secondary | ICD-10-CM | POA: Diagnosis not present

## 2020-07-23 DIAGNOSIS — I120 Hypertensive chronic kidney disease with stage 5 chronic kidney disease or end stage renal disease: Secondary | ICD-10-CM | POA: Diagnosis not present

## 2020-09-08 DIAGNOSIS — D631 Anemia in chronic kidney disease: Secondary | ICD-10-CM | POA: Diagnosis not present

## 2020-09-08 DIAGNOSIS — N184 Chronic kidney disease, stage 4 (severe): Secondary | ICD-10-CM | POA: Diagnosis not present

## 2020-09-08 DIAGNOSIS — N2581 Secondary hyperparathyroidism of renal origin: Secondary | ICD-10-CM | POA: Diagnosis not present

## 2020-09-08 DIAGNOSIS — I120 Hypertensive chronic kidney disease with stage 5 chronic kidney disease or end stage renal disease: Secondary | ICD-10-CM | POA: Diagnosis not present

## 2020-09-08 DIAGNOSIS — E871 Hypo-osmolality and hyponatremia: Secondary | ICD-10-CM | POA: Insufficient documentation

## 2020-11-25 DIAGNOSIS — N2581 Secondary hyperparathyroidism of renal origin: Secondary | ICD-10-CM | POA: Diagnosis not present

## 2020-11-25 DIAGNOSIS — R0683 Snoring: Secondary | ICD-10-CM | POA: Diagnosis not present

## 2020-11-25 DIAGNOSIS — Z23 Encounter for immunization: Secondary | ICD-10-CM | POA: Diagnosis not present

## 2020-11-25 DIAGNOSIS — Z0000 Encounter for general adult medical examination without abnormal findings: Secondary | ICD-10-CM | POA: Diagnosis not present

## 2020-11-25 DIAGNOSIS — I120 Hypertensive chronic kidney disease with stage 5 chronic kidney disease or end stage renal disease: Secondary | ICD-10-CM | POA: Diagnosis not present

## 2021-01-14 DIAGNOSIS — I120 Hypertensive chronic kidney disease with stage 5 chronic kidney disease or end stage renal disease: Secondary | ICD-10-CM | POA: Diagnosis not present

## 2021-01-14 DIAGNOSIS — N1832 Chronic kidney disease, stage 3b: Secondary | ICD-10-CM | POA: Insufficient documentation

## 2021-01-14 DIAGNOSIS — N2581 Secondary hyperparathyroidism of renal origin: Secondary | ICD-10-CM | POA: Diagnosis not present

## 2021-01-14 DIAGNOSIS — E871 Hypo-osmolality and hyponatremia: Secondary | ICD-10-CM | POA: Diagnosis not present

## 2021-01-14 DIAGNOSIS — D631 Anemia in chronic kidney disease: Secondary | ICD-10-CM | POA: Diagnosis not present

## 2021-02-24 ENCOUNTER — Other Ambulatory Visit: Payer: Self-pay

## 2021-02-24 ENCOUNTER — Ambulatory Visit: Payer: 59

## 2021-02-24 DIAGNOSIS — Z0110 Encounter for examination of ears and hearing without abnormal findings: Secondary | ICD-10-CM

## 2021-02-24 NOTE — Progress Notes (Signed)
Presents to clinic for annual hearing screening. Jeffrey Maynard works for [employer] which is part of the [employer].

## 2021-02-27 ENCOUNTER — Other Ambulatory Visit: Payer: Self-pay

## 2021-02-27 VITALS — Temp 98.7°F | Resp 12

## 2021-02-27 DIAGNOSIS — Z1152 Encounter for screening for COVID-19: Secondary | ICD-10-CM

## 2021-02-27 LAB — POCT INFLUENZA A/B
Influenza A, POC: NEGATIVE
Influenza B, POC: NEGATIVE

## 2021-02-27 LAB — POCT RAPID STREP A (OFFICE): Rapid Strep A Screen: NEGATIVE

## 2021-02-27 LAB — POC COVID19 BINAXNOW: SARS Coronavirus 2 Ag: POSITIVE — AB

## 2021-02-27 NOTE — Progress Notes (Signed)
Pt presents with c/o sore throat and mild body aches, fatigue x 2 days. Unable to visualize throat / tonsils through exam. Temp 98.83F Temporal. Rapid COVID is positive.  Flu, Strep tests negative.  PCR test submitted to confirm.

## 2021-02-28 LAB — SARS-COV-2, NAA 2 DAY TAT

## 2021-02-28 LAB — NOVEL CORONAVIRUS, NAA: SARS-CoV-2, NAA: NOT DETECTED

## 2021-04-28 DIAGNOSIS — I10 Essential (primary) hypertension: Secondary | ICD-10-CM | POA: Diagnosis not present

## 2021-04-28 DIAGNOSIS — N2581 Secondary hyperparathyroidism of renal origin: Secondary | ICD-10-CM | POA: Diagnosis not present

## 2021-04-28 DIAGNOSIS — N1832 Chronic kidney disease, stage 3b: Secondary | ICD-10-CM | POA: Diagnosis not present

## 2021-04-28 DIAGNOSIS — D631 Anemia in chronic kidney disease: Secondary | ICD-10-CM | POA: Diagnosis not present

## 2021-04-28 DIAGNOSIS — R809 Proteinuria, unspecified: Secondary | ICD-10-CM | POA: Diagnosis not present

## 2021-04-28 DIAGNOSIS — E871 Hypo-osmolality and hyponatremia: Secondary | ICD-10-CM | POA: Diagnosis not present

## 2021-08-31 DIAGNOSIS — D631 Anemia in chronic kidney disease: Secondary | ICD-10-CM | POA: Diagnosis not present

## 2021-08-31 DIAGNOSIS — N2581 Secondary hyperparathyroidism of renal origin: Secondary | ICD-10-CM | POA: Diagnosis not present

## 2021-08-31 DIAGNOSIS — E871 Hypo-osmolality and hyponatremia: Secondary | ICD-10-CM | POA: Diagnosis not present

## 2021-08-31 DIAGNOSIS — N1832 Chronic kidney disease, stage 3b: Secondary | ICD-10-CM | POA: Diagnosis not present

## 2021-08-31 DIAGNOSIS — R809 Proteinuria, unspecified: Secondary | ICD-10-CM | POA: Diagnosis not present

## 2021-08-31 DIAGNOSIS — I10 Essential (primary) hypertension: Secondary | ICD-10-CM | POA: Diagnosis not present

## 2021-10-02 IMAGING — US US RENAL
1 series · 14 of 25 positions shown · non-contrast
Comparison: Prior CT from 07/31/2014.

CLINICAL DATA: Initial evaluation for acute renal failure,
hematuria, nephrolithiasis.

EXAM:
RENAL / URINARY TRACT ULTRASOUND COMPLETE

[Series 1: us renal · 14 of 31 slices shown]
[im 1/31]
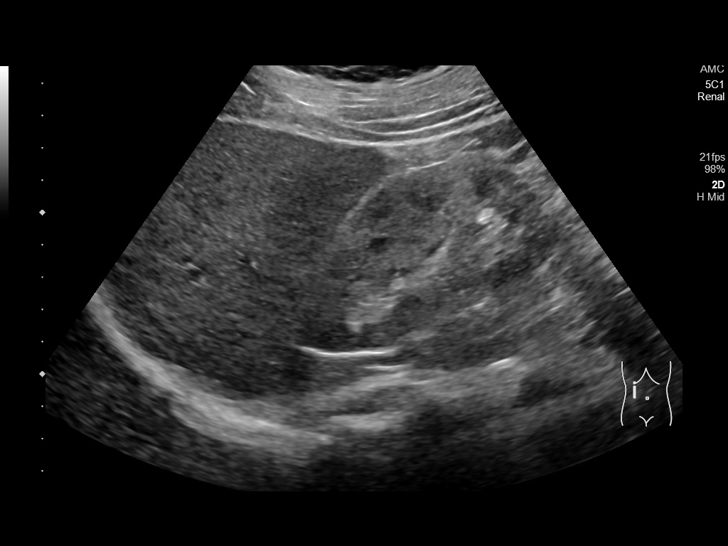
[im 3/31]
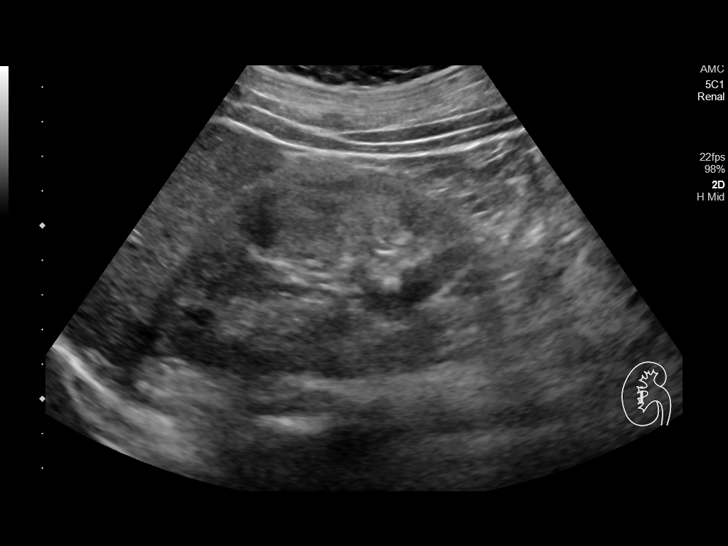
[im 6/31]
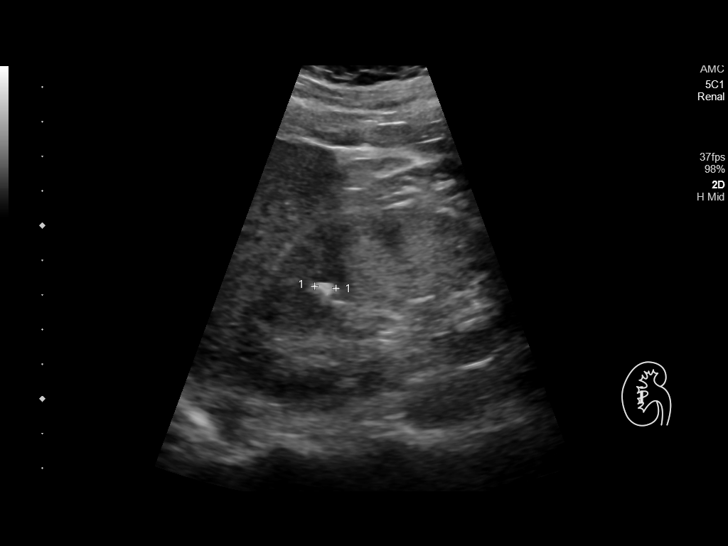
[im 8/31]
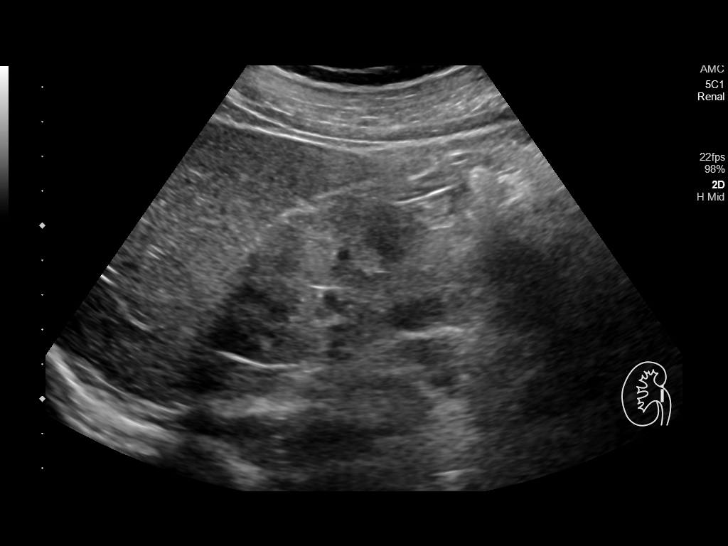
[im 11/31]
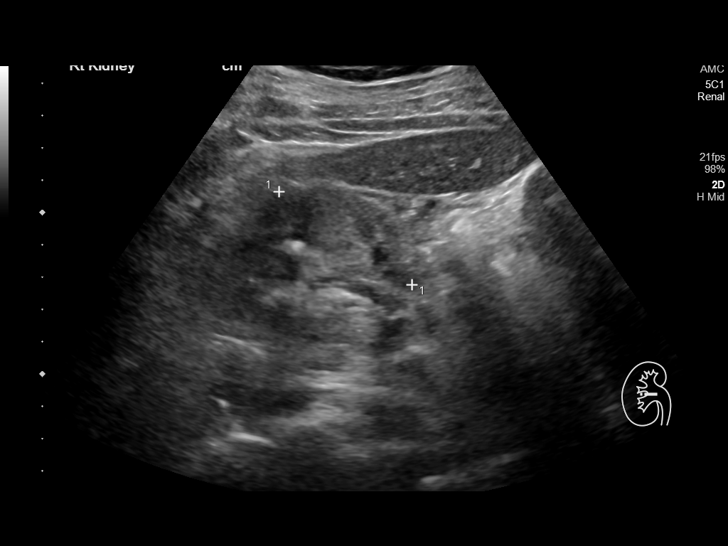
[im 12/31]
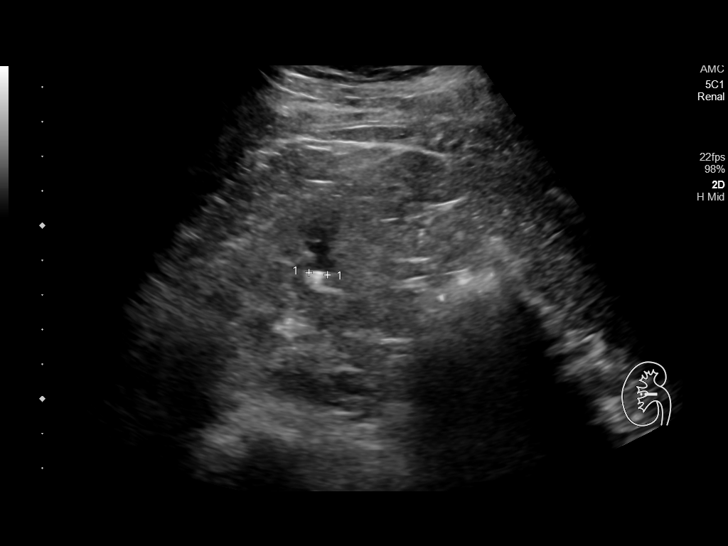
[im 14/31]
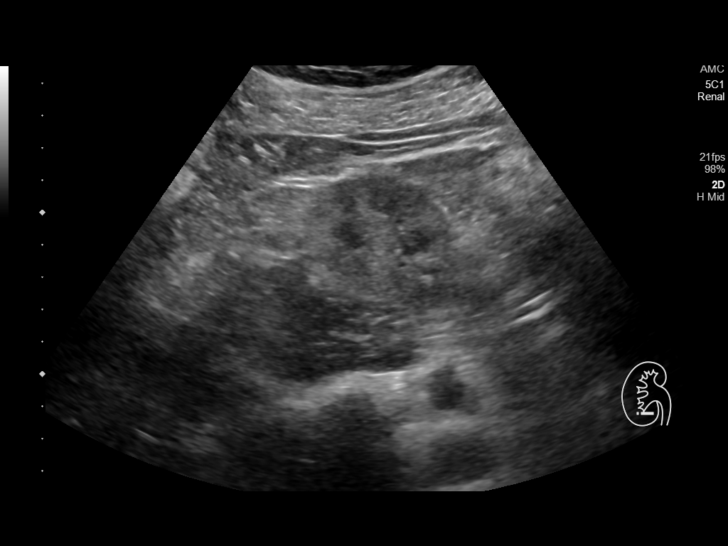
[im 17/31]
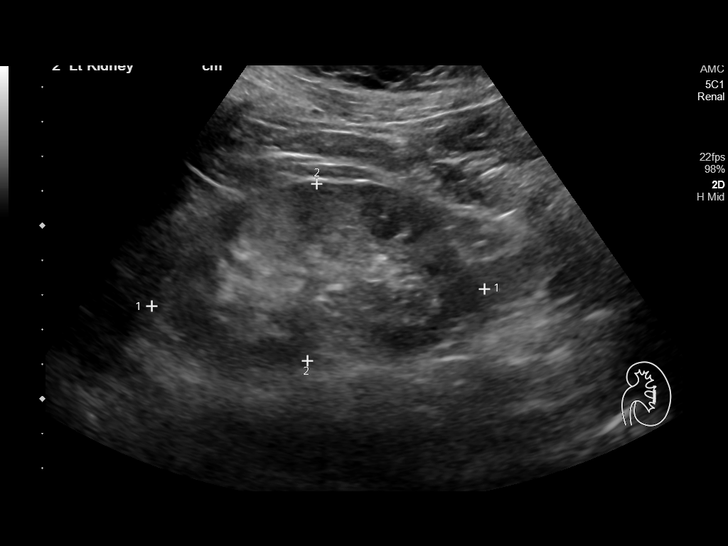
[im 19/31]
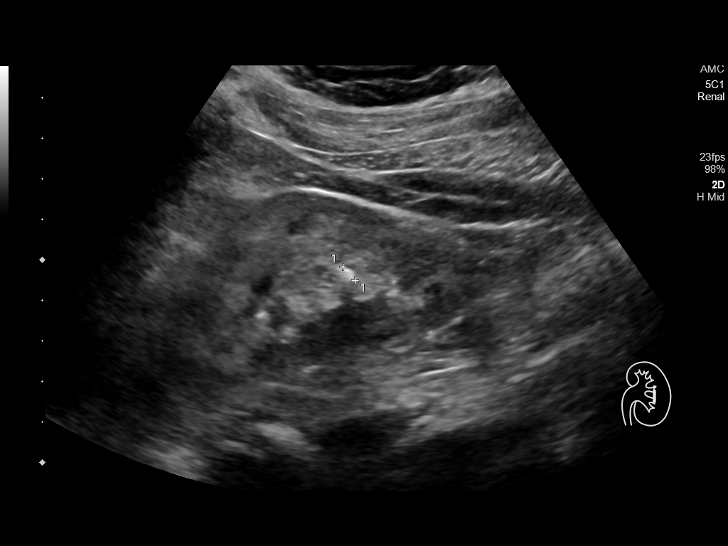
[im 21/31]
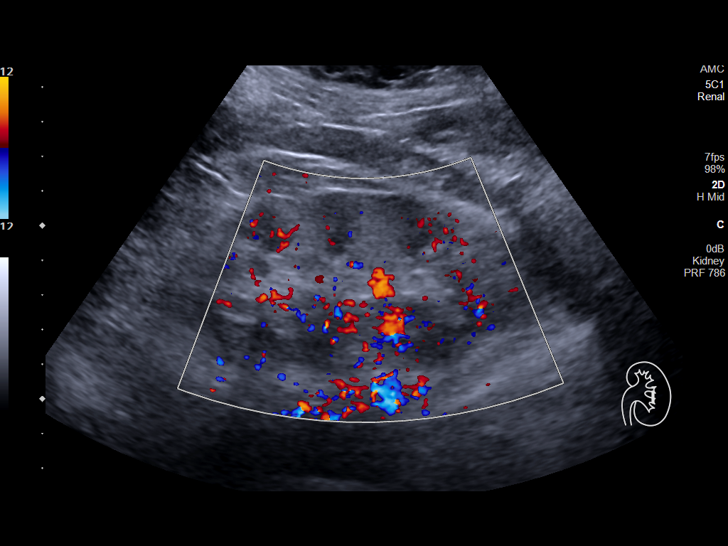
[im 23/31]
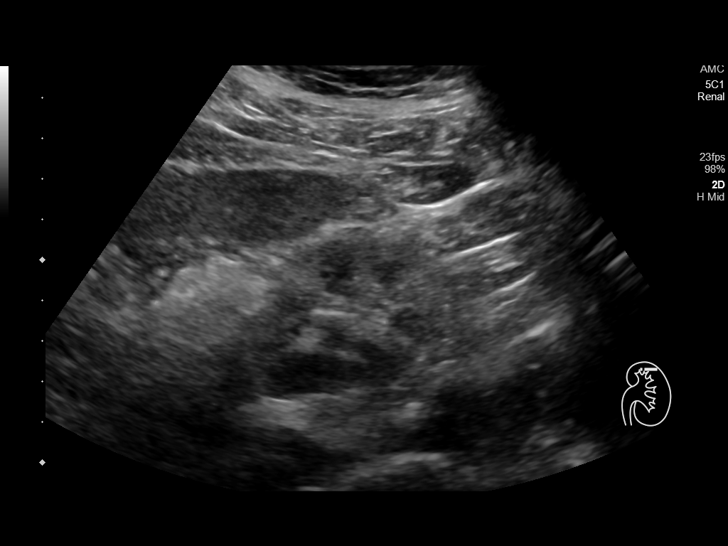
[im 26/31]
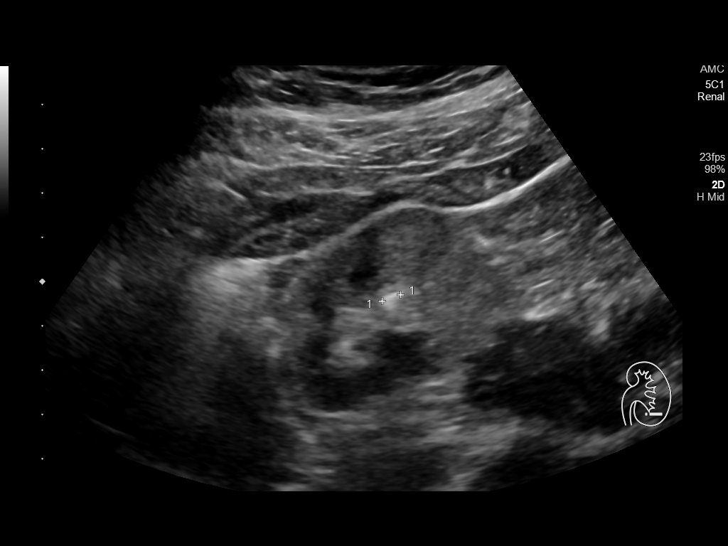
[im 28/31]
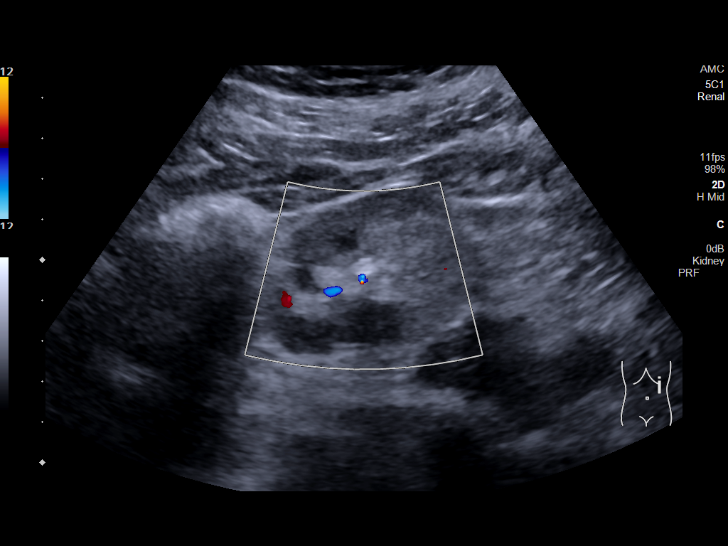
[im 31/31]
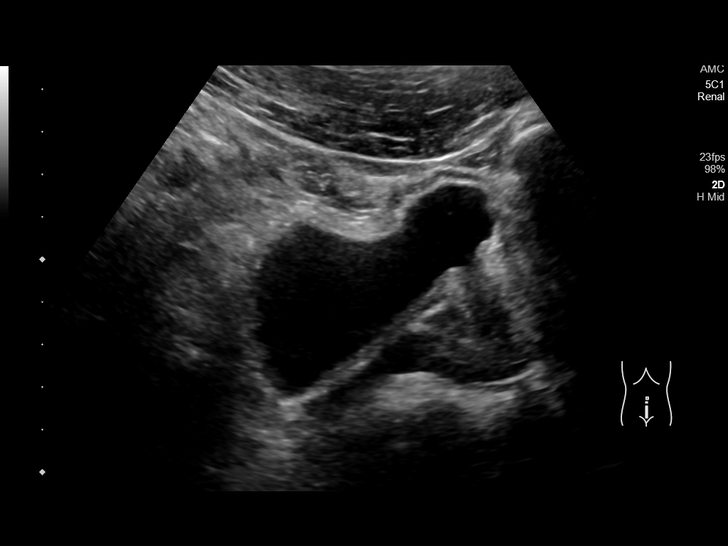

[14 of 25 positions shown; findings below may reference images not displayed]

FINDINGS: Right Kidney:

Renal measurements: 9.6 x 4.9 x 5.0 cm = volume: 124 mL. Diffusely
increased echogenicity seen within the renal parenchyma. 6 mm
nonobstructive stone present at the upper pole. No hydronephrosis.
No focal renal mass.

Left Kidney:

Renal measurements: 9.6 x 5.1 x 4.4 cm = volume: 113 mL. Diffusely
increased echogenicity seen within the renal parenchyma. 5 mm
nonobstructive stone present at the interpolar region. No
hydronephrosis. No focal renal mass.

Bladder:

Appears normal for degree of bladder distention.

Other:

None.
IMPRESSION: 1. Increased echogenicity within the renal parenchyma, compatible
with medical renal disease.
2. Bilateral nonobstructive nephrolithiasis as above. No
hydronephrosis.

## 2021-10-02 IMAGING — CR DG CHEST 2V
1 series · 2 of 2 positions shown · non-contrast
Comparison: None.

CLINICAL DATA: Hypertension

EXAM:
CHEST - 2 VIEW

[Series 1: dg chest 2 view · 0.14mm/px · 2 of 2 slices shown]
[im 1/2]
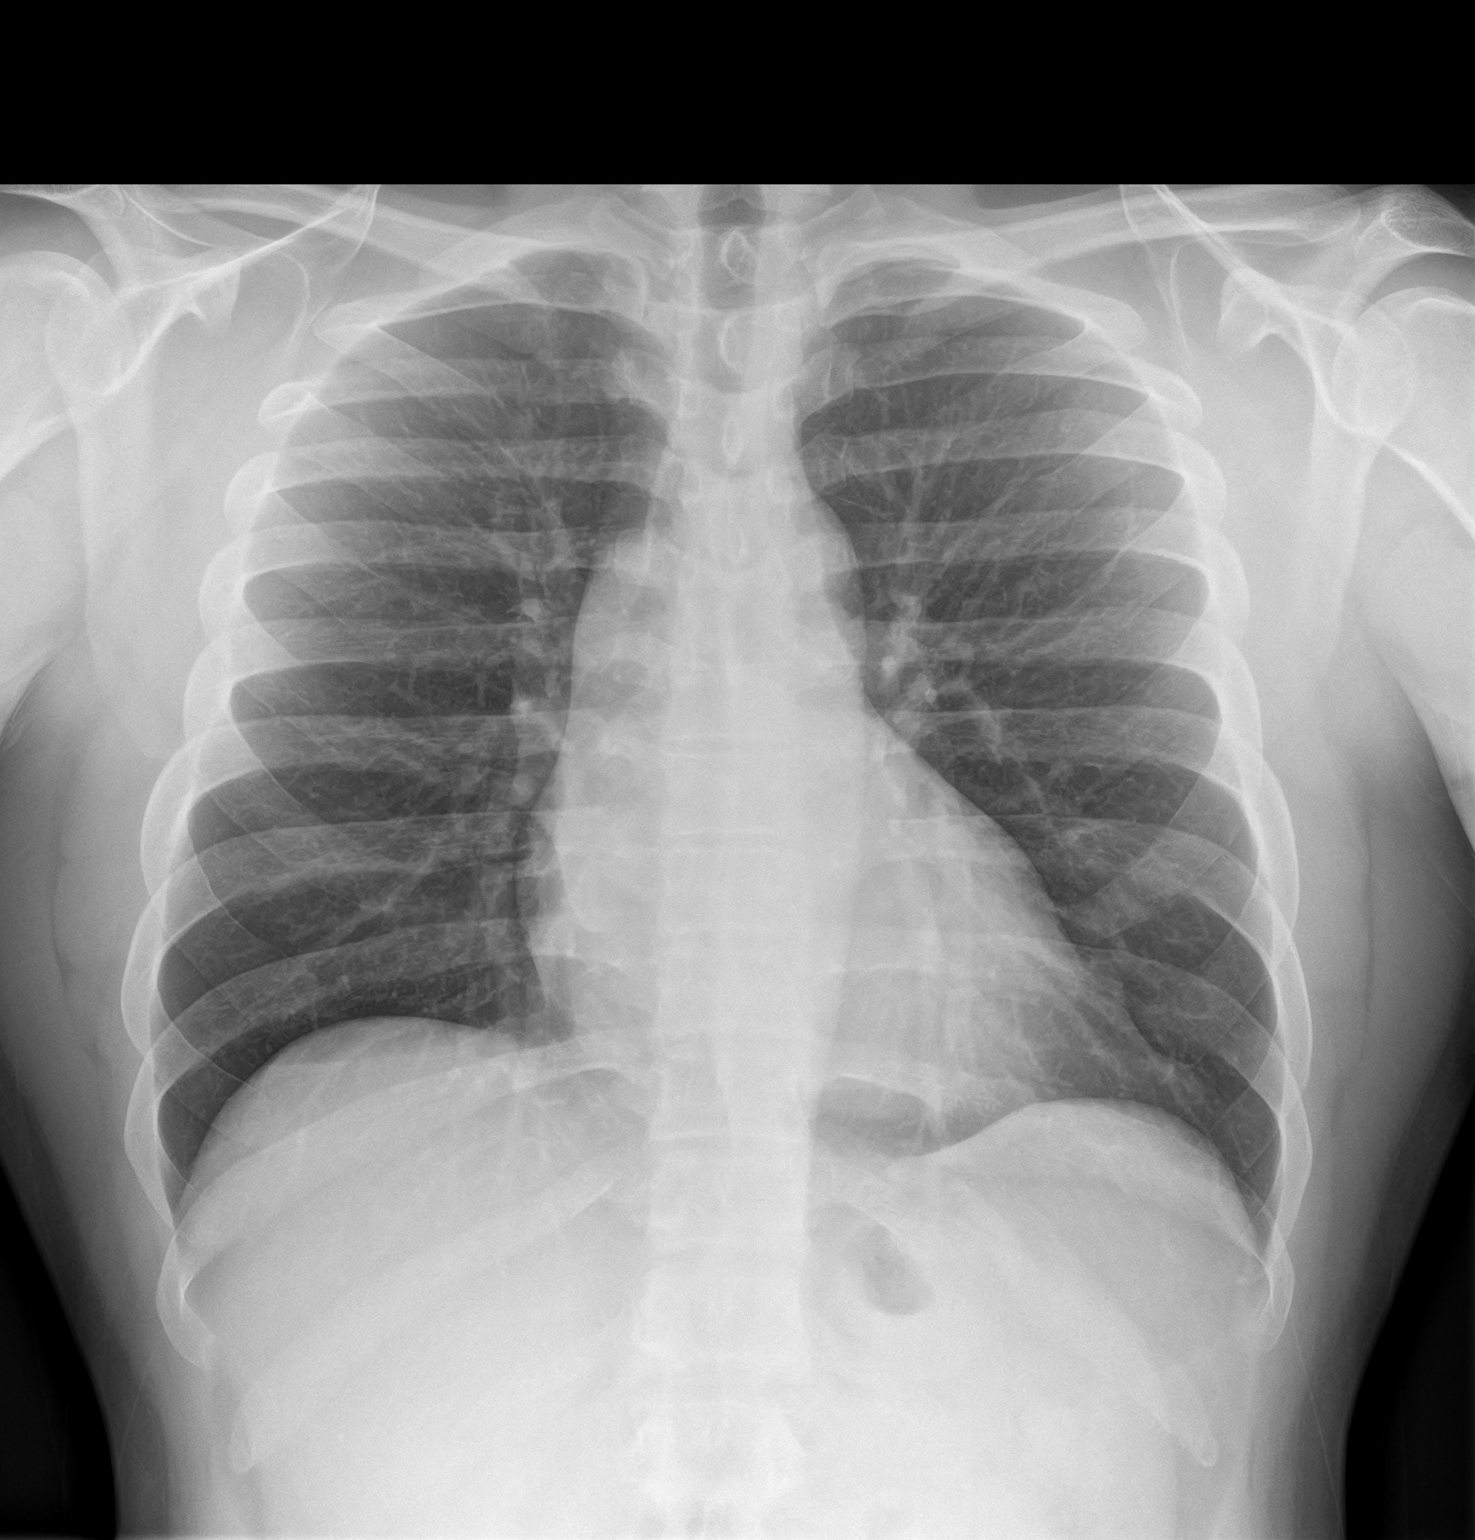
[im 2/2]
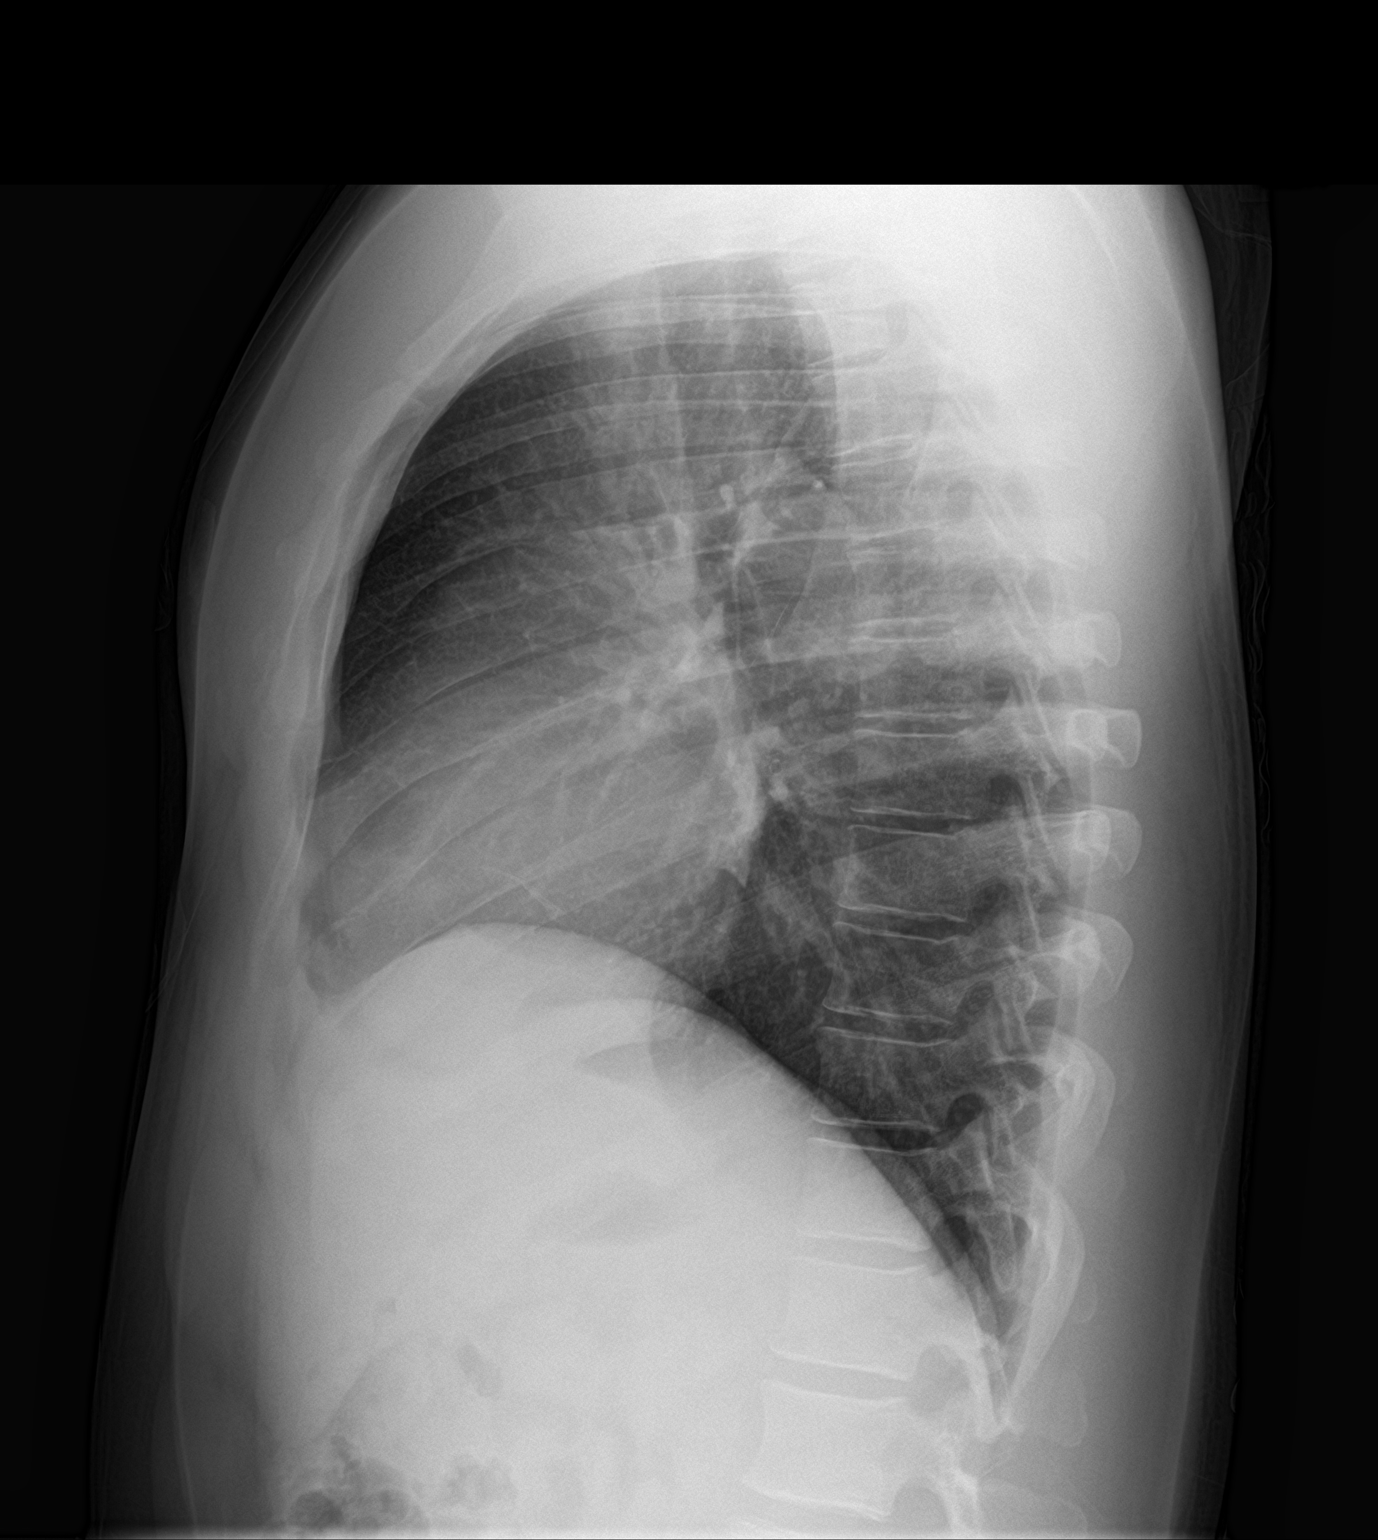

[2 of 2 positions shown; findings below may reference images not displayed]

FINDINGS: The heart size and mediastinal contours are within normal limits.
Both lungs are clear. The visualized skeletal structures are
unremarkable.
IMPRESSION: Normal study.

## 2021-10-23 DIAGNOSIS — I120 Hypertensive chronic kidney disease with stage 5 chronic kidney disease or end stage renal disease: Secondary | ICD-10-CM | POA: Diagnosis not present

## 2021-10-23 DIAGNOSIS — Z23 Encounter for immunization: Secondary | ICD-10-CM | POA: Diagnosis not present

## 2021-10-23 DIAGNOSIS — J029 Acute pharyngitis, unspecified: Secondary | ICD-10-CM | POA: Diagnosis not present

## 2021-12-14 ENCOUNTER — Encounter (INDEPENDENT_AMBULATORY_CARE_PROVIDER_SITE_OTHER): Payer: Self-pay

## 2022-01-20 DIAGNOSIS — I10 Essential (primary) hypertension: Secondary | ICD-10-CM | POA: Diagnosis not present

## 2022-01-20 DIAGNOSIS — N2581 Secondary hyperparathyroidism of renal origin: Secondary | ICD-10-CM | POA: Diagnosis not present

## 2022-01-20 DIAGNOSIS — R809 Proteinuria, unspecified: Secondary | ICD-10-CM | POA: Diagnosis not present

## 2022-01-20 DIAGNOSIS — N1832 Chronic kidney disease, stage 3b: Secondary | ICD-10-CM | POA: Diagnosis not present

## 2022-06-01 DIAGNOSIS — R809 Proteinuria, unspecified: Secondary | ICD-10-CM | POA: Insufficient documentation

## 2022-06-01 DIAGNOSIS — E871 Hypo-osmolality and hyponatremia: Secondary | ICD-10-CM | POA: Diagnosis not present

## 2022-06-01 DIAGNOSIS — I10 Essential (primary) hypertension: Secondary | ICD-10-CM | POA: Diagnosis not present

## 2022-06-01 DIAGNOSIS — D631 Anemia in chronic kidney disease: Secondary | ICD-10-CM | POA: Diagnosis not present

## 2022-06-01 DIAGNOSIS — N186 End stage renal disease: Secondary | ICD-10-CM | POA: Diagnosis not present

## 2022-06-01 DIAGNOSIS — I120 Hypertensive chronic kidney disease with stage 5 chronic kidney disease or end stage renal disease: Secondary | ICD-10-CM | POA: Diagnosis not present

## 2022-06-01 DIAGNOSIS — N179 Acute kidney failure, unspecified: Secondary | ICD-10-CM | POA: Diagnosis not present

## 2022-06-01 DIAGNOSIS — N2581 Secondary hyperparathyroidism of renal origin: Secondary | ICD-10-CM | POA: Diagnosis not present

## 2022-06-01 DIAGNOSIS — N1832 Chronic kidney disease, stage 3b: Secondary | ICD-10-CM | POA: Diagnosis not present

## 2022-06-01 DIAGNOSIS — N184 Chronic kidney disease, stage 4 (severe): Secondary | ICD-10-CM | POA: Diagnosis not present

## 2022-07-18 IMAGING — CT CT ABD-PELV W/ CM
2 of 5 series · 14 of 46 positions shown, 16 images · IV contrast (APPLIED)
Comparison: CT the abdomen and pelvis 07/31/2014.

CLINICAL DATA: 34-year-old male with history of generalized
abdominal pain with nausea and vomiting. Suspected bowel
obstruction.

EXAM:
CT ABDOMEN AND PELVIS WITH CONTRAST
TECHNIQUE: Multidetector CT imaging of the abdomen and pelvis was performed
using the standard protocol following bolus administration of
intravenous contrast.
CONTRAST:  100mL OMNIPAQUE IOHEXOL 300 MG/ML  SOLN

[Series 4: routine abd/pel with · axial · 0.71mm/px · z∈[-477,-42]mm · 11 of 101 slices shown, 13 images]
[im 9/101  soft-tissue]
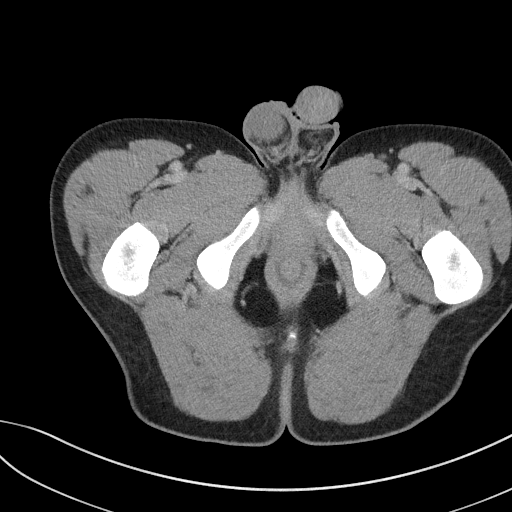
[im 9/101  bone]
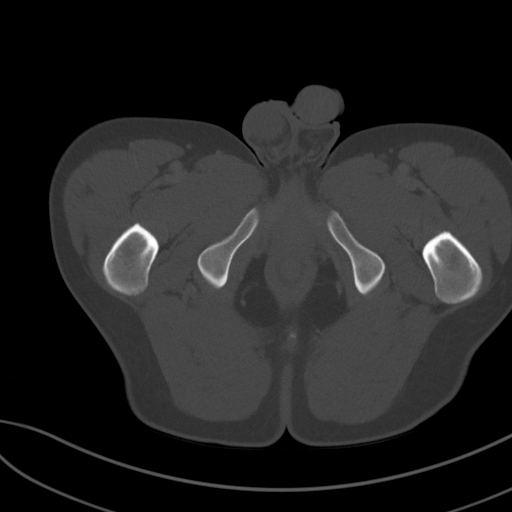
[im 18/101  soft-tissue]
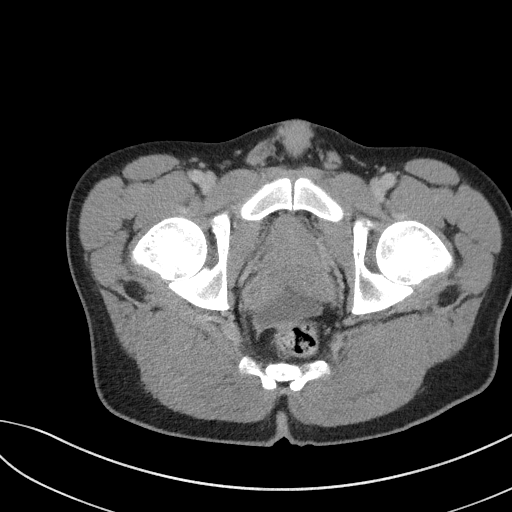
[im 27/101  soft-tissue]
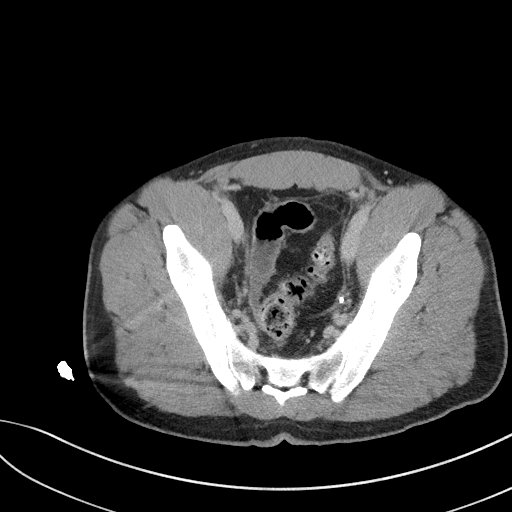
[im 35/101  soft-tissue]
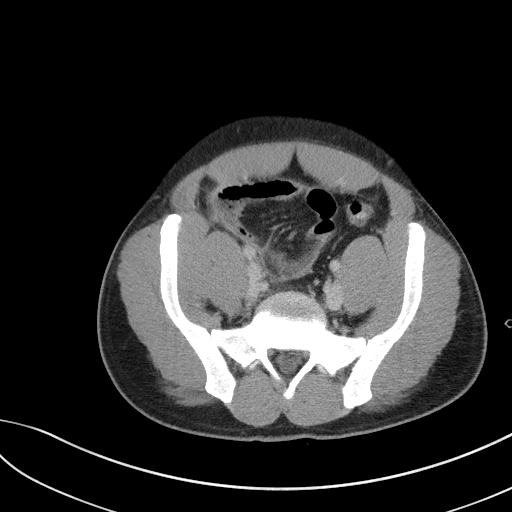
[im 44/101  soft-tissue]
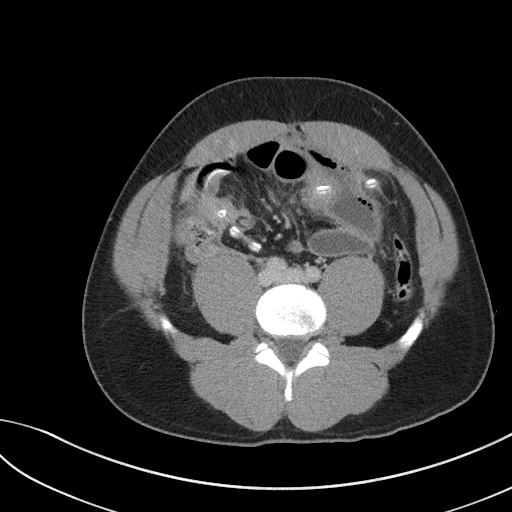
[im 53/101  soft-tissue]
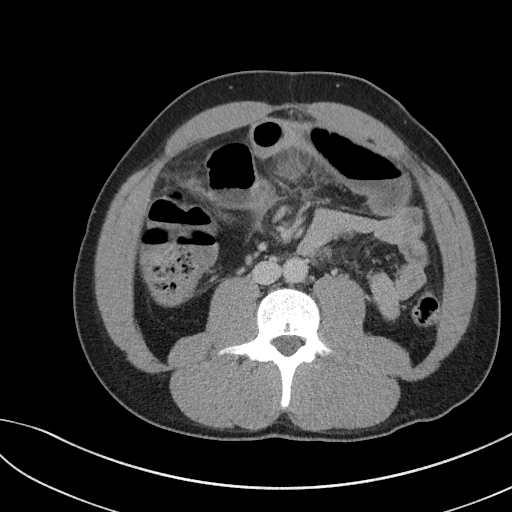
[im 61/101  soft-tissue]
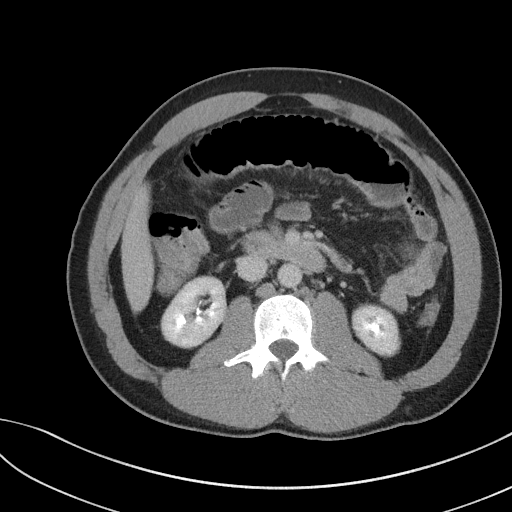
[im 70/101  soft-tissue]
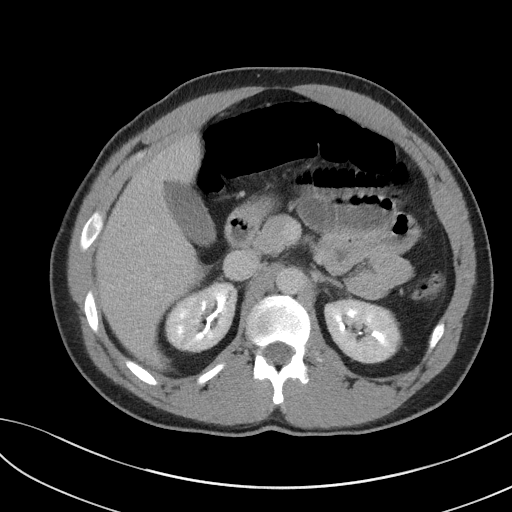
[im 79/101  soft-tissue]
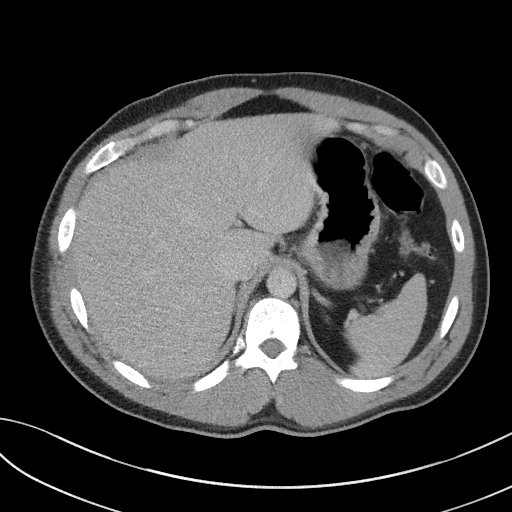
[im 79/101  bone]
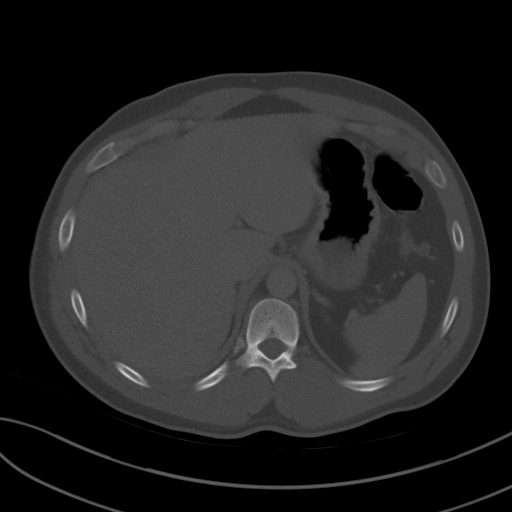
[im 87/101  soft-tissue]
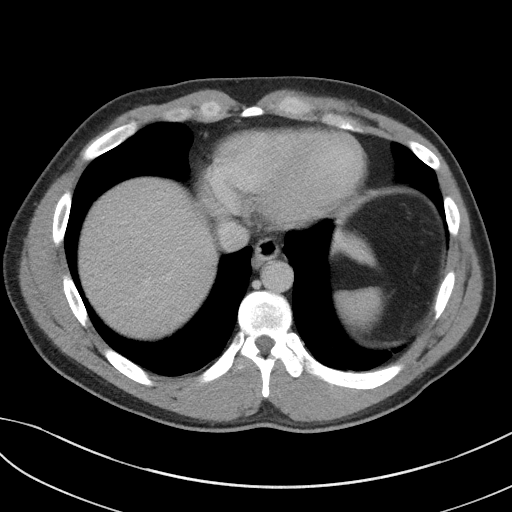
[im 96/101  soft-tissue]
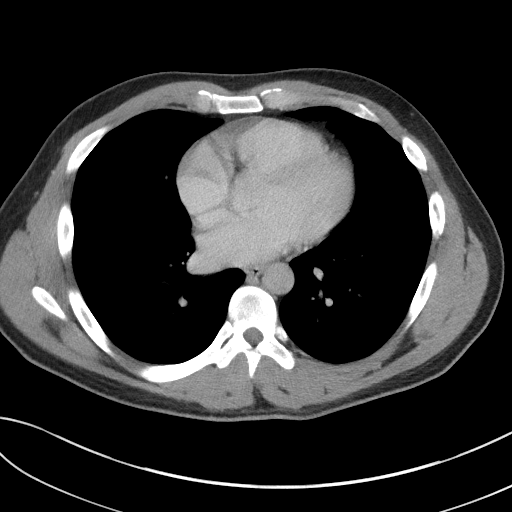

[Series 7: coronal st · coronal · 0.69mm/px · 3 of 94 slices shown]
[im 32/94  soft-tissue]
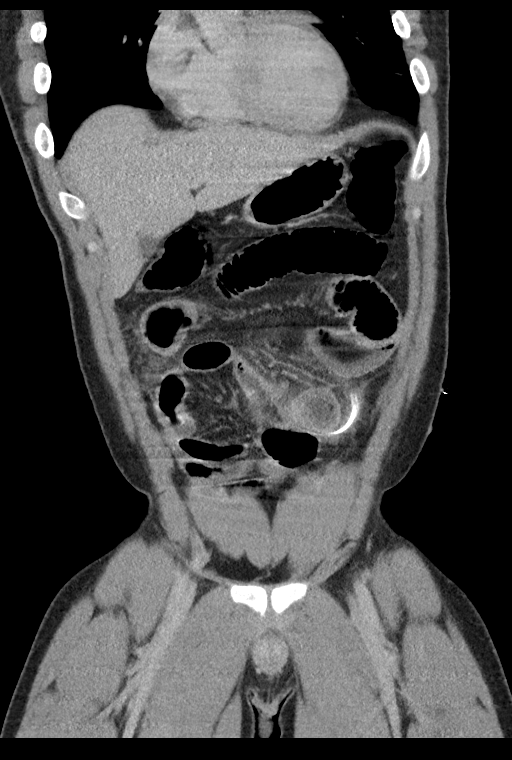
[im 42/94  soft-tissue]
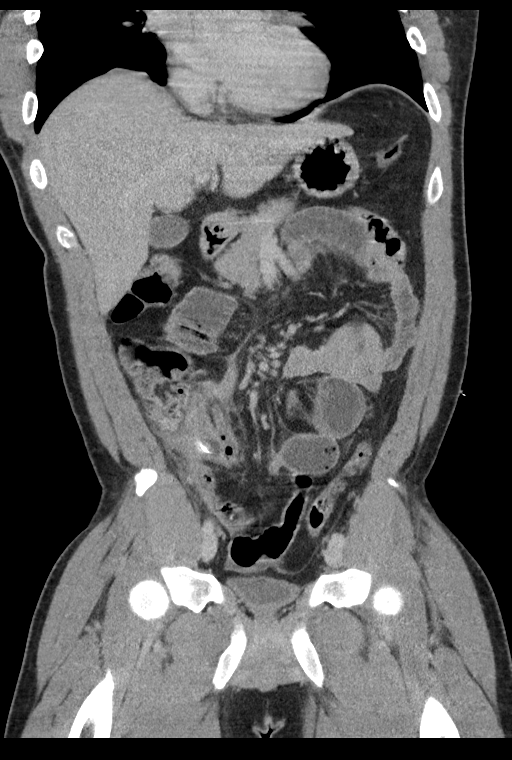
[im 52/94  soft-tissue]
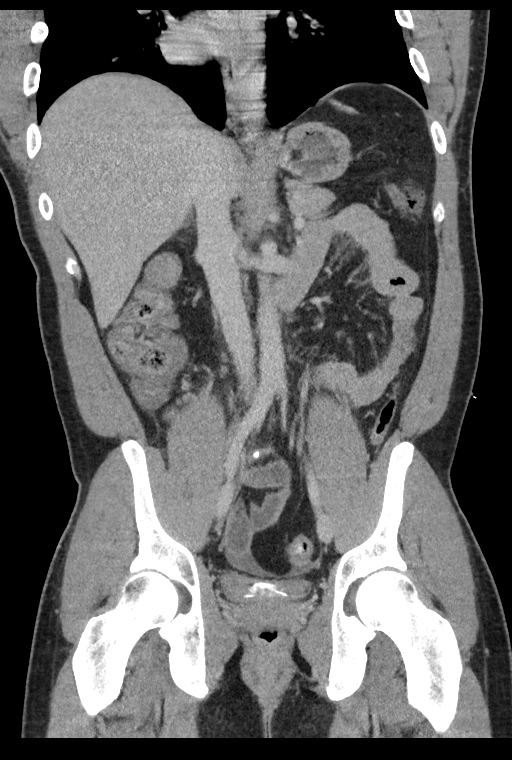

[14 of 46 positions shown; findings below may reference images not displayed]

FINDINGS: Lower chest: Unremarkable.

Hepatobiliary: No suspicious cystic or solid hepatic lesions. No
intra or extrahepatic biliary ductal dilatation. Gallbladder is
normal in appearance.

Pancreas: No pancreatic mass. No pancreatic ductal dilatation. No
pancreatic or peripancreatic fluid collections or inflammatory
changes.

Spleen: Unremarkable.

Adrenals/Urinary Tract: Bilateral kidneys and adrenal glands are
normal in appearance. No hydroureteronephrosis. Urinary bladder is
nearly completely decompressed, but otherwise unremarkable in
appearance.

Stomach/Bowel: The appearance of the stomach is normal. Proximal
small bowel is decompressed. The mid small bowel is dilated with
several air-fluid levels, measuring up to 4.6 cm in diameter. Distal
small bowel is also completely decompressed. Small amount of gas and
stool noted throughout the colon and rectum. Appendix is normal in
diameter, without surrounding inflammatory changes. In the tip of
the appendix is a small 4 mm appendicoliths.

Vascular/Lymphatic: No significant atherosclerotic disease, aneurysm
or dissection noted in the abdominal or pelvic vasculature. No
lymphadenopathy noted in the abdomen or pelvis.

Reproductive: Prostate gland and seminal vesicles are unremarkable
in appearance.

Other: Tenckhoff dialysis catheter with tip in the right lower
quadrant. Small volume of fluid in the low anatomic pelvis,
presumably peritoneal dialysate. There does appear to be some
thickening and mild enhancement of peritoneal membranes, which may
suggest peritonitis. Slight haziness is also noted in the small
bowel mesentery, most evident associated with the dilated loops of
small bowel. No pneumoperitoneum.

Musculoskeletal: There are no aggressive appearing lytic or blastic
lesions noted in the visualized portions of the skeleton.
IMPRESSION: 1. Dilatation of mid small bowel loops, which could be indicative of
early or partial small bowel obstruction. However, given the
presence of some peritoneal thickening and enhancement and haziness
in the fat of the small bowel mesentery associated with these
dilated loops of bowel, the possibility of peritonitis and regional
small bowel ileus also warrants consideration.
2. Additional incidental findings, as above.

## 2022-07-30 DIAGNOSIS — D631 Anemia in chronic kidney disease: Secondary | ICD-10-CM | POA: Diagnosis not present

## 2022-07-30 DIAGNOSIS — I10 Essential (primary) hypertension: Secondary | ICD-10-CM | POA: Diagnosis not present

## 2022-07-30 DIAGNOSIS — N186 End stage renal disease: Secondary | ICD-10-CM | POA: Diagnosis not present

## 2022-07-30 DIAGNOSIS — Z0000 Encounter for general adult medical examination without abnormal findings: Secondary | ICD-10-CM | POA: Diagnosis not present

## 2022-07-30 DIAGNOSIS — N2581 Secondary hyperparathyroidism of renal origin: Secondary | ICD-10-CM | POA: Diagnosis not present

## 2022-07-30 DIAGNOSIS — N189 Chronic kidney disease, unspecified: Secondary | ICD-10-CM | POA: Diagnosis not present

## 2022-08-02 ENCOUNTER — Ambulatory Visit: Payer: Self-pay | Admitting: Physician Assistant

## 2022-08-02 VITALS — BP 160/90 | HR 52 | Temp 98.2°F | Resp 16

## 2022-08-02 DIAGNOSIS — R062 Wheezing: Secondary | ICD-10-CM

## 2022-08-02 DIAGNOSIS — R051 Acute cough: Secondary | ICD-10-CM

## 2022-08-02 MED ORDER — ALBUTEROL SULFATE HFA 108 (90 BASE) MCG/ACT IN AERS
2.0000 | INHALATION_SPRAY | Freq: Four times a day (QID) | RESPIRATORY_TRACT | 2 refills | Status: DC | PRN
Start: 2022-08-02 — End: 2023-07-28

## 2022-08-02 MED ORDER — ALBUTEROL SULFATE (2.5 MG/3ML) 0.083% IN NEBU
2.5000 mg | INHALATION_SOLUTION | Freq: Four times a day (QID) | RESPIRATORY_TRACT | 1 refills | Status: DC | PRN
Start: 2022-08-02 — End: 2022-08-02

## 2022-08-02 NOTE — Progress Notes (Addendum)
[facility]  ____________________________________________  No chief complaint on file.    I have reviewed the triage vital signs and the nursing notes:   Day 2 stated cold/chest symptom.  Denies fever and stated productive cough at times.  Taking PO well.  Observed with deep breath wheezing with exhalation.  Stated hasn't taken BP meds yet today and has PRN Lasix.       HISTORY  Chief Complaint No chief complaint on file.    HPI Jeffrey Maynard is a 37 y.o. male that presents today with concern for cold.  No recent fever.  No recent sick contacts.  He reports a productive cough at times of clear phlegm, though at other times his cough is dry.  Cough does not become worse when laying flat.  No sore throat.  No other symptoms concerning for viral infection.  He does not smoke, though he does report he is chronically around this that do smoke.   He does not have a history of asthma though montelukast is listed on his past medications list. He is followed by nephrology/Dr. Wynelle Link with medications including amlodipine, carvedilol, furosemide, and irbesartan for hypertension.  He has not been weighing himself daily.  BP today elevated, though he reports he has not taken any of his medications.   Recent blood work does not show elevated total WBC and in fact low, though do show elevated monocytes.    Allergies  No Known Allergies   Past Medical History:  Diagnosis Date   Hypertension    Past Surgical History:  Procedure Laterality Date   CAPD INSERTION N/A 05/24/2019   Procedure: LAPAROSCOPIC INSERTION CONTINUOUS AMBULATORY PERITONEAL DIALYSIS  (CAPD) CATHETER;  Surgeon: Annice Needy, MD;  Location: [facility] ORS;  Service: General;  Laterality: N/A;   XI ROBOT ASSISTED DIAGNOSTIC LAPAROSCOPY N/A 03/07/2020   Procedure: XI ROBOT ASSISTED DIAGNOSTIC LAPAROSCOPY-Catheter Removal;  Surgeon: Leafy Ro, MD;  Location: [facility] OR[facility];  Service: General;   Laterality: N/A;      Medications Current Outpatient Medications  Medication Sig Dispense Refill   albuterol (VENTOLIN HFA) 108 (90 Base) MCG/ACT inhaler Inhale 2 puffs into the lungs every 6 (six) hours as needed for wheezing or shortness of breath. Inhale 2 puffs into the lungs every 6 (six) hours as needed for wheezing, shortness of breath, or cough. 8 g 2   amLODipine (NORVASC) 10 MG tablet Take 1 tablet (10 mg total) by mouth daily. 30 tablet 1   carvedilol (COREG) 25 MG tablet Take 1 tablet (25 mg total) by mouth 2 (two) times daily with a meal. 60 tablet 1   irbesartan (AVAPRO) 150 MG tablet Take 150 mg by mouth every evening.     furosemide (LASIX) 20 MG tablet Take 20 mg by mouth daily. (Patient not taking: Reported on 08/02/2022)     linezolid (ZYVOX) 600 MG tablet Take 1 tablet (600 mg total) by mouth 2 (two) times daily. 28 tablet 0   No current facility-administered medications for this visit.      Family History  Problem Relation Age of Onset   COPD Mother    He indicated that his mother is deceased.    Social History Social History   Socioeconomic History   Marital status: Married    Spouse name: Not on file   Number of children: Not on file   Years of education: Not on file   Highest education level: Not on file  Occupational History  Not on file  Tobacco Use   Smoking status: Never   Smokeless tobacco: Never  Substance and Sexual Activity   Alcohol use: No   Drug use: Never   Sexual activity: Not on file  Other Topics Concern   Not on file  Social History Narrative   Not on file   Social Determinants of Health   Financial Resource Strain: Not on file  Food Insecurity: Not on file  Transportation Needs: Not on file  Physical Activity: Not on file  Stress: Not on file  Social Connections: Not on file  Intimate Partner Violence: Not on file      ROS negative unless otherwise indicated  above  ____________________________________________   PHYSICAL EXAM: Vitals:   08/02/22 0848  BP: (!) 160/90  Pulse: (!) 52  Resp: 16  Temp: 98.2 F (36.8 C)  SpO2: 98%     Physical Exam Constitutional:      Appearance: Normal appearance.  HENT:     Head: Normocephalic and atraumatic.     Right Ear: Tympanic membrane, ear canal and external ear normal. There is no impacted cerumen.     Left Ear: Tympanic membrane, ear canal and external ear normal. There is no impacted cerumen.     Nose: Nose normal. No congestion or rhinorrhea.     Mouth/Throat:     Mouth: Mucous membranes are moist.     Pharynx: No oropharyngeal exudate or posterior oropharyngeal erythema.  Eyes:     Conjunctiva/sclera: Conjunctivae normal.  Cardiovascular:     Rate and Rhythm: Regular rhythm. Bradycardia present.  Pulmonary:     Effort: Pulmonary effort is normal. No tachypnea, bradypnea or accessory muscle usage.     Breath sounds: Examination of the right-upper field reveals wheezing. Examination of the left-upper field reveals wheezing. Examination of the right-middle field reveals wheezing. Examination of the left-middle field reveals wheezing. Wheezing present. No rhonchi or rales.     Comments: Expiratory wheeze Abdominal:     General: Abdomen is flat.  Skin:    General: Skin is warm and dry.  Neurological:     Mental Status: He is alert.  Psychiatric:        Mood and Affect: Mood normal.        Behavior: Behavior normal.        Thought Content: Thought content normal.        Judgment: Judgment normal.      ____________________________________________   LABS No results found for this or any previous visit (from the past 2160 hour(s)).   CBC 07/30/22 Specimen: Blood Component Ref Range & Units 3 d ago  WBC (White Blood Cell Count) 4.1 - 10.2 10^3/uL 4.0 Low   RBC (Red Blood Cell Count) 4.69 - 6.13 10^6/uL 4.46 Low   Hemoglobin 14.1 - 18.1 gm/dL 16.1  Hematocrit 09.6 - 52.0 %  41.9  MCV (Mean Corpuscular Volume) 80.0 - 100.0 fl 93.9  MCH (Mean Corpuscular Hemoglobin) 27.0 - 31.2 pg 31.8 High   MCHC (Mean Corpuscular Hemoglobin Concentration) 32.0 - 36.0 gm/dL 04.5  Platelet Count 409 - 450 10^3/uL 239  RDW-CV (Red Cell Distribution Width) 11.6 - 14.8 % 11.4 Low   MPV (Mean Platelet Volume) 9.4 - 12.4 fl 9.5  Neutrophils 1.50 - 7.80 10^3/uL 1.65  Lymphocytes 1.00 - 3.60 10^3/uL 1.48  Monocytes 0.00 - 1.50 10^3/uL 0.65  Eosinophils 0.00 - 0.55 10^3/uL 0.18  Basophils 0.00 - 0.09 10^3/uL 0.02  Neutrophil % 32.0 - 70.0 % 41.2  Lymphocyte %  10.0 - 50.0 % 37.0  Monocyte % 4.0 - 13.0 % 16.3 High   Eosinophil % 1.0 - 5.0 % 4.5  Basophil% 0.0 - 2.0 % 0.5  Immature Granulocyte % <=0.7 % 0.5  Immature Granulocyte Count <=0.06 10^3/L 0.02   CMP Component Ref Range & Units 3 d ago Comments  Glucose 70 - 110 mg/dL 96   Sodium 161 - 096 mmol/L 138   Potassium 3.6 - 5.1 mmol/L 4.3   Chloride 97 - 109 mmol/L 105   Carbon Dioxide (CO2) 22.0 - 32.0 mmol/L 28.2   Urea Nitrogen (BUN) 7 - 25 mg/dL 34 High    Creatinine 0.7 - 1.3 mg/dL 2.2 High    Glomerular Filtration Rate (eGFR) >60 mL/min/1.73sq m 39 Low  CKD-EPI (2021) does not include patient's race in the calculation of eGFR.  Monitoring changes of plasma creatinine and eGFR over time is useful for monitoring kidney function.  Interpretive Ranges for eGFR (CKD-EPI 2021):  eGFR:       >60 mL/min/1.73 sq. m - Normal eGFR:       30-59 mL/min/1.73 sq. m - Moderately Decreased eGFR:       15-29 mL/min/1.73 sq. m  - Severely Decreased eGFR:       < 15 mL/min/1.73 sq. m  - Kidney Failure   Note: These eGFR calculations do not apply in acute situations when eGFR is changing rapidly or patients on dialysis.  Calcium 8.7 - 10.3 mg/dL 8.8   AST 8 - 39 U/L 14   ALT 6 - 57 U/L 11   Alk Phos (alkaline Phosphatase) 34 - 104 U/L 63   Albumin 3.5 - 4.8 g/dL 4.2   Bilirubin, Total 0.3 - 1.2  mg/dL 0.8   Protein, Total 6.1 - 7.9 g/dL 6.8   A/G Ratio 1.0 - 5.0 gm/dL 1.6   Resulting Agency [facility]    07/30/22 A1c 4.7 LDL 90, total cholesterol 045, triglycerides 60 TSH 2.772  06/01/22 Component 06/01/22 01/14/21 09/08/20 07/23/20 05/21/20 04/16/20  Color, Urine YELLOW YELLOW YELLOW YELLOW YELLOW YELLOW  Appearance Urine CLEAR CLEAR CLEAR CLEAR CLEAR CLEAR  Specific Gravity, UA 1.017 1.009 1.003 1.004 1.003 1.005  pH Urine 5.5 < OR = 5.0 5.5 6.0 6.0 < OR = 5.0  Glucose, Ur NEGATIVE NEGATIVE NEGATIVE NEGATIVE NEGATIVE NEGATIVE  Ketones, Urine NEGATIVE NEGATIVE NEGATIVE NEGATIVE NEGATIVE NEGATIVE  Hemoglobin Ur Ql Strip 1+ Abnormal  NEGATIVE NEGATIVE NEGATIVE NEGATIVE NEGATIVE  Protein, Ur 2+ Abnormal  NEGATIVE NEGATIVE NEGATIVE NEGATIVE NEGATIVE   ____________________________________________  EKG Orders placed or performed during the hospital encounter of 05/21/19   EKG 12-Lead   EKG 12-Lead   ED EKG   ED EKG   EKG    ____________________________________________   FINAL CLINICAL IMPRESSION  1. Expiratory wheezing, Possible Asthma - 3 days of at times productive cough with clear sputum and at times dry, no orthopnea / LEE. Not volume up on exam (does have PRN lasix) despite elevated BP (see recent nephrology note) and no s/sx of overload. He denies any sick contacts, recent / current fever, +/- additional s/sx of viral infection with only 3 days total of sx and suspicion for bacterial infection very low based on amount of time of sx and overall HPI/PMH -- Discussed abx not appropriate with pt understanding. Expiratory wheeze on exam, suggestive of constriction or obstruction of airway. No rhonchi, no crackles. Recent CBC with low total WBC, though noted elevated monocytes, which can occur prior to  acute asthma attack. Does have history of COVID and pt is around smokers (though not one himself). Previous rx history includes montelukast, though I am unable  to find an documentation regarding the indication for the Singulair, and he no longer appears to be taking this medication. Will prescribe PRN albuterol for acute attacks / as an emergency inhaler. Given the diffuse expiratory wheeze is primarily located in the upper lobes (rather than diffuse wheezing), offered an xray to rule out any other potential causes and as cannot rule out additional etiologies / ddx such as a mass for the wheezing; however, the pt politely declined. Consider pulmonary follow-up / CXR in the very near future, as he would benefit from ruling out a pulmonary mass or alternative etiology for constriction / obstruction and also benefit for follow-up for official dx and long term tx if asthma - if inhaler helpful and Xray without acute findings, he may benefit from a chronic inhaler / asthma treatment in addition to a rescue inhaler, as albuterol is not meant to be a long term inhaler / preventative. Given elevated BP, recommend taking his medications as prescribed and monitoring BP closely when taking albuterol.   --> PLAN: PRN albuterol (VENTOLIN HFA) 108 (90 Base) MCG/ACT inhaler for attacks; Inhale 2 puffs into the lungs every 6 (six) hours as needed for wheezing or shortness of breath. Inhale 2 puffs into the lungs every 6 (six) hours as needed for wheezing, shortness of breath, or cough.  Dispense: 8 g; Refill: 2    Note:  This document was prepared using Dragon voice recognition software and may include unintentional dictation errors.

## 2022-08-02 NOTE — Progress Notes (Signed)
Day 2 stated cold/chest symptom.  Denies fever and stated productive cough at times.  Taking PO well.  Observed with deep breath wheezing with exhalation.  Stated hasn't taken BP meds yet today and has PRN Lasix.

## 2022-08-20 ENCOUNTER — Ambulatory Visit: Payer: Self-pay

## 2022-08-20 DIAGNOSIS — Z0000 Encounter for general adult medical examination without abnormal findings: Secondary | ICD-10-CM

## 2022-08-20 NOTE — Progress Notes (Signed)
Presents to [facility] [employer] for Annual Hearing Screen.  Works in [employer] at [employer].  The department is part of the [employer].  AMD

## 2022-09-08 ENCOUNTER — Other Ambulatory Visit: Payer: Self-pay

## 2022-09-08 DIAGNOSIS — R809 Proteinuria, unspecified: Secondary | ICD-10-CM | POA: Diagnosis not present

## 2022-09-08 DIAGNOSIS — D631 Anemia in chronic kidney disease: Secondary | ICD-10-CM | POA: Diagnosis not present

## 2022-09-08 DIAGNOSIS — N2581 Secondary hyperparathyroidism of renal origin: Secondary | ICD-10-CM | POA: Diagnosis not present

## 2022-09-08 DIAGNOSIS — I120 Hypertensive chronic kidney disease with stage 5 chronic kidney disease or end stage renal disease: Secondary | ICD-10-CM | POA: Diagnosis not present

## 2022-09-08 DIAGNOSIS — N1832 Chronic kidney disease, stage 3b: Secondary | ICD-10-CM | POA: Diagnosis not present

## 2022-09-08 DIAGNOSIS — Z0283 Encounter for blood-alcohol and blood-drug test: Secondary | ICD-10-CM | POA: Insufficient documentation

## 2022-09-08 DIAGNOSIS — E871 Hypo-osmolality and hyponatremia: Secondary | ICD-10-CM | POA: Diagnosis not present

## 2022-09-08 NOTE — Progress Notes (Signed)
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  [facility] Acct #:  [id number] [facility] Specimen #:  [id number]  Alcohol Screen Results = 0.000  AMD

## 2022-10-01 DIAGNOSIS — G4710 Hypersomnia, unspecified: Secondary | ICD-10-CM | POA: Diagnosis not present

## 2022-10-01 DIAGNOSIS — N2581 Secondary hyperparathyroidism of renal origin: Secondary | ICD-10-CM | POA: Diagnosis not present

## 2022-10-01 DIAGNOSIS — R400 Somnolence: Secondary | ICD-10-CM | POA: Diagnosis not present

## 2022-10-04 DIAGNOSIS — G4733 Obstructive sleep apnea (adult) (pediatric): Secondary | ICD-10-CM | POA: Diagnosis not present

## 2022-12-09 ENCOUNTER — Other Ambulatory Visit: Payer: Self-pay | Admitting: Physician Assistant

## 2022-12-09 ENCOUNTER — Ambulatory Visit: Payer: Self-pay | Admitting: Physician Assistant

## 2022-12-09 ENCOUNTER — Encounter: Payer: Self-pay | Admitting: Physician Assistant

## 2022-12-09 VITALS — BP 140/72 | HR 48 | Temp 97.7°F | Resp 14

## 2022-12-09 DIAGNOSIS — R001 Bradycardia, unspecified: Secondary | ICD-10-CM

## 2022-12-09 NOTE — Progress Notes (Signed)
Subjective: Vertigo and nausea    Patient ID: Jeffrey Maynard, male    DOB: [birth year], 37 y.o.   MRN: [id number]  HPI Patient presents to clinic with complaint of lightheadedness and mild nausea.  Patient denies vomiting.  Patient denies URI signs and symptoms.  Patient describes his vertigo before brought positional changes.  Denies chest pain or dyspnea.  Patient past medical history is remarkable for hypertension and stage III kidney disease.  Patient is followed by his PCP Dr. Sampson Goon.  Patient also follow up with nephrology at [facility] system.  Patient had a physical few months ago.  Per review of recent visit patient is taking Norvasc, Coreg, Lasix, and Irbesartan for hypertension.   Review of Systems Essential hypertension and chronic kidney disease.    Objective:   Physical Exam      BP 132/86 128/70 140/72  BP Location Left Arm Left Arm Left Arm  Patient Position Sitting Supine Standing  Pulse 58 50 48  Resp 14    Temp 97.7 F (36.5 C)    SpO2 95 %     Patient appears in no acute distress. HEENT was unremarkable.   Neck was soft lymphadenopathy or bruits. Lungs are clear to auscultation.  Heart is bradycardic at 52 bpm.       Assessment & Plan: Vertigo and bradycardia   Advised patient to follow-up with family doctor for further evaluation.  Advised to go to the emergency room if condition worsen before he contacts his family doctor.

## 2022-12-09 NOTE — Progress Notes (Signed)
Stated yesterday felt like his ears were popping.  Today reports "not feeling well" with lightheadedness or possible vertigo with mild nausea.  He was able to eat and drink today.  Denies headache and sore throat or any sinus pressure.  Taking meds as prescribed and follows with Dr. Sampson Goon with cardiac meds and was seen there a few months ago reportedly.

## 2022-12-14 DIAGNOSIS — R001 Bradycardia, unspecified: Secondary | ICD-10-CM | POA: Insufficient documentation

## 2022-12-14 DIAGNOSIS — G4733 Obstructive sleep apnea (adult) (pediatric): Secondary | ICD-10-CM | POA: Insufficient documentation

## 2022-12-14 DIAGNOSIS — R42 Dizziness and giddiness: Secondary | ICD-10-CM | POA: Diagnosis not present

## 2022-12-14 DIAGNOSIS — Z23 Encounter for immunization: Secondary | ICD-10-CM | POA: Diagnosis not present

## 2023-03-17 ENCOUNTER — Other Ambulatory Visit: Payer: Self-pay

## 2023-03-17 DIAGNOSIS — Z0283 Encounter for blood-alcohol and blood-drug test: Secondary | ICD-10-CM

## 2023-03-17 NOTE — Progress Notes (Signed)
Pt completed UDS, Results pending with [facility]

## 2023-04-07 ENCOUNTER — Encounter: Payer: Self-pay | Admitting: Physician Assistant

## 2023-04-07 ENCOUNTER — Ambulatory Visit: Payer: Self-pay | Admitting: Physician Assistant

## 2023-04-07 VITALS — BP 146/94 | HR 76 | Temp 98.9°F | Resp 14 | Wt 183.6 lb

## 2023-04-07 DIAGNOSIS — L240 Irritant contact dermatitis due to detergents: Secondary | ICD-10-CM

## 2023-04-07 MED ORDER — HYDROCORTISONE VALERATE 0.2 % EX CREA: 1.0000 | TOPICAL_CREAM | Freq: Two times a day (BID) | CUTANEOUS | 0 refills | Status: DC

## 2023-04-07 MED ORDER — HYDROXYZINE PAMOATE 25 MG PO CAPS
25.0000 mg | ORAL_CAPSULE | Freq: Three times a day (TID) | ORAL | 0 refills | Status: DC | PRN
Start: 2023-04-07 — End: 2023-07-28

## 2023-04-07 NOTE — Progress Notes (Signed)
Reports after eating Ramen noodles last evening, which is not new, that he started having a mild raised rash and itching on bilateral arms, spots of chest and face.  Has regular visits with nephrology due to advanced kidney disease and is unsure what he can take.  No edema reported, but noted increased weight since last visit.  Manual BP rechecked with 146/94 as automated reading was higher.  No other complaints voiced.  Denies any history of any contact dermatitis.  Stated uses same Tide.

## 2023-04-07 NOTE — Progress Notes (Signed)
   Subjective:Rash    Patient ID: Jeffrey Maynard, male    DOB: [birth year], 38 y.o.   MRN: [id number]  HPI Patient complain of rash associated with itching on arms face and legs.  Patient stated mood is secondary to a new laundry product that he purchased a few days ago.   Review of Systems Kidney disease, hypertension, thrombocytopenia, and hypokalemia.    Objective:   Physical Exam BP 146/94BP. 146/94. Data is abnormal. Taken on 04/07/23 2:44 PM  Pulse Rate 76  Temp 98.9 F (37.2 C)  Weight 183 lb 9.6 oz (83.3 kg)  Resp 14  SpO2 98 %  No acute distress.  Papular macular lesions observed on the upper extremities.  Sinus excoriation on the volar aspect of the upper extremities.  No signs symptoms secondary infection.       Assessment & Plan: Contact dermatitis

## 2023-06-22 ENCOUNTER — Other Ambulatory Visit: Payer: Self-pay

## 2023-07-22 ENCOUNTER — Ambulatory Visit: Payer: Self-pay

## 2023-07-22 DIAGNOSIS — Z0000 Encounter for general adult medical examination without abnormal findings: Secondary | ICD-10-CM

## 2023-07-22 LAB — POCT URINALYSIS DIPSTICK
Bilirubin, UA: NEGATIVE
Blood, UA: POSITIVE
Glucose, UA: NEGATIVE
Ketones, UA: NEGATIVE
Leukocytes, UA: NEGATIVE
Nitrite, UA: NEGATIVE
Protein, UA: POSITIVE — AB
Spec Grav, UA: 1.02 (ref 1.010–1.025)
Urobilinogen, UA: 0.2 U/dL
pH, UA: 6 (ref 5.0–8.0)

## 2023-07-23 LAB — CMP12+LP+TP+TSH+6AC+CBC/D/PLT
ALT: 19 IU/L (ref 0–44)
AST: 18 IU/L (ref 0–40)
Albumin: 4.2 g/dL (ref 4.1–5.1)
Alkaline Phosphatase: 58 IU/L (ref 44–121)
BUN/Creatinine Ratio: 13 (ref 9–20)
BUN: 21 mg/dL — ABNORMAL HIGH (ref 6–20)
Basophils Absolute: 0 10*3/uL (ref 0.0–0.2)
Basos: 1 %
Bilirubin Total: 0.8 mg/dL (ref 0.0–1.2)
Calcium: 8.9 mg/dL (ref 8.7–10.2)
Chloride: 104 mmol/L (ref 96–106)
Chol/HDL Ratio: 3.3 ratio (ref 0.0–5.0)
Cholesterol, Total: 151 mg/dL (ref 100–199)
Creatinine, Ser: 1.65 mg/dL — ABNORMAL HIGH (ref 0.76–1.27)
EOS (ABSOLUTE): 0.1 10*3/uL (ref 0.0–0.4)
Eos: 2 %
Estimated CHD Risk: <0.5 times avg. (ref 0.0–1.0)
Free Thyroxine Index: 2 (ref 1.2–4.9)
GGT: 16 IU/L (ref 0–65)
Globulin, Total: 2.6 g/dL (ref 1.5–4.5)
Glucose: 101 mg/dL — ABNORMAL HIGH (ref 70–99)
HDL: 46 mg/dL (ref >39)
Hematocrit: 43 % (ref 37.5–51.0)
Hemoglobin: 14.2 g/dL (ref 13.0–17.7)
Immature Grans (Abs): 0 10*3/uL (ref 0.0–0.1)
Immature Granulocytes: 0 %
Iron: 139 ug/dL (ref 38–169)
LDH: 167 IU/L (ref 121–224)
LDL Chol Calc (NIH): 90 mg/dL (ref 0–99)
Lymphocytes Absolute: 1.5 10*3/uL (ref 0.7–3.1)
Lymphs: 37 %
MCH: 32.1 pg (ref 26.6–33.0)
MCHC: 33 g/dL (ref 31.5–35.7)
MCV: 97 fL (ref 79–97)
Monocytes Absolute: 0.4 10*3/uL (ref 0.1–0.9)
Monocytes: 10 %
Neutrophils Absolute: 2 10*3/uL (ref 1.4–7.0)
Neutrophils: 49 %
Phosphorus: 2.6 mg/dL — ABNORMAL LOW (ref 2.8–4.1)
Platelets: 265 10*3/uL (ref 150–450)
Potassium: 4.3 mmol/L (ref 3.5–5.2)
RBC: 4.42 x10E6/uL (ref 4.14–5.80)
RDW: 11.8 % (ref 11.6–15.4)
Sodium: 138 mmol/L (ref 134–144)
T3 Uptake Ratio: 25 % (ref 24–39)
T4, Total: 7.8 ug/dL (ref 4.5–12.0)
TSH: 2.49 u[IU]/mL (ref 0.450–4.500)
Total Protein: 6.8 g/dL (ref 6.0–8.5)
Triglycerides: 79 mg/dL (ref 0–149)
Uric Acid: 5.8 mg/dL (ref 3.8–8.4)
VLDL Cholesterol Cal: 15 mg/dL (ref 5–40)
WBC: 4.1 10*3/uL (ref 3.4–10.8)
eGFR: 55 mL/min/{1.73_m2} — ABNORMAL LOW (ref >59)

## 2023-07-28 ENCOUNTER — Ambulatory Visit: Payer: Self-pay | Admitting: Physician Assistant

## 2023-07-28 ENCOUNTER — Encounter: Payer: Self-pay | Admitting: Physician Assistant

## 2023-07-28 VITALS — BP 134/80 | HR 58 | Resp 14 | Ht 66.0 in | Wt 185.0 lb

## 2023-07-28 DIAGNOSIS — Z0000 Encounter for general adult medical examination without abnormal findings: Secondary | ICD-10-CM

## 2023-07-28 NOTE — Progress Notes (Signed)
 [facility]  ____________________________________________   None    (approximate)  I have reviewed the triage vital signs and the nursing notes.   HISTORY  Chief Complaint Annual Exam    HPI Jeffrey Maynard is a 38 y.o. male patient presents for annual physical exam.  Patient with no concerning complaints.         Past Medical History:  Diagnosis Date   Hypertension     Patient Active Problem List   Diagnosis Date Noted   Bradycardia 12/14/2022   OSA (obstructive sleep apnea) 12/14/2022   Encounter for drug screening 09/08/2022   Proteinuria 06/01/2022   Chronic kidney disease, stage 3b (HCC) 01/14/2021   Hypo-osmolality and hyponatremia 09/08/2020   Chronic kidney disease, stage IV (severe) (HCC) 04/16/2020   Periumbilical abdominal pain    PD catheter dysfunction (HCC)    Acute peritonitis (HCC) 03/05/2020   Essential hypertension 03/05/2020   Ileus (HCC) 03/05/2020   Anemia in chronic kidney disease 02/06/2020   Benign hypertensive kidney disease with chronic kidney disease 02/06/2020   Secondary hyperparathyroidism of renal origin (HCC) 02/06/2020   ESRD (end stage renal disease) (HCC) 10/26/2019   AKI (acute kidney injury) (HCC) 05/21/2019   Hypertensive emergency 05/21/2019   Hypokalemia 05/21/2019   Thrombocytopenia (HCC) 05/21/2019   Elevated troponin 05/21/2019    Past Surgical History:  Procedure Laterality Date   CAPD INSERTION N/A 05/24/2019   Procedure: LAPAROSCOPIC INSERTION CONTINUOUS AMBULATORY PERITONEAL DIALYSIS  (CAPD) CATHETER;  Surgeon: Celso College, MD;  Location: [facility];  Service: General;  Laterality: N/A;   XI ROBOT ASSISTED DIAGNOSTIC LAPAROSCOPY N/A 03/07/2020   Procedure: XI ROBOT ASSISTED DIAGNOSTIC LAPAROSCOPY-Catheter Removal;  Surgeon: Alben Alma, MD;  Location: [facility];  Service: General;  Laterality: N/A;    Prior to Admission medications   Medication Sig Start Date End Date  Taking? Authorizing Provider  amLODipine  (NORVASC ) 10 MG tablet Take 1 tablet (10 mg total) by mouth daily. 05/26/19  Yes Patel, Sona, MD  carvedilol  (COREG ) 25 MG tablet Take 1 tablet (25 mg total) by mouth 2 (two) times daily with a meal. 05/25/19  Yes Melvinia Stager, MD  irbesartan  (AVAPRO ) 150 MG tablet Take 150 mg by mouth every evening. 01/16/20  Yes [provider]  Vitamin D, Ergocalciferol, (DRISDOL) 1.25 MG (50000 UNIT) CAPS capsule Take 50,000 Units by mouth once a week. 04/03/23  Yes [provider]    Allergies Patient has no known allergies.  Family History  Problem Relation Age of Onset   COPD Mother     Social History Social History   Tobacco Use   Smoking status: Never   Smokeless tobacco: Never  Substance Use Topics   Alcohol use: No   Drug use: Never    Review of Systems Constitutional: No fever/chills Eyes: No visual changes. ENT: No sore throat. Cardiovascular: Denies chest pain. Respiratory: Denies shortness of breath. Gastrointestinal: No abdominal pain.  No nausea, no vomiting.  No diarrhea.  No constipation. Genitourinary: Negative for dysuria. Musculoskeletal: Negative for back pain. Skin: Negative for rash. Neurological: Negative for headaches, focal weakness or numbness.:  Endocrine: Hypertension   ____________________________________________   PHYSICAL EXAM:  VITAL SIGNS: BP 134/80  Cuff Size Large  Pulse Rate 58  Weight 185 lb (83.9 kg)  Height 5' 6 (1.676 m)  Resp 14  SpO2 97 %   BMI: 29.86 kg/m2  BSA: 1.98 m2   Constitutional: Alert and oriented. Well appearing and in  no acute distress. Eyes: Conjunctivae are normal. PERRL. EOMI. Head: Atraumatic. Nose: No congestion/rhinnorhea. Mouth/Throat: Mucous membranes are moist.  Oropharynx non-erythematous. Neck: No stridor.  No cervical spine tenderness to palpation. Hematological/Lymphatic/Immunilogical: No cervical lymphadenopathy. Cardiovascular: Normal rate,  regular rhythm. Grossly normal heart sounds.  Good peripheral circulation. Respiratory: Normal respiratory effort.  No retractions. Lungs CTAB. Gastrointestinal: Soft and nontender. No distention. No abdominal bruits. No CVA tenderness. Genitourinary: Deferred Musculoskeletal: No lower extremity tenderness nor edema.  No joint effusions. Neurologic:  Normal speech and language. No gross focal neurologic deficits are appreciated. No gait instability. Skin:  Skin is warm, dry and intact. No rash noted. Psychiatric: Mood and affect are normal. Speech and behavior are normal.  ____________________________________________   LABS         Component Ref Range & Units (hover) 6 d ago (07/22/23) 3 yr ago (03/05/20) 3 yr ago (02/12/20) 4 yr ago (05/21/19) 9 yr ago (07/30/14)  Color, UA Yellow      Clarity, UA Clear      Glucose, UA Negative      Bilirubin, UA Negative      Ketones, UA Negative      Spec Grav, UA 1.020      Blood, UA Positive      Comment: 1+  pH, UA 6.0      Protein, UA Positive Abnormal       Comment: 1+  Urobilinogen, UA 0.2      Nitrite, UA Negative      Leukocytes, UA Negative    NEGATIVE R  Appearance  HAZY Abnormal  R HAZY Abnormal  R CLEAR Abnormal  R CLEAR Abnormal  R  Odor                            Component Ref Range & Units (hover) 6 d ago (07/22/23) 3 yr ago (03/11/20) 3 yr ago (03/11/20) 3 yr ago (03/11/20) 3 yr ago (03/10/20) 3 yr ago (03/10/20) 3 yr ago (03/09/20) 3 yr ago (03/09/20) 3 yr ago (03/09/20)  Glucose 101 High  89 CM   90 CM   91 CM   Uric Acid 5.8          Comment:            Therapeutic target for gout patients: <6.0  BUN 21 High  31 High    27 High    28 High    Creatinine, Ser 1.65 High  2.66 High  R   2.47 High  R   2.63 High  R   eGFR 55 Low           BUN/Creatinine Ratio 13          Sodium 138 133 Low  R   133 Low  R   134 Low  R   Potassium 4.3 3.7 R   3.5 R   3.7 R   Chloride 104 102 R   101 R   101 R   Calcium 8.9 9.0 R   8.7  Low  R   8.8 Low  R   Phosphorus 2.6 Low   3.0 R, CM    2.7 R, CM    Total Protein 6.8          Albumin 4.2          Globulin, Total 2.6          Bilirubin Total 0.8  Alkaline Phosphatase 58          LDH 167          AST 18          ALT 19          GGT 16          Iron 139          Cholesterol, Total 151          Triglycerides 79          HDL 46          VLDL Cholesterol Cal 15          LDL Chol Calc (NIH) 90          Chol/HDL Ratio 3.3          Comment:                                   T. Chol/HDL Ratio                                             Men  Women                               1/2 Avg.Risk  3.4    3.3                                   Avg.Risk  5.0    4.4                                2X Avg.Risk  9.6    7.1                                3X Avg.Risk 23.4   11.0  Estimated CHD Risk  < 0.5          Comment: The CHD Risk is based on the T. Chol/HDL ratio. Other factors affect CHD Risk such as hypertension, smoking, diabetes, severe obesity, and family history of premature CHD.  TSH 2.490          T4, Total 7.8          T3 Uptake Ratio 25          Free Thyroxine Index 2.0          WBC 4.1   6.4 R     7.7 R  RBC 4.42   3.48 Low  R     3.03 Low  R  Hemoglobin 14.2   10.4 Low  R  9.1 Low  R   9.1 Low  R  Hematocrit 43.0   30.6 Low  R  27.8 Low  R, CM   27.4 Low  R  MCV 97   87.9 R     90.4 R  MCH 32.1   29.9 R     30.0 R  MCHC 33.0   34.0 R     33.2 R  RDW 11.8   12.3 R     12.4 R  Platelets 265  408 High  R     298 R  Neutrophils 49          Lymphs 37          Monocytes 10          Eos 2          Basos 1          Neutrophils Absolute 2.0          Lymphocytes Absolute 1.5          Monocytes Absolute 0.4          EOS (ABSOLUTE) 0.1          Basophils Absolute 0.0          Immature Granulocytes 0          Immature Grans (Abs) 0.0                        ____________________________________________    ____________________________________________   INITIAL IMPRESSION / ASSESSMENT AND PLAN  As part of my medical decision making, I reviewed the following data within the electronic MEDICAL RECORD NUMBER       No acute findings on physical exam and labs.      ____________________________________________   FINAL CLINICAL IMPRESSION  Well exam   ED Discharge Orders     None        Note:  This document was prepared using Dragon voice recognition software and may include unintentional dictation errors.

## 2023-07-28 NOTE — Progress Notes (Signed)
 Pt presents today to complete physical, Pt denies any issues or concerns at this time/CL,RMA
